# Patient Record
Sex: Female | Born: 1955 | Race: White | Hispanic: No | Marital: Married | Smoking: Never smoker
Health system: Southern US, Community
[De-identification: ages and names within clinical notes are randomized; demographics above are authoritative.]

## PROBLEM LIST (undated history)

## (undated) DIAGNOSIS — E785 Hyperlipidemia, unspecified: Secondary | ICD-10-CM

## (undated) DIAGNOSIS — D649 Anemia, unspecified: Secondary | ICD-10-CM

## (undated) DIAGNOSIS — D759 Disease of blood and blood-forming organs, unspecified: Secondary | ICD-10-CM

## (undated) DIAGNOSIS — C801 Malignant (primary) neoplasm, unspecified: Secondary | ICD-10-CM

## (undated) DIAGNOSIS — Z923 Personal history of irradiation: Secondary | ICD-10-CM

## (undated) DIAGNOSIS — F419 Anxiety disorder, unspecified: Secondary | ICD-10-CM

## (undated) DIAGNOSIS — F32A Depression, unspecified: Secondary | ICD-10-CM

## (undated) HISTORY — PX: CHOLECYSTECTOMY: SHX55

## (undated) HISTORY — DX: Hyperlipidemia, unspecified: E78.5

---

## 2004-05-07 ENCOUNTER — Ambulatory Visit (HOSPITAL_COMMUNITY): Admission: RE | Admit: 2004-05-07 | Discharge: 2004-05-07 | Payer: Self-pay | Admitting: Gastroenterology

## 2004-06-04 ENCOUNTER — Observation Stay (HOSPITAL_COMMUNITY): Admission: RE | Admit: 2004-06-04 | Discharge: 2004-06-05 | Payer: Self-pay | Admitting: General Surgery

## 2012-03-04 ENCOUNTER — Telehealth: Payer: Self-pay

## 2012-03-04 ENCOUNTER — Other Ambulatory Visit (HOSPITAL_COMMUNITY): Payer: Self-pay | Admitting: Obstetrics

## 2012-03-04 DIAGNOSIS — Z139 Encounter for screening, unspecified: Secondary | ICD-10-CM

## 2012-03-04 NOTE — Telephone Encounter (Signed)
LMOM to call.

## 2012-03-05 ENCOUNTER — Other Ambulatory Visit: Payer: Self-pay

## 2012-03-05 DIAGNOSIS — Z139 Encounter for screening, unspecified: Secondary | ICD-10-CM

## 2012-03-05 MED ORDER — PEG 3350-KCL-NA BICARB-NACL 420 G PO SOLR: ORAL | Status: AC

## 2012-03-05 NOTE — Telephone Encounter (Signed)
OK to proceed with colonoscopy.

## 2012-03-05 NOTE — Telephone Encounter (Signed)
Paper Rx and instructions mailed to pt. ( The electronic Rx was for trial to see if the machine was working.)

## 2012-03-05 NOTE — Telephone Encounter (Signed)
Gastroenterology Pre-Procedure Form   Request Date: 04/02/2012          Requesting Physician: Noland Fordyce, MD at [facility]     PATIENT INFORMATION:  Kelly Thomas is a 56 y.o., female (DOB=Aug 16, 1956).  PROCEDURE: Procedure(s) requested: colonoscopy Procedure Reason: screening for colon cancer  PATIENT REVIEW QUESTIONS: The patient reports the following:   1. Diabetes Melitis: no 2. Joint replacements in the past 12 months: no 3. Major health problems in the past 3 months: no 4. Has an artificial valve or MVP:no 5. Has been advised in past to take antibiotics in advance of a procedure like teeth cleaning: no}    MEDICATIONS & ALLERGIES:    Patient reports the following regarding taking any blood thinners:   Plavix? no Aspirin?no Coumadin?  no  Patient confirms/reports the following medications:  Current Outpatient Prescriptions  Medication Sig Dispense Refill  . NON FORMULARY Calcium 600 mg plus Vit D      One tablet daily        Patient confirms/reports the following allergies:  No Known Allergies  Patient is appropriate to schedule for requested procedure(s): yes  AUTHORIZATION INFORMATION Primary Insurance:   ID #:   Group #:  Pre-Cert / Auth required: Pre-Cert / Auth #:   Secondary Insurance:   ID #:  Group #:  Pre-Cert / Auth required:  Pre-Cert / Auth #:   No orders of the defined types were placed in this encounter.    SCHEDULE INFORMATION: Procedure has been scheduled as follows:  Date: 04/02/2012       Time: 11:30 AM  Location: [facility] Short Stay   This Gastroenterology Pre-Precedure Form is being routed to the following provider(s) for review: R. Roetta Sessions, MD

## 2012-03-09 ENCOUNTER — Ambulatory Visit (HOSPITAL_COMMUNITY)
Admission: RE | Admit: 2012-03-09 | Discharge: 2012-03-09 | Disposition: A | Discharge status: Home / Self Care | Payer: 59 | Source: Ambulatory Visit | Attending: Obstetrics | Admitting: Obstetrics

## 2012-03-09 DIAGNOSIS — Z139 Encounter for screening, unspecified: Secondary | ICD-10-CM

## 2012-03-09 DIAGNOSIS — Z1231 Encounter for screening mammogram for malignant neoplasm of breast: Secondary | ICD-10-CM | POA: Insufficient documentation

## 2012-03-31 ENCOUNTER — Encounter (HOSPITAL_COMMUNITY): Payer: Self-pay | Admitting: Pharmacy Technician

## 2012-04-01 MED ORDER — SODIUM CHLORIDE 0.45 % IV SOLN
Freq: Once | INTRAVENOUS | Status: AC
  Administered 2012-04-02: 1000 mL via INTRAVENOUS

## 2012-04-02 ENCOUNTER — Encounter (HOSPITAL_COMMUNITY): Payer: Self-pay | Admitting: *Deleted

## 2012-04-02 ENCOUNTER — Ambulatory Visit (HOSPITAL_COMMUNITY)
Admission: RE | Admit: 2012-04-02 | Discharge: 2012-04-02 | Disposition: A | Discharge status: Home / Self Care | Payer: 59 | Source: Ambulatory Visit | Attending: Internal Medicine | Admitting: Internal Medicine

## 2012-04-02 ENCOUNTER — Encounter (HOSPITAL_COMMUNITY)
Admission: RE | Disposition: A | Discharge status: Home / Self Care | Payer: Self-pay | Source: Ambulatory Visit | Attending: Internal Medicine

## 2012-04-02 DIAGNOSIS — K5730 Diverticulosis of large intestine without perforation or abscess without bleeding: Secondary | ICD-10-CM

## 2012-04-02 DIAGNOSIS — Z1211 Encounter for screening for malignant neoplasm of colon: Secondary | ICD-10-CM

## 2012-04-02 DIAGNOSIS — Z139 Encounter for screening, unspecified: Secondary | ICD-10-CM

## 2012-04-02 HISTORY — PX: COLONOSCOPY: SHX5424

## 2012-04-02 SURGERY — COLONOSCOPY
Anesthesia: Moderate Sedation

## 2012-04-02 MED ORDER — MEPERIDINE HCL 100 MG/ML IJ SOLN
INTRAMUSCULAR | Status: AC
  Filled 2012-04-02: qty 1

## 2012-04-02 MED ORDER — MIDAZOLAM HCL 5 MG/5ML IJ SOLN
INTRAMUSCULAR | Status: DC | PRN
  Administered 2012-04-02 (×2): 2 mg via INTRAVENOUS
  Administered 2012-04-02: 1 mg via INTRAVENOUS

## 2012-04-02 MED ORDER — STERILE WATER FOR IRRIGATION IR SOLN
Status: DC | PRN
  Administered 2012-04-02: 11:00:00

## 2012-04-02 MED ORDER — MEPERIDINE HCL 100 MG/ML IJ SOLN
INTRAMUSCULAR | Status: DC | PRN
  Administered 2012-04-02: 50 mg via INTRAVENOUS
  Administered 2012-04-02 (×2): 25 mg via INTRAVENOUS

## 2012-04-02 MED ORDER — MIDAZOLAM HCL 5 MG/5ML IJ SOLN
INTRAMUSCULAR | Status: AC
  Filled 2012-04-02: qty 10

## 2012-04-02 NOTE — Discharge Instructions (Addendum)
Colonoscopy Discharge Instructions  Read the instructions outlined below and refer to this sheet in the next few weeks. These discharge instructions provide you with general information on caring for yourself after you leave the hospital. Your doctor may also give you specific instructions. While your treatment has been planned according to the most current medical practices available, unavoidable complications occasionally occur. If you have any problems or questions after discharge, call Dr. Jena Gauss at [phone]. ACTIVITY  You may resume your regular activity, but move at a slower pace for the next 24 hours.   Take frequent rest periods for the next 24 hours.   Walking will help get rid of the air and reduce the bloated feeling in your belly (abdomen).   No driving for 24 hours (because of the medicine (anesthesia) used during the test).    Do not sign any important legal documents or operate any machinery for 24 hours (because of the anesthesia used during the test).  NUTRITION  Drink plenty of fluids.   You may resume your normal diet as instructed by your doctor.   Begin with a light meal and progress to your normal diet. Heavy or fried foods are harder to digest and may make you feel sick to your stomach (nauseated).   Avoid alcoholic beverages for 24 hours or as instructed.  MEDICATIONS  You may resume your normal medications unless your doctor tells you otherwise.  WHAT YOU CAN EXPECT TODAY  Some feelings of bloating in the abdomen.   Passage of more gas than usual.   Spotting of blood in your stool or on the toilet paper.  IF YOU HAD POLYPS REMOVED DURING THE COLONOSCOPY:  No aspirin products for 7 days or as instructed.   No alcohol for 7 days or as instructed.   Eat a soft diet for the next 24 hours.  FINDING OUT THE RESULTS OF YOUR TEST Not all test results are available during your visit. If your test results are not back during the visit, make an appointment  with your caregiver to find out the results. Do not assume everything is normal if you have not heard from your caregiver or the medical facility. It is important for you to follow up on all of your test results.  SEEK IMMEDIATE MEDICAL ATTENTION IF:  You have more than a spotting of blood in your stool.   Your belly is swollen (abdominal distention).   You are nauseated or vomiting.   You have a temperature over 101.   You have abdominal pain or discomfort that is severe or gets worse throughout the day.   Diverticulosis information provided. Repeat screening colonoscopy in 10 years. Diverticulosis is a common condition that develops when small pouches (diverticula) form in the wall of the colon. The risk of diverticulosis increases with age. It happens more often in people who eat a low-fiber diet. Most individuals with diverticulosis have no symptoms. Those individuals with symptoms usually experience abdominal pain, constipation, or loose stools (diarrhea). HOME CARE INSTRUCTIONS   Increase the amount of fiber in your diet as directed by your caregiver or dietician. This may reduce symptoms of diverticulosis.   Your caregiver may recommend taking a dietary fiber supplement.   Drink at least 6 to 8 glasses of water each day to prevent constipation.   Try not to strain when you have a bowel movement.   Your caregiver may recommend avoiding nuts and seeds to prevent complications, although this is still an uncertain benefit.  Only take over-the-counter or prescription medicines for pain, discomfort, or fever as directed by your caregiver.  FOODS WITH HIGH FIBER CONTENT INCLUDE:  Fruits. Apple, peach, pear, tangerine, raisins, prunes.   Vegetables. Brussels sprouts, asparagus, broccoli, cabbage, carrot, cauliflower, romaine lettuce, spinach, summer squash, tomato, winter squash, zucchini.   Starchy Vegetables. Baked beans, kidney beans, lima beans, split peas, lentils, potatoes  (with skin).   Grains. Whole wheat bread, brown rice, bran flake cereal, plain oatmeal, white rice, shredded wheat, bran muffins.  SEEK IMMEDIATE MEDICAL CARE IF:   You develop increasing pain or severe bloating.   You have an oral temperature above 102 F (38.9 C), not controlled by medicine.   You develop vomiting or bowel movements that are bloody or black.  Document Released: 08/01/2004 Document Revised: 10/24/2011 Document Reviewed: 04/04/2010 [facility] Patient Information 2012 [place], [place].

## 2012-04-02 NOTE — H&P (Signed)
  Primary Care Physician:  Lendon Colonel., MD, MD Primary Gastroenterologist:  Dr. Jena Gauss  Pre-Procedure History & Physical: HPI:  ALDENE HENDON is a 56 y.o. female is here for a screening colonoscopy.   First ever average risk screening examination. Patient denies any bowel symptoms. No family history polyps or colon cancer.  History reviewed. No pertinent past medical history.  Past Surgical History  Procedure Date  . Cholecystectomy     Prior to Admission medications   Medication Sig Start Date End Date Taking? Authorizing Provider  calcium-vitamin D (OSCAL WITH D) 500-200 MG-UNIT per tablet Take 1 tablet by mouth daily.   Yes Historical Provider, MD    Allergies as of 03/05/2012  . (No Known Allergies)    History reviewed. No pertinent family history.  History   Social History  . Marital Status: Married    Spouse Name: N/A    Number of Children: N/A  . Years of Education: N/A   Occupational History  . Not on file.   Social History Main Topics  . Smoking status: Never Smoker   . Smokeless tobacco: Not on file  . Alcohol Use: No  . Drug Use: Yes  . Sexually Active: Yes   Other Topics Concern  . Not on file   Social History Narrative  . No narrative on file    Review of Systems: See HPI, otherwise negative ROS  Physical Exam: BP 138/86  Pulse 95  Temp(Src) 98.1 F (36.7 C) (Oral)  Resp 22  Ht 5\' 2"  (1.575 m)  Wt 155 lb (70.308 kg)  BMI 28.35 kg/m2  SpO2 96% General:   Alert,  Well-developed, well-nourished, pleasant and cooperative in NAD Head:  Normocephalic and atraumatic. Eyes:  Sclera clear, no icterus.   Conjunctiva pink. Ears:  Normal auditory acuity. Nose:  No deformity, discharge,  or lesions. Mouth:  No deformity or lesions, dentition normal. Neck:  Supple; no masses or thyromegaly. Lungs:  Clear throughout to auscultation.   No wheezes, crackles, or rhonchi. No acute distress. Heart:  Regular rate and rhythm; no murmurs, clicks,  rubs,  or gallops. Abdomen:  Soft, nontender and nondistended. No masses, hepatosplenomegaly or hernias noted. Normal bowel sounds, without guarding, and without rebound.   Msk:  Symmetrical without gross deformities. Normal posture. Pulses:  Normal pulses noted. Extremities:  Without clubbing or edema. Neurologic:  Alert and  oriented x4;  grossly normal neurologically. Skin:  Intact without significant lesions or rashes. Cervical Nodes:  No significant cervical adenopathy. Psych:  Alert and cooperative. Normal mood and affect.  Impression/Plan: NORRINE BALLESTER is now here to undergo a screening colonoscopy.  First ever average risk screening examination.  The risks, benefits, limitations, imponderables and alternatives regarding colonoscopy have been reviewed with the patient. Questions have been answered. All parties agreeable.

## 2012-04-02 NOTE — Op Note (Signed)
[facility] [address] [place], [place]  [address]  COLONOSCOPY PROCEDURE REPORT  PATIENT:  Kelly Thomas, Kelly Thomas  MR#:  [id number] BIRTHDATE:  [birth year], 56 yrs. old  GENDER:  female ENDOSCOPIST:  R. Roetta Sessions, MD FACP [facility] REF. BY:  Noland Fordyce, M.D. PROCEDURE DATE:  04/02/2012 PROCEDURE:  screening ileocolonoscopy  INDICATIONS:  First ever average risk screening examination  INFORMED CONSENT:  The risks, benefits, alternatives and imponderables including but not limited to bleeding, perforation as well as the possibility of a missed lesion have been reviewed. The potential for biopsy, lesion removal, etc. have also been discussed.  Questions have been answered.  All parties agreeable. Please see the history and physical in the medical record for more information.  MEDICATIONS:  Versed 5 mg IV and Demerol 100 mg IV in divided doses.  DESCRIPTION OF PROCEDURE:  After a digital rectal exam was performed, the EC-[id number] ([id number]) colonoscope was advanced from the anus through the rectum and colon to the area of the cecum, ileocecal valve and appendiceal orifice.  The cecum was deeply intubated.  These structures were well-seen and photographed for the record.  From the level of the cecum and ileocecal valve, the scope was slowly and cautiously withdrawn.  The mucosal surfaces were carefully surveyed utilizing scope tip deflection to facilitate fold flattening as needed.  The scope was pulled down into the rectum where a thorough examination including retroflexion was performed. <<PROCEDUREIMAGES>>  FINDINGS:  adequate preparation. Normal rectum. Scattered sigmoid and descending diverticula; remainder of the colonic mucosa and distal 5 cm of terminal ileum appeared normal.  THERAPEUTIC / DIAGNOSTIC MANEUVERS PERFORMED:  none  COMPLICATIONS:   none  CECAL WITHDRAWAL TIME:    8 minutes  IMPRESSION:    Colonic diverticulosis  RECOMMENDATIONS:   Repeat  screening colonoscopy in 10 years  ______________________________ R. Roetta Sessions, MD Caleen Essex  CC:  Noland Fordyce, M.D.  n. eSIGNED:   R. Roetta Sessions at 04/02/2012 11:45 AM  Verl Blalock, [phone]

## 2012-04-06 ENCOUNTER — Encounter (HOSPITAL_COMMUNITY): Payer: Self-pay | Admitting: Internal Medicine

## 2013-03-18 ENCOUNTER — Other Ambulatory Visit (HOSPITAL_COMMUNITY): Payer: Self-pay | Admitting: Obstetrics

## 2013-03-18 DIAGNOSIS — Z139 Encounter for screening, unspecified: Secondary | ICD-10-CM

## 2013-03-23 ENCOUNTER — Ambulatory Visit (HOSPITAL_COMMUNITY)
Admission: RE | Admit: 2013-03-23 | Discharge: 2013-03-23 | Disposition: A | Discharge status: Home / Self Care | Payer: 59 | Source: Ambulatory Visit | Attending: Obstetrics | Admitting: Obstetrics

## 2013-03-23 DIAGNOSIS — Z139 Encounter for screening, unspecified: Secondary | ICD-10-CM

## 2013-03-23 DIAGNOSIS — Z1231 Encounter for screening mammogram for malignant neoplasm of breast: Secondary | ICD-10-CM | POA: Insufficient documentation

## 2013-03-24 ENCOUNTER — Other Ambulatory Visit: Payer: Self-pay | Admitting: Obstetrics

## 2013-03-24 DIAGNOSIS — R928 Other abnormal and inconclusive findings on diagnostic imaging of breast: Secondary | ICD-10-CM

## 2013-04-07 ENCOUNTER — Ambulatory Visit (HOSPITAL_COMMUNITY)
Admission: RE | Admit: 2013-04-07 | Discharge: 2013-04-07 | Disposition: A | Discharge status: Home / Self Care | Payer: 59 | Source: Ambulatory Visit | Attending: Obstetrics | Admitting: Obstetrics

## 2013-04-07 DIAGNOSIS — R928 Other abnormal and inconclusive findings on diagnostic imaging of breast: Secondary | ICD-10-CM

## 2014-02-16 ENCOUNTER — Ambulatory Visit (HOSPITAL_COMMUNITY)
Admission: RE | Admit: 2014-02-16 | Discharge: 2014-02-16 | Disposition: A | Discharge status: Home / Self Care | Payer: 59 | Source: Ambulatory Visit | Attending: Family Medicine | Admitting: Family Medicine

## 2014-02-16 ENCOUNTER — Other Ambulatory Visit (HOSPITAL_COMMUNITY): Payer: Self-pay | Admitting: Family Medicine

## 2014-02-16 DIAGNOSIS — M545 Low back pain, unspecified: Secondary | ICD-10-CM | POA: Insufficient documentation

## 2014-02-16 DIAGNOSIS — M47817 Spondylosis without myelopathy or radiculopathy, lumbosacral region: Secondary | ICD-10-CM | POA: Insufficient documentation

## 2014-02-16 DIAGNOSIS — M25551 Pain in right hip: Secondary | ICD-10-CM

## 2014-02-16 DIAGNOSIS — M25559 Pain in unspecified hip: Secondary | ICD-10-CM | POA: Insufficient documentation

## 2014-05-18 ENCOUNTER — Other Ambulatory Visit (HOSPITAL_COMMUNITY): Payer: Self-pay | Admitting: Obstetrics

## 2014-05-18 DIAGNOSIS — Z1231 Encounter for screening mammogram for malignant neoplasm of breast: Secondary | ICD-10-CM

## 2014-05-23 ENCOUNTER — Ambulatory Visit (HOSPITAL_COMMUNITY)
Admission: RE | Admit: 2014-05-23 | Discharge: 2014-05-23 | Disposition: A | Discharge status: Home / Self Care | Payer: 59 | Source: Ambulatory Visit | Attending: Obstetrics | Admitting: Obstetrics

## 2014-05-23 DIAGNOSIS — Z1231 Encounter for screening mammogram for malignant neoplasm of breast: Secondary | ICD-10-CM

## 2014-11-09 ENCOUNTER — Emergency Department (HOSPITAL_COMMUNITY)
Admission: EM | Admit: 2014-11-09 | Discharge: 2014-11-09 | Disposition: A | Discharge status: Home / Self Care | Payer: 59 | Attending: Emergency Medicine | Admitting: Emergency Medicine

## 2014-11-09 ENCOUNTER — Emergency Department (HOSPITAL_COMMUNITY): Payer: 59

## 2014-11-09 ENCOUNTER — Encounter (HOSPITAL_COMMUNITY): Payer: Self-pay

## 2014-11-09 DIAGNOSIS — M5416 Radiculopathy, lumbar region: Secondary | ICD-10-CM

## 2014-11-09 DIAGNOSIS — M79604 Pain in right leg: Secondary | ICD-10-CM | POA: Diagnosis not present

## 2014-11-09 DIAGNOSIS — M25551 Pain in right hip: Secondary | ICD-10-CM | POA: Diagnosis present

## 2014-11-09 DIAGNOSIS — R52 Pain, unspecified: Secondary | ICD-10-CM

## 2014-11-09 LAB — CBC WITH DIFFERENTIAL/PLATELET
BASOS PCT: 0 % (ref 0–1)
Basophils Absolute: 0 10*3/uL (ref 0.0–0.1)
EOS ABS: 0 10*3/uL (ref 0.0–0.7)
EOS PCT: 0 % (ref 0–5)
HCT: 39.3 % (ref 36.0–46.0)
HEMOGLOBIN: 13.1 g/dL (ref 12.0–15.0)
LYMPHS ABS: 1.1 10*3/uL (ref 0.7–4.0)
Lymphocytes Relative: 13 % (ref 12–46)
MCH: 30.7 pg (ref 26.0–34.0)
MCHC: 33.3 g/dL (ref 30.0–36.0)
MCV: 92 fL (ref 78.0–100.0)
MONO ABS: 0.2 10*3/uL (ref 0.1–1.0)
MONOS PCT: 2 % — AB (ref 3–12)
NEUTROS PCT: 85 % — AB (ref 43–77)
Neutro Abs: 6.7 10*3/uL (ref 1.7–7.7)
Platelets: 259 10*3/uL (ref 150–400)
RBC: 4.27 MIL/uL (ref 3.87–5.11)
RDW: 13.1 % (ref 11.5–15.5)
WBC: 8 10*3/uL (ref 4.0–10.5)

## 2014-11-09 LAB — BASIC METABOLIC PANEL
Anion gap: 5 (ref 5–15)
BUN: 13 mg/dL (ref 6–23)
CHLORIDE: 106 meq/L (ref 96–112)
CO2: 27 mmol/L (ref 19–32)
CREATININE: 0.98 mg/dL (ref 0.50–1.10)
Calcium: 9.1 mg/dL (ref 8.4–10.5)
GFR calc non Af Amer: 63 mL/min — ABNORMAL LOW (ref >90)
GFR, EST AFRICAN AMERICAN: 73 mL/min — AB (ref >90)
GLUCOSE: 130 mg/dL — AB (ref 70–99)
Potassium: 3.9 mmol/L (ref 3.5–5.1)
Sodium: 138 mmol/L (ref 135–145)

## 2014-11-09 MED ORDER — HYDROMORPHONE HCL 1 MG/ML IJ SOLN
1.0000 mg | Freq: Once | INTRAMUSCULAR | Status: AC
  Administered 2014-11-09: 1 mg via INTRAVENOUS
  Filled 2014-11-09: qty 1

## 2014-11-09 MED ORDER — METHOCARBAMOL 1000 MG/10ML IJ SOLN
INTRAMUSCULAR | Status: AC
  Filled 2014-11-09: qty 10

## 2014-11-09 MED ORDER — ONDANSETRON HCL 4 MG/2ML IJ SOLN
4.0000 mg | Freq: Once | INTRAMUSCULAR | Status: AC
  Administered 2014-11-09: 4 mg via INTRAVENOUS
  Filled 2014-11-09: qty 2

## 2014-11-09 MED ORDER — IBUPROFEN 800 MG PO TABS
800.0000 mg | ORAL_TABLET | Freq: Once | ORAL | Status: AC
  Administered 2014-11-09: 800 mg via ORAL
  Filled 2014-11-09: qty 1

## 2014-11-09 MED ORDER — CYCLOBENZAPRINE HCL 10 MG PO TABS
10.0000 mg | ORAL_TABLET | Freq: Once | ORAL | Status: AC
  Administered 2014-11-09: 10 mg via ORAL
  Filled 2014-11-09: qty 1

## 2014-11-09 MED ORDER — OXYCODONE-ACETAMINOPHEN 5-325 MG PO TABS: 1.0000 | ORAL_TABLET | Freq: Four times a day (QID) | ORAL | Status: DC | PRN

## 2014-11-09 MED ORDER — METHOCARBAMOL 1000 MG/10ML IJ SOLN
1000.0000 mg | Freq: Once | INTRAMUSCULAR | Status: AC
  Administered 2014-11-09: 1000 mg via INTRAVENOUS
  Filled 2014-11-09: qty 10

## 2014-11-09 MED ORDER — IBUPROFEN 800 MG PO TABS: 800.0000 mg | ORAL_TABLET | Freq: Three times a day (TID) | ORAL | Status: DC

## 2014-11-09 MED ORDER — OXYCODONE-ACETAMINOPHEN 5-325 MG PO TABS
1.0000 | ORAL_TABLET | Freq: Once | ORAL | Status: AC
Start: 2014-11-09 — End: 2014-11-09
  Administered 2014-11-09: 1 via ORAL
  Filled 2014-11-09: qty 1

## 2014-11-09 MED ORDER — OXYCODONE-ACETAMINOPHEN 5-325 MG PO TABS
1.0000 | ORAL_TABLET | Freq: Once | ORAL | Status: AC
  Administered 2014-11-09: 1 via ORAL
  Filled 2014-11-09: qty 1

## 2014-11-09 NOTE — Discharge Instructions (Signed)
Sciatica Call Dr.Botero's office today to schedule an appointment. His office may want a referral from your primary care physician. Take the medication prescribed as needed for pain. Ibuprofen will help with inflammation. Take ibuprofen with food. If you need refills for prescriptions,  Please Call Dr. Lorenso CourierMcinnis's office. Sciatica is pain, weakness, numbness, or tingling along your sciatic nerve. The nerve starts in the lower back and runs down the back of each leg. Nerve damage or certain conditions pinch or put pressure on the sciatic nerve. This causes the pain, weakness, and other discomforts of sciatica. HOME CARE   Only take medicine as told by your doctor.  Apply ice to the affected area for 20 minutes. Do this 3-4 times a day for the first 48-72 hours. Then try heat in the same way.  Exercise, stretch, or do your usual activities if these do not make your pain worse.  Go to physical therapy as told by your doctor.  Keep all doctor visits as told.  Do not wear high heels or shoes that are not supportive.  Get a firm mattress if your mattress is too soft to lessen pain and discomfort. GET HELP RIGHT AWAY IF:   You cannot control when you poop (bowel movement) or pee (urinate).  You have more weakness in your lower back, lower belly (pelvis), butt (buttocks), or legs.  You have redness or puffiness (swelling) of your back.  You have a burning feeling when you pee.  You have pain that gets worse when you lie down.  You have pain that wakes you from your sleep.  Your pain is worse than past pain.  Your pain lasts longer than 4 weeks.  You are suddenly losing weight without reason. MAKE SURE YOU:   Understand these instructions.  Will watch this condition.  Will get help right away if you are not doing well or get worse. Document Released: 08/13/2008 Document Revised: 05/05/2012 Document Reviewed: 03/15/2012 [facility] Patient Information 2015 [place]. This  information is not intended to replace advice given to you by your health care provider. Make sure you discuss any questions you have with your health care provider.

## 2014-11-09 NOTE — ED Notes (Signed)
Pt bent over to pick up the vacuum cleaner yesterday and "felt something happen in my right hip"  Pt denies feeling a pop or a pull, but has been having severe pain from right hip and down right anterior thigh.

## 2014-11-09 NOTE — ED Provider Notes (Signed)
CSN: [id number]     Arrival date & time 11/09/14  0227 History   First MD Initiated Contact with Patient 11/09/14 0345     Chief Complaint  Patient presents with  . Hip Pain     (Consider location/radiation/quality/duration/timing/severity/associated sxs/prior Treatment) Patient is a 58 y.o. female presenting with hip pain. The history is provided by the patient.  Hip Pain  She had sudden onset of pain in the right hip radiating to the right thigh. This occurred yesterday when she bent over to pick up a vacuum cleaner. Pain is severe and she rates it 10/10. She describes the pain as sharp. Nothing makes it better nothing makes it worse and she cannot get comfortable. She denies any weakness, numbness, telling. She denies bowel or bladder dysfunction. 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 She is not taken anything at home for pain.  History reviewed. No pertinent past medical history. Past Surgical History  Procedure Laterality Date  . Cholecystectomy    . Colonoscopy  04/02/2012    Procedure: COLONOSCOPY;  Surgeon: Corbin Adeobert M Rourk, MD;  Location: [facility];  Service: Endoscopy;  Laterality: N/A;  11:30 AM   No family history on file. History  Substance Use Topics  . Smoking status: Never Smoker   . Smokeless tobacco: Not on file  . Alcohol Use: No   OB History    No data available     Review of Systems  All other systems reviewed and are negative.     Allergies  Review of patient's allergies indicates no known allergies.  Home Medications   Prior to Admission medications   Medication Sig Start Date End Date Taking? Authorizing Provider  calcium-vitamin D (OSCAL WITH D) 500-200 MG-UNIT per tablet Take 1 tablet by mouth daily.   Yes Historical Provider, MD   BP 135/83 mmHg  Pulse 83  Temp(Src) 98.2 F (36.8 C) (Oral)  Resp 20  Ht 5\' 5"  (1.651 m)  Wt 150 lb (68.04 kg)  BMI 24.96 kg/m2  SpO2 100% Physical  Exam  Nursing note and vitals reviewed.  58 year old female, who is uncomfortable and in pain, but his in no acute distress. Vital signs are normal. Oxygen saturation is 100%, which is normal. Head is normocephalic and atraumatic. PERRLA, EOMI. Oropharynx is clear. Neck is nontender and supple without adenopathy or JVD. Back is nontender and there is no CVA tenderness. Mild right paralumbar spasm is present. Straight leg raise is positive on the right at 60. Lungs are clear without rales, wheezes, or rhonchi. Chest is nontender. Heart has regular rate and rhythm without murmur. Abdomen is soft, flat, nontender without masses or hepatosplenomegaly and peristalsis is normoactive. Extremities have no cyanosis or edema, full range of motion is present. There is mild tenderness to palpation in the right hemipelvis and hip area without any point tenderness. Skin is warm and dry without rash. Neurologic: Mental status is normal, cranial nerves are intact, there are no motor or sensory deficits.  ED Course  Procedures (including critical care time)  Imaging Review Dg Hip Complete Right  11/09/2014   CLINICAL DATA:  Severe right hip pain, acute onset. Initial encounter.  EXAM: RIGHT HIP - COMPLETE 2+ VIEW  COMPARISON:  Right hip radiograph performed 02/16/2014  FINDINGS: There is no evidence of fracture or dislocation. Both femoral heads are seated normally within their respective acetabula. The proximal right femur appears intact. No  significant degenerative change is appreciated. The sacroiliac joints are unremarkable in appearance.  The visualized bowel gas pattern is grossly unremarkable in appearance. Scattered phleboliths are noted within the pelvis.  IMPRESSION: No evidence of fracture or dislocation. No radiographic evidence for osteoarthritis.   Electronically Signed   By: Roanna RaiderJeffery  Chang M.D.   On: 11/09/2014 03:52   Images viewed by me.  MDM   Final diagnoses:  Pain  Leg pain,  anterior, right    Right hip and thigh pain in pattern suggestive of sciatica. No evidence of fracture on x-ray and full passive range of motion is present in the hip without significant pain. She is given a dose of oxycodone acetaminophen, cyclobenzaprine, and ibuprofen and will be reassessed.  She had no relief with above noted medication. Oxycodone acetaminophen was repeated with no relief. IV was started and she was given hydromorphone for pain with only modest relief. Hydromorphone was repeated with fairly good relief from. She was also given intravenous methocarbamol. Because of difficulty in achieving pain control, was elected to send her for MRI scan. Case is endorsed to Dr. Ethelda ChickJacubowitz to evaluate results  Dione Boozeavid Kamille Toomey, MD 11/09/14 [phone]

## 2014-11-09 NOTE — ED Notes (Signed)
Pt at MRI

## 2014-11-09 NOTE — ED Provider Notes (Signed)
8:30 AM patient's pain is much improved after treatment with intravenous opioids she feels ready to go home. She is able to walk with slight limp favoring right leg. Plan prescription ibuprofen, Percocet referral Dr.Botero Results for orders placed or performed during the hospital encounter of 11/09/14  Basic metabolic panel  Result Value Ref Range   Sodium 138 135 - 145 mmol/L   Potassium 3.9 3.5 - 5.1 mmol/L   Chloride 106 96 - 112 mEq/L   CO2 27 19 - 32 mmol/L   Glucose, Bld 130 (H) 70 - 99 mg/dL   BUN 13 6 - 23 mg/dL   Creatinine, Ser 1.610.98 0.50 - 1.10 mg/dL   Calcium 9.1 8.4 - 09.610.5 mg/dL   GFR calc non Af Amer 63 (L) >90 mL/min   GFR calc Af Amer 73 (L) >90 mL/min   Anion gap 5 5 - 15  CBC with Differential  Result Value Ref Range   WBC 8.0 4.0 - 10.5 K/uL   RBC 4.27 3.87 - 5.11 MIL/uL   Hemoglobin 13.1 12.0 - 15.0 g/dL   HCT 04.539.3 40.936.0 - 81.146.0 %   MCV 92.0 78.0 - 100.0 fL   MCH 30.7 26.0 - 34.0 pg   MCHC 33.3 30.0 - 36.0 g/dL   RDW 91.413.1 78.211.5 - 95.615.5 %   Platelets 259 150 - 400 K/uL   Neutrophils Relative % 85 (H) 43 - 77 %   Neutro Abs 6.7 1.7 - 7.7 K/uL   Lymphocytes Relative 13 12 - 46 %   Lymphs Abs 1.1 0.7 - 4.0 K/uL   Monocytes Relative 2 (L) 3 - 12 %   Monocytes Absolute 0.2 0.1 - 1.0 K/uL   Eosinophils Relative 0 0 - 5 %   Eosinophils Absolute 0.0 0.0 - 0.7 K/uL   Basophils Relative 0 0 - 1 %   Basophils Absolute 0.0 0.0 - 0.1 K/uL   Dg Hip Complete Right  11/09/2014   CLINICAL DATA:  Severe right hip pain, acute onset. Initial encounter.  EXAM: RIGHT HIP - COMPLETE 2+ VIEW  COMPARISON:  Right hip radiograph performed 02/16/2014  FINDINGS: There is no evidence of fracture or dislocation. Both femoral heads are seated normally within their respective acetabula. The proximal right femur appears intact. No significant degenerative change is appreciated. The sacroiliac joints are unremarkable in appearance.  The visualized bowel gas pattern is grossly unremarkable in  appearance. Scattered phleboliths are noted within the pelvis.  IMPRESSION: No evidence of fracture or dislocation. No radiographic evidence for osteoarthritis.   Electronically Signed   By: Roanna RaiderJeffery  Chang M.D.   On: 11/09/2014 03:52   Mr Lumbar Spine Wo Contrast  11/09/2014   CLINICAL DATA:  Acute onset of severe right hip and anterior leg pain yesterday.  EXAM: MRI LUMBAR SPINE WITHOUT CONTRAST  TECHNIQUE: Multiplanar, multisequence MR imaging of the lumbar spine was performed. No intravenous contrast was administered.  COMPARISON:  Lumbar spine radiographs 02/16/2014. CT abdomen and pelvis 05/07/2004.  FINDINGS: Lowest fully formed intervertebral disc space is labeled L5-S1.  Vertebral alignment is within normal limits. Vertebral body heights are preserved. Right-sided S1 hemangioma is noted. 5 mm T2 hyperintense focus in the anterior L5 vertebral body may represent an atypical hemangioma. There is no evidence of significant vertebral marrow edema. Diffuse lumbar disc desiccation is present. Conus medullaris is normal in signal and terminates at L1. Incidental note is made of a circumaortic left renal vein.  L1-2:  Negative.  L2-3: Small right foraminal disc  extrusion results in moderate right neural foraminal stenosis laterally and appears to contact the exiting right L2 nerve root. No spinal canal or left neural foraminal stenosis.  L3-4:  Mild disc bulging without stenosis.  L4-5: Mild disc bulging with left foraminal annular fissure and mild facet hypertrophy result in minimal left neural foraminal narrowing. No spinal canal stenosis.  L5-S1:  Mild facet arthrosis without stenosis.  IMPRESSION: 1. Right foraminal disc extrusion at L2-3 likely affecting the right L2 nerve. 2. 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   Electronically Signed   By: Sebastian AcheAllen  Grady   On: 11/09/2014 08:04   Dx Lumbar radiculpathy   Doug SouSam Donat Humble, MD 11/09/14 [phone]

## 2015-05-18 ENCOUNTER — Other Ambulatory Visit (HOSPITAL_COMMUNITY): Payer: Self-pay | Admitting: Obstetrics

## 2015-05-18 DIAGNOSIS — Z1231 Encounter for screening mammogram for malignant neoplasm of breast: Secondary | ICD-10-CM

## 2015-05-26 ENCOUNTER — Ambulatory Visit (HOSPITAL_COMMUNITY)
Admission: RE | Admit: 2015-05-26 | Discharge: 2015-05-26 | Disposition: A | Discharge status: Home / Self Care | Payer: 59 | Source: Ambulatory Visit | Attending: Obstetrics | Admitting: Obstetrics

## 2015-05-26 DIAGNOSIS — Z1231 Encounter for screening mammogram for malignant neoplasm of breast: Secondary | ICD-10-CM | POA: Diagnosis not present

## 2016-05-28 ENCOUNTER — Other Ambulatory Visit (HOSPITAL_COMMUNITY): Payer: Self-pay | Admitting: Obstetrics

## 2016-05-28 ENCOUNTER — Other Ambulatory Visit: Payer: Self-pay | Admitting: Physical Therapy

## 2016-05-28 DIAGNOSIS — Z1231 Encounter for screening mammogram for malignant neoplasm of breast: Secondary | ICD-10-CM

## 2016-05-30 ENCOUNTER — Ambulatory Visit (HOSPITAL_COMMUNITY)
Admission: RE | Admit: 2016-05-30 | Discharge: 2016-05-30 | Disposition: A | Discharge status: Home / Self Care | Payer: 59 | Source: Ambulatory Visit | Attending: Obstetrics | Admitting: Obstetrics

## 2016-05-30 DIAGNOSIS — Z1231 Encounter for screening mammogram for malignant neoplasm of breast: Secondary | ICD-10-CM | POA: Diagnosis present

## 2018-01-05 DIAGNOSIS — E785 Hyperlipidemia, unspecified: Secondary | ICD-10-CM | POA: Diagnosis not present

## 2018-01-07 DIAGNOSIS — R944 Abnormal results of kidney function studies: Secondary | ICD-10-CM | POA: Diagnosis not present

## 2018-01-07 DIAGNOSIS — Z1331 Encounter for screening for depression: Secondary | ICD-10-CM | POA: Diagnosis not present

## 2018-03-25 ENCOUNTER — Other Ambulatory Visit (HOSPITAL_COMMUNITY): Payer: Self-pay | Admitting: Internal Medicine

## 2018-03-25 DIAGNOSIS — Z1231 Encounter for screening mammogram for malignant neoplasm of breast: Secondary | ICD-10-CM

## 2018-03-30 ENCOUNTER — Ambulatory Visit (HOSPITAL_COMMUNITY)
Admission: RE | Admit: 2018-03-30 | Discharge: 2018-03-30 | Disposition: A | Discharge status: Home / Self Care | Payer: BLUE CROSS/BLUE SHIELD | Source: Ambulatory Visit | Attending: Internal Medicine | Admitting: Internal Medicine

## 2018-03-30 DIAGNOSIS — Z1231 Encounter for screening mammogram for malignant neoplasm of breast: Secondary | ICD-10-CM | POA: Diagnosis not present

## 2018-06-30 DIAGNOSIS — Z1331 Encounter for screening for depression: Secondary | ICD-10-CM | POA: Diagnosis not present

## 2018-06-30 DIAGNOSIS — Z6830 Body mass index (BMI) 30.0-30.9, adult: Secondary | ICD-10-CM | POA: Diagnosis not present

## 2018-06-30 DIAGNOSIS — E785 Hyperlipidemia, unspecified: Secondary | ICD-10-CM | POA: Diagnosis not present

## 2018-06-30 DIAGNOSIS — R944 Abnormal results of kidney function studies: Secondary | ICD-10-CM | POA: Diagnosis not present

## 2018-06-30 DIAGNOSIS — R7301 Impaired fasting glucose: Secondary | ICD-10-CM | POA: Diagnosis not present

## 2018-07-07 DIAGNOSIS — R7301 Impaired fasting glucose: Secondary | ICD-10-CM | POA: Diagnosis not present

## 2018-07-07 DIAGNOSIS — M5430 Sciatica, unspecified side: Secondary | ICD-10-CM | POA: Diagnosis not present

## 2018-07-07 DIAGNOSIS — R944 Abnormal results of kidney function studies: Secondary | ICD-10-CM | POA: Diagnosis not present

## 2018-07-07 DIAGNOSIS — E782 Mixed hyperlipidemia: Secondary | ICD-10-CM | POA: Diagnosis not present

## 2019-01-08 DIAGNOSIS — R944 Abnormal results of kidney function studies: Secondary | ICD-10-CM | POA: Diagnosis not present

## 2019-01-08 DIAGNOSIS — R7301 Impaired fasting glucose: Secondary | ICD-10-CM | POA: Diagnosis not present

## 2019-01-08 DIAGNOSIS — E782 Mixed hyperlipidemia: Secondary | ICD-10-CM | POA: Diagnosis not present

## 2019-01-08 DIAGNOSIS — E785 Hyperlipidemia, unspecified: Secondary | ICD-10-CM | POA: Diagnosis not present

## 2019-01-13 ENCOUNTER — Other Ambulatory Visit (HOSPITAL_COMMUNITY): Payer: Self-pay | Admitting: Internal Medicine

## 2019-01-13 DIAGNOSIS — Z0000 Encounter for general adult medical examination without abnormal findings: Secondary | ICD-10-CM | POA: Diagnosis not present

## 2019-01-13 DIAGNOSIS — M5430 Sciatica, unspecified side: Secondary | ICD-10-CM | POA: Diagnosis not present

## 2019-01-13 DIAGNOSIS — N183 Chronic kidney disease, stage 3 (moderate): Secondary | ICD-10-CM | POA: Diagnosis not present

## 2019-01-13 DIAGNOSIS — Z780 Asymptomatic menopausal state: Secondary | ICD-10-CM

## 2019-01-13 DIAGNOSIS — E782 Mixed hyperlipidemia: Secondary | ICD-10-CM | POA: Diagnosis not present

## 2019-04-30 ENCOUNTER — Other Ambulatory Visit (HOSPITAL_COMMUNITY): Payer: Self-pay | Admitting: Internal Medicine

## 2019-04-30 DIAGNOSIS — Z1231 Encounter for screening mammogram for malignant neoplasm of breast: Secondary | ICD-10-CM

## 2019-05-13 ENCOUNTER — Other Ambulatory Visit: Payer: Self-pay

## 2019-05-13 ENCOUNTER — Encounter (HOSPITAL_COMMUNITY): Payer: Self-pay

## 2019-05-13 ENCOUNTER — Ambulatory Visit (HOSPITAL_COMMUNITY)
Admission: RE | Admit: 2019-05-13 | Discharge: 2019-05-13 | Disposition: A | Discharge status: Home / Self Care | Payer: BC Managed Care – PPO | Source: Ambulatory Visit | Attending: Internal Medicine | Admitting: Internal Medicine

## 2019-05-13 DIAGNOSIS — Z1231 Encounter for screening mammogram for malignant neoplasm of breast: Secondary | ICD-10-CM | POA: Insufficient documentation

## 2020-05-19 ENCOUNTER — Other Ambulatory Visit (HOSPITAL_COMMUNITY): Payer: Self-pay | Admitting: Internal Medicine

## 2020-05-19 DIAGNOSIS — Z1231 Encounter for screening mammogram for malignant neoplasm of breast: Secondary | ICD-10-CM

## 2020-05-26 ENCOUNTER — Other Ambulatory Visit: Payer: Self-pay

## 2020-05-26 ENCOUNTER — Ambulatory Visit (HOSPITAL_COMMUNITY)
Admission: RE | Admit: 2020-05-26 | Discharge: 2020-05-26 | Disposition: A | Discharge status: Home / Self Care | Payer: 59 | Source: Ambulatory Visit | Attending: Internal Medicine | Admitting: Internal Medicine

## 2020-05-26 DIAGNOSIS — Z1231 Encounter for screening mammogram for malignant neoplasm of breast: Secondary | ICD-10-CM

## 2021-07-05 ENCOUNTER — Other Ambulatory Visit (HOSPITAL_COMMUNITY): Payer: Self-pay | Admitting: Internal Medicine

## 2021-07-05 DIAGNOSIS — Z1231 Encounter for screening mammogram for malignant neoplasm of breast: Secondary | ICD-10-CM

## 2021-07-09 ENCOUNTER — Other Ambulatory Visit: Payer: Self-pay

## 2021-07-09 ENCOUNTER — Ambulatory Visit (HOSPITAL_COMMUNITY)
Admission: RE | Admit: 2021-07-09 | Discharge: 2021-07-09 | Disposition: A | Discharge status: Home / Self Care | Payer: 59 | Source: Ambulatory Visit | Attending: Internal Medicine | Admitting: Internal Medicine

## 2021-07-09 DIAGNOSIS — Z1231 Encounter for screening mammogram for malignant neoplasm of breast: Secondary | ICD-10-CM | POA: Diagnosis not present

## 2021-12-08 IMAGING — MG DIGITAL SCREENING BILAT W/ TOMO W/ CAD
2 series · 3 of 6 positions shown · non-contrast
Comparison: Previous exam(s).

CLINICAL DATA: Screening.

EXAM:
DIGITAL SCREENING BILATERAL MAMMOGRAM WITH TOMO AND CAD

[L CC synth-2D]
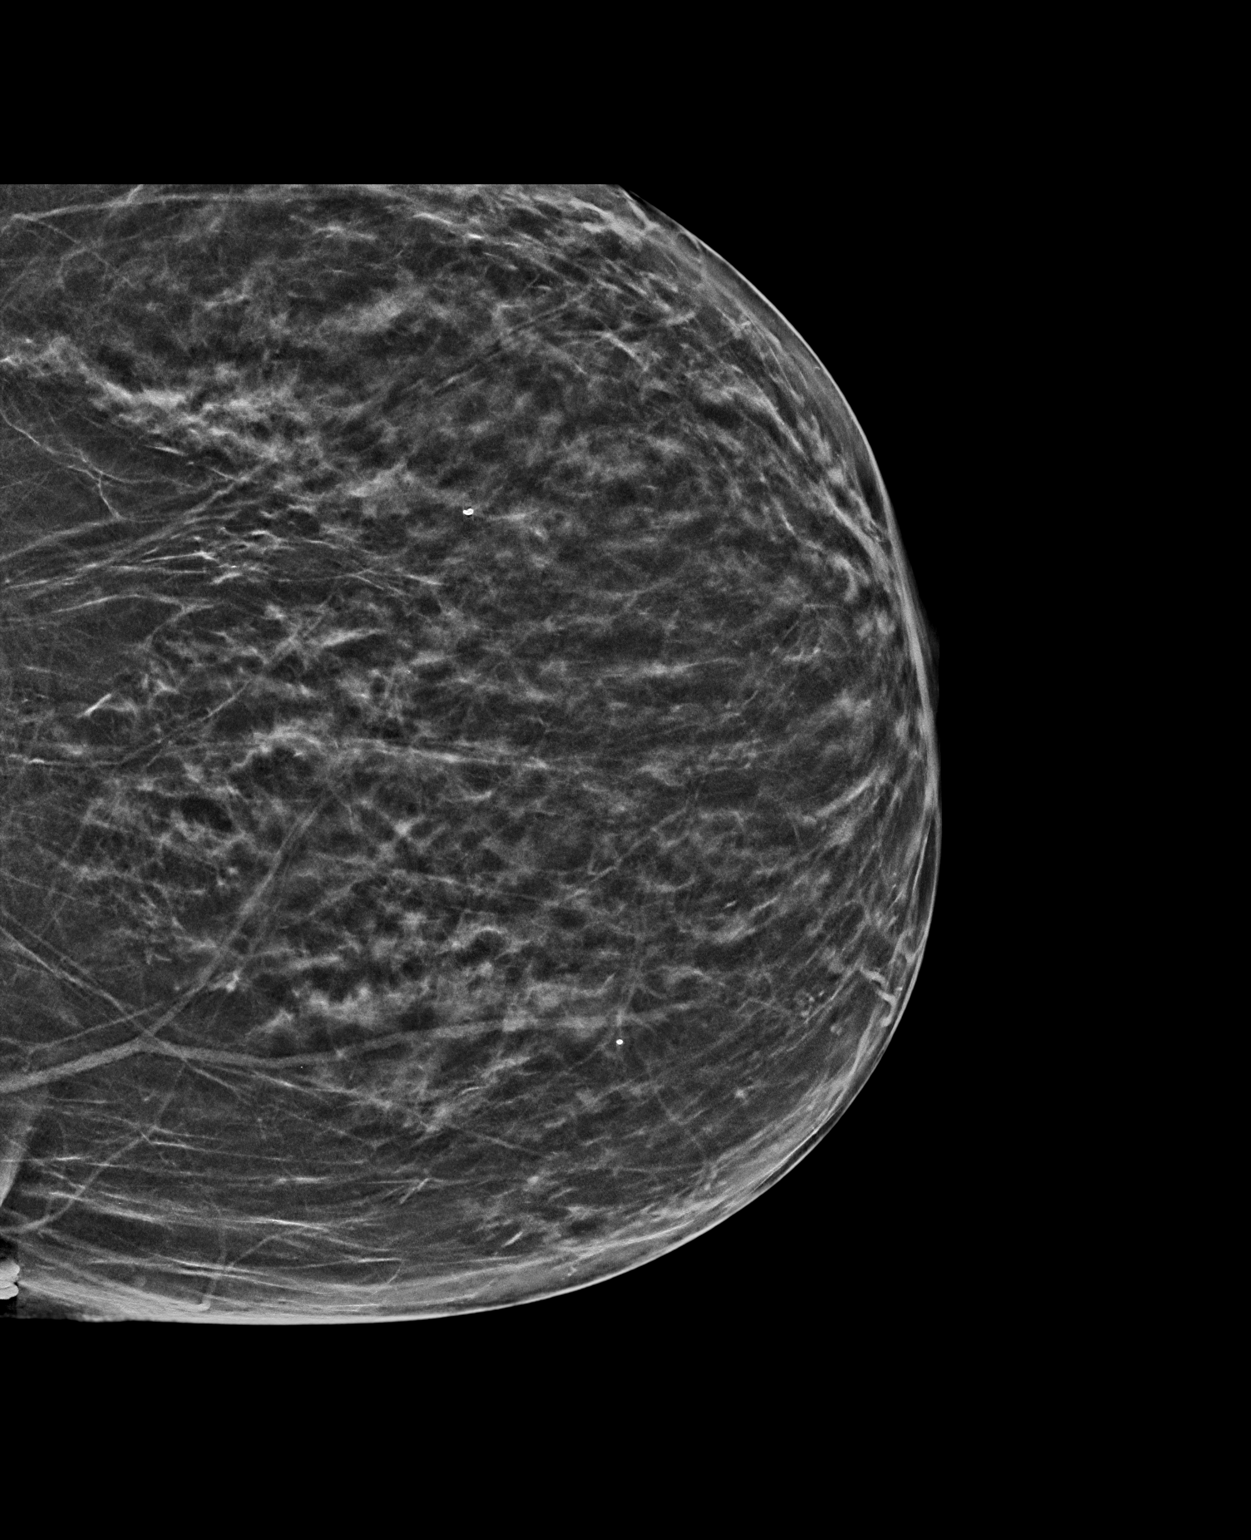

[R CC tomo · 2 of 55 frames shown]
[frame 18/55]
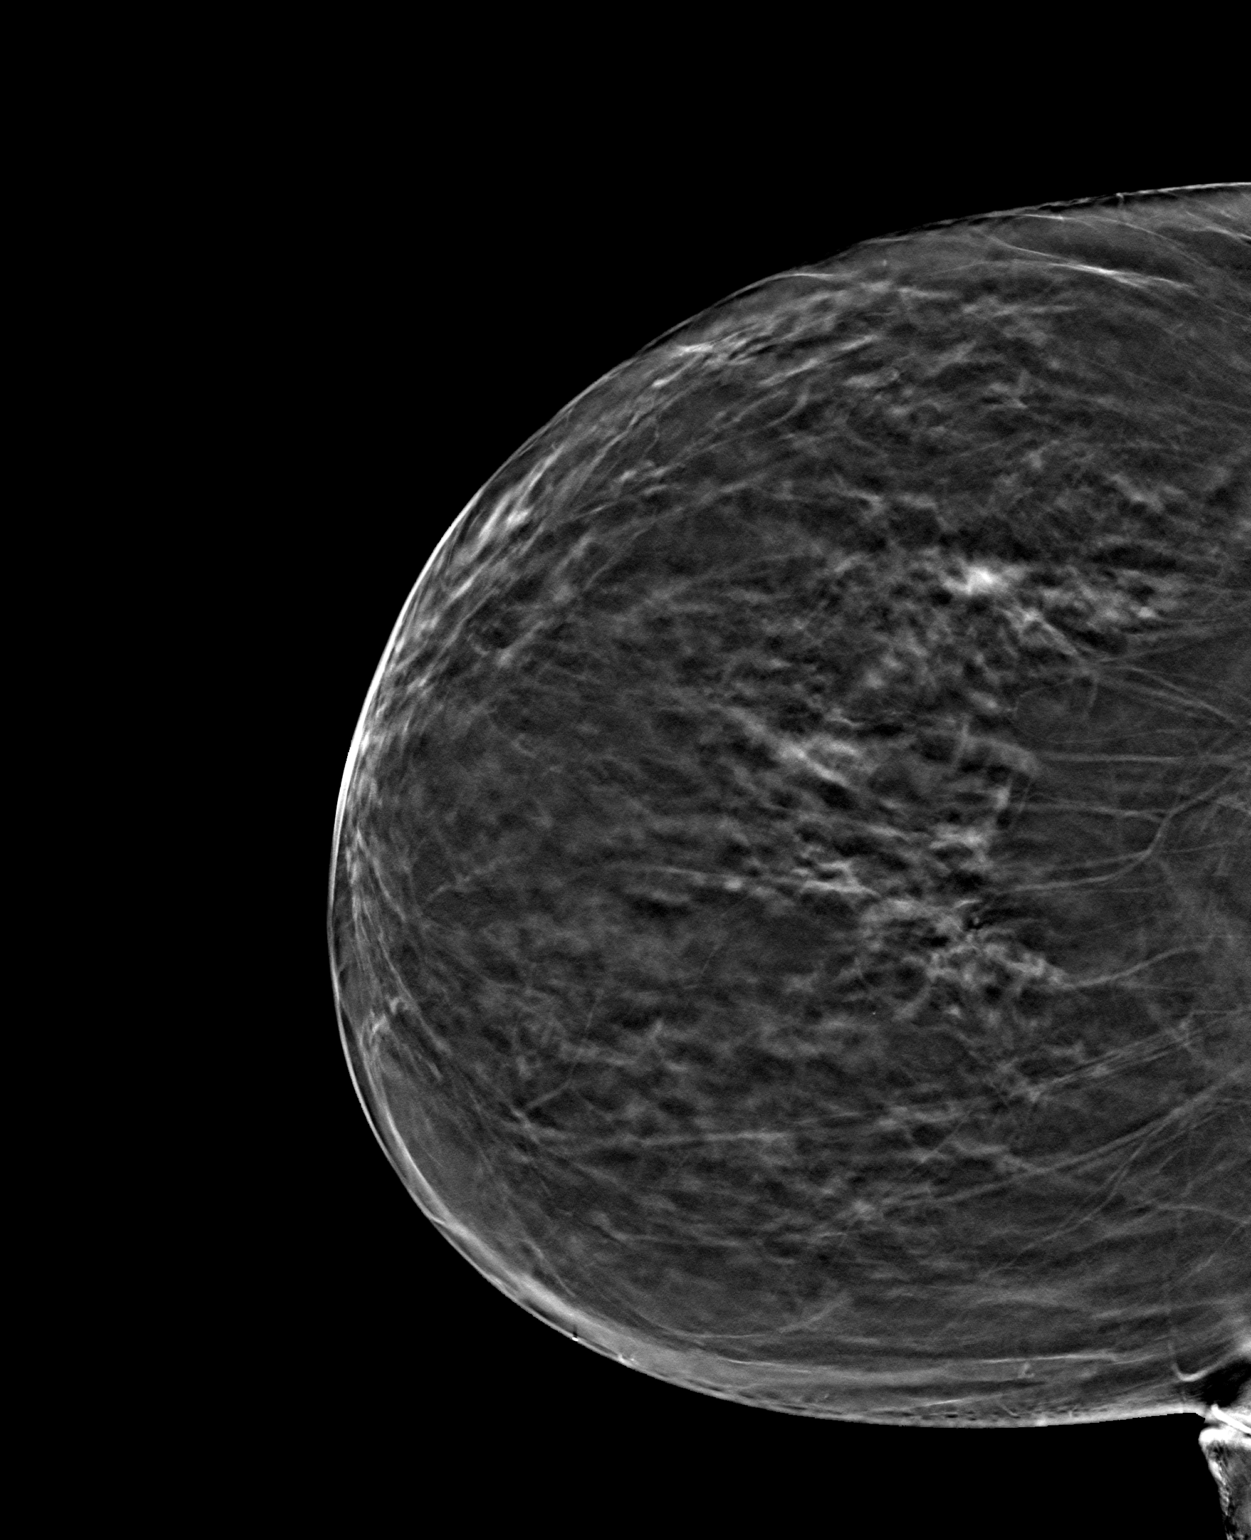
[frame 28/55]
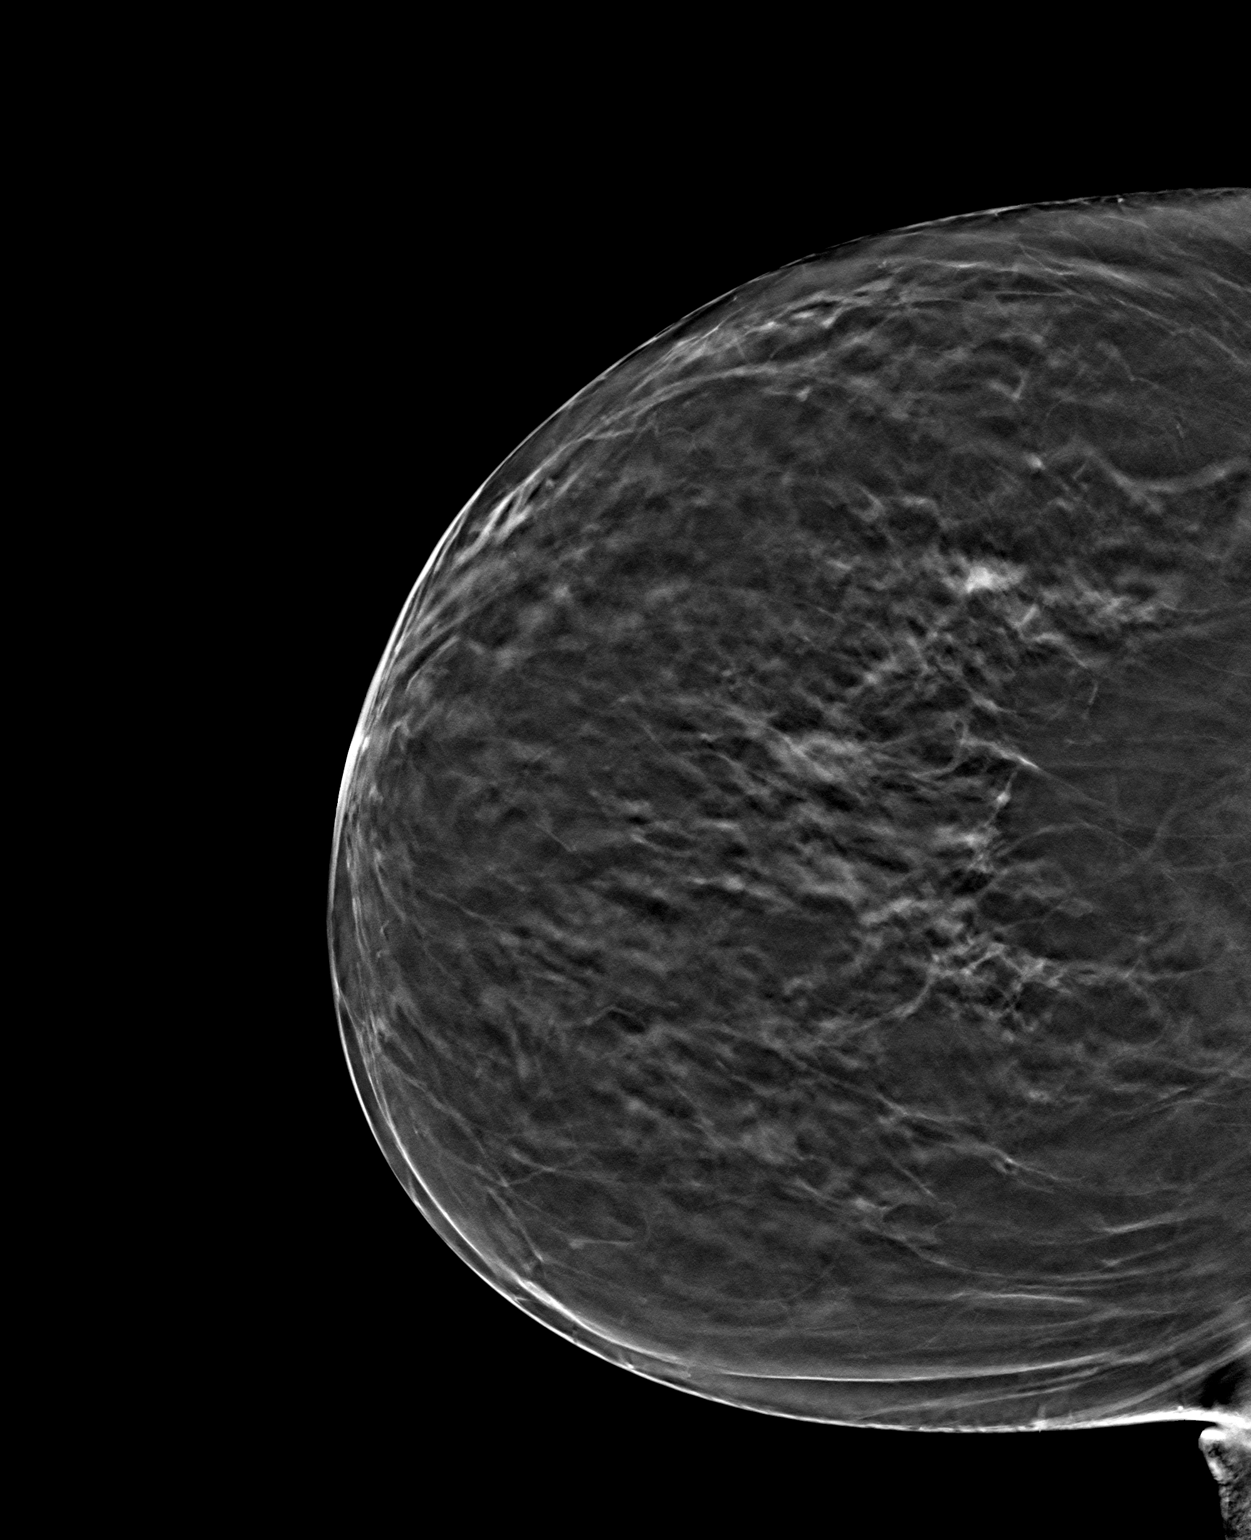

[3 of 6 positions shown; findings below may reference images not displayed]

ACR Breast Density Category b: There are scattered areas of
fibroglandular density.
FINDINGS: There are no findings suspicious for malignancy. Images were
processed with CAD.
IMPRESSION: No mammographic evidence of malignancy. A result letter of this
screening mammogram will be mailed directly to the patient.

RECOMMENDATION:
Screening mammogram in one year. (Code:[id number])

BI-RADS CATEGORY  1: Negative.

## 2022-03-05 ENCOUNTER — Encounter: Payer: Self-pay | Admitting: *Deleted

## 2022-04-10 ENCOUNTER — Other Ambulatory Visit (HOSPITAL_COMMUNITY): Payer: Self-pay | Admitting: Nurse Practitioner

## 2022-04-10 DIAGNOSIS — Z13820 Encounter for screening for osteoporosis: Secondary | ICD-10-CM

## 2022-04-18 ENCOUNTER — Ambulatory Visit (HOSPITAL_COMMUNITY)
Admission: RE | Admit: 2022-04-18 | Discharge: 2022-04-18 | Disposition: A | Discharge status: Home / Self Care | Payer: Medicare Other | Source: Ambulatory Visit | Attending: Nurse Practitioner | Admitting: Nurse Practitioner

## 2022-04-18 DIAGNOSIS — Z780 Asymptomatic menopausal state: Secondary | ICD-10-CM | POA: Insufficient documentation

## 2022-04-18 DIAGNOSIS — M810 Age-related osteoporosis without current pathological fracture: Secondary | ICD-10-CM | POA: Diagnosis not present

## 2022-04-18 DIAGNOSIS — Z13820 Encounter for screening for osteoporosis: Secondary | ICD-10-CM | POA: Insufficient documentation

## 2022-07-01 ENCOUNTER — Other Ambulatory Visit (HOSPITAL_COMMUNITY): Payer: Self-pay | Admitting: Obstetrics

## 2022-07-01 ENCOUNTER — Other Ambulatory Visit (HOSPITAL_COMMUNITY): Payer: Self-pay | Admitting: Internal Medicine

## 2022-07-01 DIAGNOSIS — Z1231 Encounter for screening mammogram for malignant neoplasm of breast: Secondary | ICD-10-CM

## 2022-07-18 ENCOUNTER — Ambulatory Visit (HOSPITAL_COMMUNITY)
Admission: RE | Admit: 2022-07-18 | Discharge: 2022-07-18 | Disposition: A | Discharge status: Home / Self Care | Payer: Medicare Other | Source: Ambulatory Visit | Attending: Internal Medicine | Admitting: Internal Medicine

## 2022-07-18 DIAGNOSIS — Z1231 Encounter for screening mammogram for malignant neoplasm of breast: Secondary | ICD-10-CM | POA: Diagnosis not present

## 2022-10-08 DIAGNOSIS — E782 Mixed hyperlipidemia: Secondary | ICD-10-CM | POA: Diagnosis not present

## 2022-10-08 DIAGNOSIS — R7301 Impaired fasting glucose: Secondary | ICD-10-CM | POA: Diagnosis not present

## 2022-10-15 DIAGNOSIS — M545 Low back pain, unspecified: Secondary | ICD-10-CM | POA: Diagnosis not present

## 2022-10-15 DIAGNOSIS — N183 Chronic kidney disease, stage 3 unspecified: Secondary | ICD-10-CM | POA: Diagnosis not present

## 2022-10-15 DIAGNOSIS — E782 Mixed hyperlipidemia: Secondary | ICD-10-CM | POA: Diagnosis not present

## 2022-10-15 DIAGNOSIS — R809 Proteinuria, unspecified: Secondary | ICD-10-CM | POA: Diagnosis not present

## 2022-10-15 DIAGNOSIS — R7301 Impaired fasting glucose: Secondary | ICD-10-CM | POA: Diagnosis not present

## 2022-10-15 DIAGNOSIS — Z1211 Encounter for screening for malignant neoplasm of colon: Secondary | ICD-10-CM | POA: Diagnosis not present

## 2022-10-15 DIAGNOSIS — G5603 Carpal tunnel syndrome, bilateral upper limbs: Secondary | ICD-10-CM | POA: Diagnosis not present

## 2022-10-15 DIAGNOSIS — M810 Age-related osteoporosis without current pathological fracture: Secondary | ICD-10-CM | POA: Diagnosis not present

## 2022-10-15 DIAGNOSIS — Z23 Encounter for immunization: Secondary | ICD-10-CM | POA: Diagnosis not present

## 2023-01-22 DIAGNOSIS — M9902 Segmental and somatic dysfunction of thoracic region: Secondary | ICD-10-CM | POA: Diagnosis not present

## 2023-01-22 DIAGNOSIS — M9905 Segmental and somatic dysfunction of pelvic region: Secondary | ICD-10-CM | POA: Diagnosis not present

## 2023-01-22 DIAGNOSIS — M9903 Segmental and somatic dysfunction of lumbar region: Secondary | ICD-10-CM | POA: Diagnosis not present

## 2023-01-22 DIAGNOSIS — M62830 Muscle spasm of back: Secondary | ICD-10-CM | POA: Diagnosis not present

## 2023-04-09 DIAGNOSIS — R7301 Impaired fasting glucose: Secondary | ICD-10-CM | POA: Diagnosis not present

## 2023-04-09 DIAGNOSIS — E782 Mixed hyperlipidemia: Secondary | ICD-10-CM | POA: Diagnosis not present

## 2023-04-16 DIAGNOSIS — M9905 Segmental and somatic dysfunction of pelvic region: Secondary | ICD-10-CM | POA: Diagnosis not present

## 2023-04-16 DIAGNOSIS — M9903 Segmental and somatic dysfunction of lumbar region: Secondary | ICD-10-CM | POA: Diagnosis not present

## 2023-04-16 DIAGNOSIS — M9902 Segmental and somatic dysfunction of thoracic region: Secondary | ICD-10-CM | POA: Diagnosis not present

## 2023-04-16 DIAGNOSIS — M62830 Muscle spasm of back: Secondary | ICD-10-CM | POA: Diagnosis not present

## 2023-04-17 DIAGNOSIS — E782 Mixed hyperlipidemia: Secondary | ICD-10-CM | POA: Diagnosis not present

## 2023-04-17 DIAGNOSIS — Z0000 Encounter for general adult medical examination without abnormal findings: Secondary | ICD-10-CM | POA: Diagnosis not present

## 2023-04-17 DIAGNOSIS — N183 Chronic kidney disease, stage 3 unspecified: Secondary | ICD-10-CM | POA: Diagnosis not present

## 2023-04-17 DIAGNOSIS — M810 Age-related osteoporosis without current pathological fracture: Secondary | ICD-10-CM | POA: Diagnosis not present

## 2023-04-17 DIAGNOSIS — R7301 Impaired fasting glucose: Secondary | ICD-10-CM | POA: Diagnosis not present

## 2023-04-17 DIAGNOSIS — R809 Proteinuria, unspecified: Secondary | ICD-10-CM | POA: Diagnosis not present

## 2023-04-17 DIAGNOSIS — G5603 Carpal tunnel syndrome, bilateral upper limbs: Secondary | ICD-10-CM | POA: Diagnosis not present

## 2023-04-17 DIAGNOSIS — Z79899 Other long term (current) drug therapy: Secondary | ICD-10-CM | POA: Diagnosis not present

## 2023-04-17 DIAGNOSIS — M545 Low back pain, unspecified: Secondary | ICD-10-CM | POA: Diagnosis not present

## 2023-07-03 ENCOUNTER — Other Ambulatory Visit (HOSPITAL_COMMUNITY): Payer: Self-pay | Admitting: Internal Medicine

## 2023-07-03 DIAGNOSIS — Z1231 Encounter for screening mammogram for malignant neoplasm of breast: Secondary | ICD-10-CM

## 2023-07-09 DIAGNOSIS — M9905 Segmental and somatic dysfunction of pelvic region: Secondary | ICD-10-CM | POA: Diagnosis not present

## 2023-07-09 DIAGNOSIS — M62830 Muscle spasm of back: Secondary | ICD-10-CM | POA: Diagnosis not present

## 2023-07-09 DIAGNOSIS — M9903 Segmental and somatic dysfunction of lumbar region: Secondary | ICD-10-CM | POA: Diagnosis not present

## 2023-07-09 DIAGNOSIS — M9902 Segmental and somatic dysfunction of thoracic region: Secondary | ICD-10-CM | POA: Diagnosis not present

## 2023-07-23 ENCOUNTER — Ambulatory Visit (HOSPITAL_COMMUNITY)
Admission: RE | Admit: 2023-07-23 | Discharge: 2023-07-23 | Disposition: A | Discharge status: Home / Self Care | Payer: Medicare Other | Source: Ambulatory Visit | Attending: Internal Medicine | Admitting: Internal Medicine

## 2023-07-23 DIAGNOSIS — Z1231 Encounter for screening mammogram for malignant neoplasm of breast: Secondary | ICD-10-CM | POA: Insufficient documentation

## 2023-10-01 DIAGNOSIS — M9905 Segmental and somatic dysfunction of pelvic region: Secondary | ICD-10-CM | POA: Diagnosis not present

## 2023-10-01 DIAGNOSIS — M62830 Muscle spasm of back: Secondary | ICD-10-CM | POA: Diagnosis not present

## 2023-10-01 DIAGNOSIS — M9902 Segmental and somatic dysfunction of thoracic region: Secondary | ICD-10-CM | POA: Diagnosis not present

## 2023-10-01 DIAGNOSIS — M9903 Segmental and somatic dysfunction of lumbar region: Secondary | ICD-10-CM | POA: Diagnosis not present

## 2023-10-20 DIAGNOSIS — R7301 Impaired fasting glucose: Secondary | ICD-10-CM | POA: Diagnosis not present

## 2023-10-20 DIAGNOSIS — E782 Mixed hyperlipidemia: Secondary | ICD-10-CM | POA: Diagnosis not present

## 2023-10-27 DIAGNOSIS — N183 Chronic kidney disease, stage 3 unspecified: Secondary | ICD-10-CM | POA: Diagnosis not present

## 2023-10-27 DIAGNOSIS — R809 Proteinuria, unspecified: Secondary | ICD-10-CM | POA: Diagnosis not present

## 2023-10-27 DIAGNOSIS — G5603 Carpal tunnel syndrome, bilateral upper limbs: Secondary | ICD-10-CM | POA: Diagnosis not present

## 2023-10-27 DIAGNOSIS — M810 Age-related osteoporosis without current pathological fracture: Secondary | ICD-10-CM | POA: Diagnosis not present

## 2023-10-27 DIAGNOSIS — Z23 Encounter for immunization: Secondary | ICD-10-CM | POA: Diagnosis not present

## 2023-10-27 DIAGNOSIS — Z79899 Other long term (current) drug therapy: Secondary | ICD-10-CM | POA: Diagnosis not present

## 2023-10-27 DIAGNOSIS — E870 Hyperosmolality and hypernatremia: Secondary | ICD-10-CM | POA: Diagnosis not present

## 2023-10-27 DIAGNOSIS — M545 Low back pain, unspecified: Secondary | ICD-10-CM | POA: Diagnosis not present

## 2023-10-27 DIAGNOSIS — R7301 Impaired fasting glucose: Secondary | ICD-10-CM | POA: Diagnosis not present

## 2023-10-27 DIAGNOSIS — E782 Mixed hyperlipidemia: Secondary | ICD-10-CM | POA: Diagnosis not present

## 2023-10-28 ENCOUNTER — Telehealth: Payer: Self-pay | Admitting: *Deleted

## 2023-10-28 NOTE — Patient Outreach (Signed)
  Care Coordination   10/28/2023  Name: Kelly Thomas MRN: [id number] DOB: [birth year]   Care Coordination Outreach Attempts:  An unsuccessful telephone outreach was attempted today to offer the patient information about available care coordination services. HIPAA compliant messages left on voicemail providing contact information for CSW, encouraging patient to return CSW's call at her earliest convenience.  Follow Up Plan:  Additional outreach attempts will be made to offer the patient care coordination information and services.   Encounter Outcome:  No Answer.   Care Coordination Interventions:  No, not indicated.    Danford Bad, BSW, MSW, Printmaker Social Work Case Set designer Health  [facility], Population Health Direct Dial: [phone]  Fax: [phone] Email: [email or web address]: [email or web address]

## 2023-11-03 ENCOUNTER — Telehealth: Payer: Self-pay | Admitting: *Deleted

## 2023-11-03 NOTE — Patient Outreach (Signed)
  Care Coordination   11/03/2023  Name: Kelly Thomas MRN: [id number] DOB: 03-26-1956   Care Coordination Outreach Attempts:  A second unsuccessful outreach was attempted today to offer the patient with information about available care coordination services. HIPAA compliant messages left on voicemail providing contact information for CSW, encouraging patient to return CSW's call at her earliest convenience.  Follow Up Plan:  Additional outreach attempts will be made to offer the patient care coordination information and services.   Encounter Outcome:  No Answer.   Care Coordination Interventions:  No, not indicated.    Danford Bad, BSW, MSW, Printmaker Social Work Case Set designer Health  [facility], Population Health Direct Dial: [phone]  Fax: [phone] Email: [email or web address]: [email or web address]

## 2023-11-07 ENCOUNTER — Telehealth: Payer: Self-pay | Admitting: *Deleted

## 2023-11-07 DIAGNOSIS — R1909 Other intra-abdominal and pelvic swelling, mass and lump: Secondary | ICD-10-CM | POA: Diagnosis not present

## 2023-11-07 NOTE — Patient Outreach (Addendum)
  Care Coordination   11/07/2023  Name: Kelly Thomas MRN: [id number] DOB: Jan 25, 1956   Care Coordination Outreach Attempts:  A third unsuccessful outreach was attempted today to offer the patient with information about available complex care management services. HIPAA compliant messages left on voicemail providing contact information for CSW, encouraging patient to return CSW's call at her earliest convenience.  Follow Up Plan:  No further outreach attempts will be made at this time. We have been unable to contact the patient to offer or enroll patient in complex care management services.  Encounter Outcome:  No Answer.   Care Coordination Interventions:  No, not indicated.    Danford Bad, BSW, MSW, Printmaker Social Work Case Set designer Health  [facility], Population Health Direct Dial: [phone]  Fax: [phone] Email: [email or web address] Website: [email or web address]

## 2023-11-18 ENCOUNTER — Encounter: Payer: Self-pay | Admitting: Gynecologic Oncology

## 2023-11-18 ENCOUNTER — Telehealth: Payer: Self-pay | Admitting: *Deleted

## 2023-11-18 DIAGNOSIS — R300 Dysuria: Secondary | ICD-10-CM | POA: Diagnosis not present

## 2023-11-18 NOTE — Telephone Encounter (Signed)
 Spoke with the patient regarding the referral to GYN oncology. Patient scheduled as new patient with Dr Viktoria on 1/3 at at 10:30 am.  Patient given an arrival time of 10 am. Explained to the patient the the doctor will perform a pelvic exam at this visit. Patient given the policy that only one visitor allowed and that visitor must be over 16 yrs are allowed in the Cancer Center. Patient given the address/phone number for the clinic and that the center offers free valet service. Patient aware that masks are option.

## 2023-11-20 DIAGNOSIS — C539 Malignant neoplasm of cervix uteri, unspecified: Secondary | ICD-10-CM | POA: Insufficient documentation

## 2023-11-20 NOTE — Progress Notes (Signed)
 GYNECOLOGIC ONCOLOGY NEW PATIENT CONSULTATION   Patient Name: Kelly Thomas  Patient Age: 68 y.o. Date of Service: 11/21/23 Referring Provider: Kandyce Sor, MD [address] 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  On exam, she has a friable tumor replacing basically the entire face of her cervix.  The cervix itself is enlarged and barrel-shaped.  On exam, there is no vaginal involvement, but there is some mild thickening of the parametria on the right that I am concerned represents local extension into the parametria.  We discussed the pathogenesis of cervical cancer.  I explained the role that HPV causes and the majority of cervical cancer.  The patient endorses a long history of normal Pap smears and her most recent Pap test in 2021 was normal.  HPV does not appear to been performed at that time.    I have asked my office to reach out to pathology to add lymphovascular invasion as well as PD-L1 testing on her recent biopsy.  I discussed that the next step in her workup would be to get imaging to rule out metastatic disease.  PET scan was ordered and scheduled today.  We have also tentatively scheduled her for a pelvic MRI if PET scan is negative for metastatic disease to help evaluate exam findings concerning for local extension.  We spent some time discussing treatment for early stage cervical cancer with either primary chemotherapy plus radiotherapy or radical hysterectomy and pelvic lymphadenectomy with possible need for tailored adjuvant therapy. I have emphasized that the cure rates for these two treatments are similar. The main  difference between modalities relates to toxicity profiles.  Given risk of toxicity of both treatment modalities, we typically recommend treatment with primary chemoradiation if there is concern or definitive evidence that the patient will need adjuvant radiation after surgery (so we do not compound side effects/toxicity).  Given the size of the patient's lesion and my exam findings, I suspect that recommendation for treatment will be for definitive chemoradiation.  We will wait PET scan results.  Depending on what PET scan shows, we may cancel her scheduled MRI.  I have asked Darice, our nurse navigator, to help schedule the patient for 4 consultation with Drs. Kinard and Lonn in anticipation of chemotherapy and radiation.   Risks of primary chemotherapy and radiotherapy for cervical cancer include cystitis, proctitis, chronic rectal bleeding or sigmoid stricture, small bowel obstruction, ureteral stricture, urinary or enteral fistula, loss of sexual function related to vaginal shortening and atrophy and menopause due to ovarian ablation.   Given the size of her cervical tumor, if all imaging is negative for extracervical disease, she may be a candidate for pelvic radiation with modified HDR and posttreatment interval hysterectomy.  Given new cervical cancer diagnosis, the patient would benefit from HIV testing in the near future.  A copy of this note was sent to the patient's referring provider.   70 minutes of total time was spent for this patient encounter, including preparation, face-to-face counseling with the patient and coordination of care, and documentation of the encounter.  Comer Dollar, MD  Division of Gynecologic Oncology  Department of Obstetrics and Gynecology  [facility]  ___________________________________________  Chief Complaint: Chief Complaint  Patient presents with   Cervical Cancer    History of Present Illness:  Kelly Thomas is a  68 y.o. y.o. female who is seen in consultation at the request of Kandyce Sor, MD for an evaluation of cervical cancer.  Patient comes in with her daughter today.  She notes overall doing well.  She reports good energy.  She notes that in October, she had a small amount of runny dark brown discharge.  This discharge continued infrequently and she had some very occasional light spotting in November.  On December 1, she describes having an episode of heavy bleeding with passage of clots that were the size of golf balls.  She denies any other heavy bleeding after a couple of days.  Mostly, she is just been using thin pads or panty liners that she does not have to change frequently.  She denies any bleeding for the last 2 days.  Bleeding typically is exacerbated by pushing to have a bowel movement.  She denies any pelvic pain or cramping.  She reports being a little constipated the last few days.  She had some sharp suprapubic pain on December 28 and 29 as if she had a urinary tract infection.  This resolved without any intervention.  She reports decreased appetite the last few days secondary to anxiety.  Over the last year, she endorses maybe a 15 pound weight loss which she attributes to eating less.  Her last Pap test was in 2021 and normal.  Cellular changes associated with atrophy present.  HPV was not performed.  Treatment History: Oncology History Overview Note  Patient developed discharge in 08/2023 with postmenopausal bleeding starting in 09/2023.   Cervical cancer (HCC)  11/07/2023 Initial Biopsy   Cervical mass biopsy: Poorly differentiated invasive squamous cell carcinoma   11/07/2023 Imaging   Office ultrasound shows a uterus measuring 6.4 x 4 x 2.1 cm with an endometrial lining measuring 0.9 mm with small endometrial fluid.  Normal bilateral adnexa.  Solid, inhomogenous lesion within the cervix measuring 3.8 x 3.2 x 3.8 cm.   11/20/2023 Initial Diagnosis   Cervical cancer (HCC)     PAST MEDICAL HISTORY:  Past Medical History:  Diagnosis Date   Hyperlipidemia      PAST SURGICAL HISTORY:  Past Surgical History:  Procedure Laterality Date   CHOLECYSTECTOMY     COLONOSCOPY  04/02/2012   Procedure: COLONOSCOPY;  Surgeon: Lamar CHRISTELLA Hollingshead, MD;  Location: [facility];  Service: Endoscopy;  Laterality: N/A;  11:30 AM    OB/GYN HISTORY:  OB History  Gravida Para Term Preterm AB Living  2 2      SAB IAB Ectopic Multiple Live Births          # Outcome Date GA Lbr Len/2nd Weight Sex Type Anes PTL Lv  2 Para           1 Para             No LMP recorded. Patient is postmenopausal.  Age at menarche: 12  Age at menopause: 74 Hx of HRT: denies Hx of STDs: denies Last pap: 2021 NIML, HPV not performed History of abnormal pap smears: denies  SCREENING STUDIES:  Last mammogram: 2023  Last colonoscopy: 2013 Last bone mineral density: 2023  MEDICATIONS: Outpatient Encounter Medications as of 11/21/2023  Medication Sig   atorvastatin (LIPITOR) 10 MG tablet Take 10 mg by mouth 3 (three) times a week.  calcium-vitamin D (OSCAL WITH D) 500-200 MG-UNIT per tablet Take 1 tablet by mouth daily.   Multiple Vitamins-Minerals (MULTIVITAMIN WOMEN PO) Take 1 tablet by mouth daily.   [DISCONTINUED] ibuprofen  (ADVIL ,MOTRIN ) 800 MG tablet Take 1 tablet (800 mg total) by mouth 3 (three) times daily. (Patient not taking: Reported on 11/18/2023)   [DISCONTINUED] oxyCODONE -acetaminophen  (PERCOCET) 5-325 MG per tablet Take 1-2 tablets by mouth every 6 (six) hours as needed for moderate pain or severe pain. (Patient not taking: Reported on 11/18/2023)   No facility-administered encounter medications on file as of 11/21/2023.    ALLERGIES:  No Known Allergies   FAMILY HISTORY:  Family History  Problem Relation Age of Onset   Endometrial cancer Mother    Throat cancer Brother    Ovarian cancer Neg Hx    Colon cancer Neg Hx    Pancreatic cancer Neg Hx    Breast cancer Neg Hx     Prostate cancer Neg Hx      SOCIAL HISTORY:  Social Connections: Not on file    REVIEW OF SYSTEMS:  + Constipation, vaginal bleeding, vaginal discharge Denies appetite changes, fevers, chills, fatigue, unexplained weight changes. Denies hearing loss, neck lumps or masses, mouth sores, ringing in ears or voice changes. Denies cough or wheezing.  Denies shortness of breath. Denies chest pain or palpitations. Denies leg swelling. Denies abdominal distention, pain, blood in stools, diarrhea, nausea, vomiting, or early satiety. Denies pain with intercourse, dysuria, frequency, hematuria or incontinence. Denies hot flashes, pelvic pain.   Denies joint pain, back pain or muscle pain/cramps. Denies itching, rash, or wounds. Denies dizziness, headaches, numbness or seizures. Denies swollen lymph nodes or glands, denies easy bruising or bleeding. Denies anxiety, depression, confusion, or decreased concentration.  Physical Exam:  Vital Signs for this encounter:  Blood pressure 131/67, pulse 87, temperature 98.6 F (37 C), temperature source Oral, resp. rate 19, height 5' (1.524 m), weight 122 lb 9.6 oz (55.6 kg), SpO2 100%. Body mass index is 23.94 kg/m. General: Alert, oriented, no acute distress.  HEENT: Normocephalic, atraumatic. Sclera anicteric.  Chest: Clear to auscultation bilaterally. No wheezes, rhonchi, or rales. Cardiovascular: Regular rate and rhythm, no murmurs, rubs, or gallops.  Abdomen: Normoactive bowel sounds. Soft, nondistended, nontender to palpation. No masses or hepatosplenomegaly appreciated. No palpable fluid wave.  Extremities: Grossly normal range of motion. Warm, well perfused. No edema bilaterally.  Skin: No rashes or lesions.  Lymphatics: No cervical, supraclavicular, or inguinal adenopathy.  GU:  Normal external female genitalia. No lesions. No discharge or bleeding.             Bladder/urethra:  No lesions or masses, well supported bladder              Vagina: Mildly atrophic, no vaginal lesions.             Cervix: The cervix has basically completely replaced by a friable and fungating lesion, measuring approximately 4-5 cm.  The cervix is quite prominent, there is no visible extension to the vagina.  On bimanual exam, vaginal mucosa is smooth circumferentially at the apex without involvement.             Uterus: Small, mobile.  On rectovaginal exam, there is some mild thickening of the right parametria.             Adnexa: No masses appreciated.  Rectal: Deferred.  LABORATORY AND RADIOLOGIC DATA:  Outside medical records were reviewed to synthesize the above history, along with the history and  physical obtained during the visit.   Lab Results  Component Value Date   WBC 8.0 11/09/2014   HGB 13.1 11/09/2014   HCT 39.3 11/09/2014   PLT 259 11/09/2014   GLUCOSE 130 (H) 11/09/2014   NA 138 11/09/2014   K 3.9 11/09/2014   CL 106 11/09/2014   CREATININE 0.98 11/09/2014   BUN 13 11/09/2014   CO2 27 11/09/2014

## 2023-11-21 ENCOUNTER — Inpatient Hospital Stay (HOSPITAL_BASED_OUTPATIENT_CLINIC_OR_DEPARTMENT_OTHER): Payer: Medicare Other | Admitting: Gynecologic Oncology

## 2023-11-21 ENCOUNTER — Encounter: Payer: Self-pay | Admitting: Gynecologic Oncology

## 2023-11-21 ENCOUNTER — Encounter: Payer: Self-pay | Admitting: Oncology

## 2023-11-21 VITALS — BP 131/67 | HR 87 | Temp 98.6°F | Resp 19 | Ht 60.0 in | Wt 122.6 lb

## 2023-11-21 DIAGNOSIS — C539 Malignant neoplasm of cervix uteri, unspecified: Secondary | ICD-10-CM

## 2023-11-21 DIAGNOSIS — K5900 Constipation, unspecified: Secondary | ICD-10-CM | POA: Insufficient documentation

## 2023-11-21 DIAGNOSIS — E785 Hyperlipidemia, unspecified: Secondary | ICD-10-CM | POA: Insufficient documentation

## 2023-11-21 DIAGNOSIS — Z510 Encounter for antineoplastic radiation therapy: Secondary | ICD-10-CM | POA: Diagnosis not present

## 2023-11-21 DIAGNOSIS — R300 Dysuria: Secondary | ICD-10-CM | POA: Insufficient documentation

## 2023-11-21 DIAGNOSIS — R630 Anorexia: Secondary | ICD-10-CM | POA: Insufficient documentation

## 2023-11-21 DIAGNOSIS — Z8049 Family history of malignant neoplasm of other genital organs: Secondary | ICD-10-CM | POA: Insufficient documentation

## 2023-11-21 DIAGNOSIS — Z800 Family history of malignant neoplasm of digestive organs: Secondary | ICD-10-CM | POA: Insufficient documentation

## 2023-11-21 DIAGNOSIS — Z923 Personal history of irradiation: Secondary | ICD-10-CM | POA: Insufficient documentation

## 2023-11-21 DIAGNOSIS — R634 Abnormal weight loss: Secondary | ICD-10-CM | POA: Insufficient documentation

## 2023-11-21 DIAGNOSIS — N950 Postmenopausal bleeding: Secondary | ICD-10-CM | POA: Insufficient documentation

## 2023-11-21 DIAGNOSIS — C530 Malignant neoplasm of endocervix: Secondary | ICD-10-CM | POA: Diagnosis present

## 2023-11-21 DIAGNOSIS — Z9049 Acquired absence of other specified parts of digestive tract: Secondary | ICD-10-CM | POA: Insufficient documentation

## 2023-11-21 DIAGNOSIS — Z5111 Encounter for antineoplastic chemotherapy: Secondary | ICD-10-CM | POA: Insufficient documentation

## 2023-11-21 NOTE — Patient Instructions (Signed)
 It was very nice to meet you today.  We discussed your recent biopsy showing squamous cell carcinoma of the cervix, which is the most common type of cervical cancer.  This frequently arises in the setting of an HPV infection (human papilloma virus).  On exam today, your entire cervix is replaced by the cancer/tumor.  I am concerned that there may be some early growth of the cancer into the tissue next to the cervix on the right.  Will start by getting a PET scan, which will help to evaluate for cancer spread to area such as lymph nodes in your pelvis.  Depending on these results, we may ultimately also get a pelvic MRI (we are going to schedule this but may cancel it depending on the PET results).  I will call you after the PET scan to let you know what the imaging is showing and whether we still need to get the MRI.  Because of findings on my exam, I think most likely we are going to be talking about treatment with radiation and chemotherapy.  There is a possibility we may add some immunotherapy, which will be determined based on additional testing on your tumor as well as your upcoming PET scan.  My office is going to make appointments with our radiation oncology doctor as well as our medical oncology Dr. So that we are ready to start treatment once we have all of the imaging results.  Please do not hesitate to reach out if you have any questions.  Our clinic number is [phone].

## 2023-11-21 NOTE — Progress Notes (Signed)
 Met with Kelly Thomas and her daughter and went over the upcoming appointments with Dr. Bertis Ruddy and Dr. Roselind Messier.  Also provided her with the [facility] and encouraged her to call with any questions.

## 2023-11-21 NOTE — Progress Notes (Signed)
 Requested PD-L1 testing on specimen ID: [id number] with Mitzi Davenport and Angie at [facility] via fax.

## 2023-11-24 ENCOUNTER — Encounter: Payer: Self-pay | Admitting: Oncology

## 2023-11-24 NOTE — Progress Notes (Signed)
 Requested LVI on specimen ID [id number] with [facility] via fax.

## 2023-11-27 ENCOUNTER — Telehealth: Payer: Self-pay | Admitting: Oncology

## 2023-11-27 ENCOUNTER — Encounter (HOSPITAL_COMMUNITY): Payer: Medicare Other

## 2023-11-27 NOTE — Telephone Encounter (Signed)
 Laraya called and said her PET scan was rescheduled to 1/13 because the scanner was down at [facility].  She is wondering if it is ok to have the MRI right after the PET on the same day.    Rescheduled the MRI to 1/17 at 1:00 at [facility].  Chennel Olivos of the new appointment for the MRI and to keep all her scheduled appointments with Dr. Lonn and Dr. Shannon.

## 2023-11-28 NOTE — Progress Notes (Signed)
 GYN Location of Tumor / Histology: Cervical  Kelly Thomas presented with symptoms of: Postmenopausal bleeding with golf ball size blood clots.   Biopsies revealed: NA  Past/Anticipated interventions by Gyn/Onc surgery, if any:  Given the size of the patient's lesion and my exam findings, I suspect that recommendation for treatment will be for definitive chemoradiation.  We will wait PET scan results.  Depending on what PET scan shows, we may cancel her scheduled MRI.  I have asked Kelly Thomas, our nurse navigator, to help schedule the patient for 4 consultation with Drs. Kelly Thomas and Kelly Thomas in anticipation of chemotherapy and radiation.      Kelly Hanger, MD  Division of Gynecologic Oncology  Department of Obstetrics and Gynecology  [facility] Weight changes, if any: yes, patient reports weight loss.   Bowel/Bladder complaints, if any: No., Recently completed course of antibiotics for UTI.  Nausea/Vomiting, if any: no  Pain issues, if any:  no  SAFETY ISSUES: Prior radiation? no Pacemaker/ICD? no Possible current pregnancy? no Is the patient on methotrexate? no  Current Complaints / other details:    BP 137/79 (BP Location: Right Arm, Patient Position: Sitting, Cuff Size: Normal)   Pulse 80   Temp (!) 97.5 F (36.4 C)   Resp 18   Ht 5' (1.524 m)   Wt 123 lb (55.8 kg)   SpO2 100%   BMI 24.02 kg/m

## 2023-12-01 ENCOUNTER — Ambulatory Visit (HOSPITAL_COMMUNITY): Payer: Medicare Other

## 2023-12-01 ENCOUNTER — Encounter (HOSPITAL_COMMUNITY)
Admission: RE | Admit: 2023-12-01 | Discharge: 2023-12-01 | Disposition: A | Discharge status: Home / Self Care | Payer: Medicare Other | Source: Ambulatory Visit | Attending: Gynecologic Oncology | Admitting: Gynecologic Oncology

## 2023-12-01 DIAGNOSIS — C539 Malignant neoplasm of cervix uteri, unspecified: Secondary | ICD-10-CM | POA: Diagnosis present

## 2023-12-01 DIAGNOSIS — I517 Cardiomegaly: Secondary | ICD-10-CM | POA: Diagnosis not present

## 2023-12-01 DIAGNOSIS — I700 Atherosclerosis of aorta: Secondary | ICD-10-CM | POA: Diagnosis not present

## 2023-12-01 DIAGNOSIS — I2510 Atherosclerotic heart disease of native coronary artery without angina pectoris: Secondary | ICD-10-CM | POA: Diagnosis not present

## 2023-12-01 LAB — GLUCOSE, CAPILLARY: Glucose-Capillary: 108 mg/dL — ABNORMAL HIGH (ref 70–99)

## 2023-12-01 MED ORDER — FLUDEOXYGLUCOSE F - 18 (FDG) INJECTION
6.8410 | Freq: Once | INTRAVENOUS | Status: AC
  Administered 2023-12-01: 6.841 via INTRAVENOUS

## 2023-12-02 ENCOUNTER — Ambulatory Visit (HOSPITAL_COMMUNITY): Payer: Medicare Other

## 2023-12-02 ENCOUNTER — Encounter: Payer: Self-pay | Admitting: Hematology and Oncology

## 2023-12-02 ENCOUNTER — Inpatient Hospital Stay: Payer: Medicare Other | Admitting: Hematology and Oncology

## 2023-12-02 VITALS — BP 154/82 | HR 84 | Temp 98.6°F | Resp 18 | Ht 60.0 in | Wt 123.0 lb

## 2023-12-02 DIAGNOSIS — R300 Dysuria: Secondary | ICD-10-CM | POA: Diagnosis not present

## 2023-12-02 DIAGNOSIS — Z923 Personal history of irradiation: Secondary | ICD-10-CM | POA: Diagnosis not present

## 2023-12-02 DIAGNOSIS — C530 Malignant neoplasm of endocervix: Secondary | ICD-10-CM | POA: Diagnosis not present

## 2023-12-02 DIAGNOSIS — Z510 Encounter for antineoplastic radiation therapy: Secondary | ICD-10-CM | POA: Diagnosis not present

## 2023-12-02 DIAGNOSIS — Z5111 Encounter for antineoplastic chemotherapy: Secondary | ICD-10-CM | POA: Diagnosis not present

## 2023-12-02 MED ORDER — LIDOCAINE-PRILOCAINE 2.5-2.5 % EX CREA: TOPICAL_CREAM | CUTANEOUS | 3 refills | Status: DC

## 2023-12-02 MED ORDER — ONDANSETRON HCL 8 MG PO TABS: 8.0000 mg | ORAL_TABLET | Freq: Three times a day (TID) | ORAL | 1 refills | Status: DC | PRN

## 2023-12-02 MED ORDER — PROCHLORPERAZINE MALEATE 10 MG PO TABS: 10.0000 mg | ORAL_TABLET | Freq: Four times a day (QID) | ORAL | 1 refills | Status: DC | PRN

## 2023-12-02 NOTE — Assessment & Plan Note (Signed)
 I reviewed PET/CT imaging with the patient We discussed the role of concurrent chemoradiation therapy with cisplatin   We discussed the role of chemotherapy. The intent is of curative intent.  We discussed some of the risks, benefits, side-effects of cisplatin  and its role as chemo sensitizing agent. The plan for weekly cisplatin  for x5-6 doses along with radiation treatment.  Some of the short term side-effects included, though not limited to, including weight loss, life threatening infections, risk of allergic reactions, need for transfusions of blood products, nausea, vomiting, change in bowel habits, loss of hair, admission to hospital for various reasons, and risks of death.   Long term side-effects are also discussed including risks of infertility, permanent damage to nerve function, hearing loss, chronic fatigue, kidney damage with possibility needing hemodialysis, and rare secondary malignancy including bone marrow disorders.  The patient is aware that the response rates discussed earlier is not guaranteed.  After a long discussion, patient made an informed decision to proceed with the prescribed plan of care.   Patient education material was dispensed. I recommend chemo education class and port placement before we start her on treatment

## 2023-12-02 NOTE — Progress Notes (Signed)
 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  FIGO Stage IIB (cT2b, cN0, cM0)   Cancer Staging  Cervical cancer (HCC) Staging form: Cervix Uteri, AJCC Version 9 - Clinical stage from 12/01/2023: FIGO Stage IIB (cT2b, cN0, cM0) - Signed by Almeda Jacobs, MD on 12/01/2023  Patient presents today to discuss cervical cancer.   HISTORY OF PRESENT ILLNESS::Kelly Thomas is a 68 y.o. female who is accompanied by her supportive husband. she is seen as a courtesy of Dr. Wiley Hanger for an opinion concerning radiation therapy as part of management for her recently diagnosed cervical cancer.   The patient was referred to Dr. Wiley Hanger for a cervical biopsy done on 11/07/2023 that revealed poorly differentiated invasive squamous cell carcinoma. Patient met with Dr. Orvil Bland on 11/21/23 and at that time she reported abnormal bleeding and discharge. She was also experiencing infrequent small amount of runny dark brown discharge that occurred throughout October and November. She then had a heavy bleeding episode with passage of clots that were the size of golf balls in December. She also reported experiencing some sharp suprapubic pain, as if she had a urinary tract infection which resolved without any intervention. On exam, she had a friable tumor replacing basically the entire face of her cervix. The cervix itself was enlarged and barrel-shaped. There was no vaginal involvement, but there was some mild thickening of the parametria.    Patient proceeded with a PET scan done on 12/01/23 which showed a hypermetabolic cervical mass measuring 3.8 cm x 4.6 cm at the greatest extent, compatible with  the provided history of cervical cancer. No evidence of metastatic disease was indicated. Cervical biopsy was reviewed at that time. Biopsy revealed a high-grade squamous cell carcinoma of the cervix. She recommended MRI to help evaluate for findings concerning for local extension from physical exam (parametrial involvement ) this is scheduled for 12/05/2023.   She was seen by Dr. Almeda Jacobs on 12/02/23. At that time she denied any symptoms and was feeling well overall. They discussed the role of chemotherapy before starting treatment.    PREVIOUS RADIATION THERAPY: No  PAST MEDICAL HISTORY:  Past Medical History:  Diagnosis Date   Hyperlipidemia     PAST SURGICAL HISTORY: Past Surgical History:  Procedure Laterality Date   CHOLECYSTECTOMY     COLONOSCOPY  04/02/2012   Procedure: COLONOSCOPY;  Surgeon: Suzette Espy, MD;  Location: [facility];  Service: Endoscopy;  Laterality: N/A;  11:30 AM    FAMILY HISTORY:  Family History  Problem Relation Age of Onset   Cancer Mother        Uterine/Endometrial   Endometrial cancer Mother    Cancer Brother        throat cancer   Throat cancer Brother    Ovarian cancer Neg Hx    Colon cancer Neg Hx    Pancreatic cancer Neg Hx    Breast cancer Neg Hx    Prostate cancer Neg Hx     SOCIAL HISTORY:  Social History   Tobacco Use   Smoking status: Never  Vaping Use   Vaping status: Never Used  Substance Use Topics   Alcohol use: No  Drug use: Not Currently    ALLERGIES: No Known Allergies  MEDICATIONS:  Current Outpatient Medications  Medication Sig Dispense Refill   atorvastatin (LIPITOR) 10 MG tablet Take 10 mg by mouth 3 (three) times a week.     calcium-vitamin D (OSCAL WITH D) 500-200 MG-UNIT per tablet Take 1 tablet by mouth daily.     Multiple Vitamins-Minerals (MULTIVITAMIN WOMEN PO) Take 1 tablet by mouth daily.     lidocaine -prilocaine  (EMLA ) cream Apply to affected area once (Patient not taking: Reported on  12/03/2023) 30 g 3   ondansetron  (ZOFRAN ) 8 MG tablet Take 1 tablet (8 mg total) by mouth every 8 (eight) hours as needed for nausea or vomiting. Start on the third day after cisplatin . (Patient not taking: Reported on 12/03/2023) 30 tablet 1   prochlorperazine  (COMPAZINE ) 10 MG tablet Take 1 tablet (10 mg total) by mouth every 6 (six) hours as needed (Nausea or vomiting). (Patient not taking: Reported on 12/03/2023) 30 tablet 1   No current facility-administered medications for this encounter.    REVIEW OF SYSTEMS:  Patient states she is notices scant vaginal bleeding with straining and thick vaginal discharge. She otherwise denies any abdominal pain, new back pain, or bowel/bladder changes.   PHYSICAL EXAM:  height is 5' (1.524 m) and weight is 123 lb (55.8 kg). Her temperature is 97.5 F (36.4 C) (abnormal). Her blood pressure is 137/79 and her pulse is 80. Her respiration is 18 and oxygen saturation is 100%.   General: Alert and oriented, in no acute distress HEENT: Head is normocephalic. Extraocular movements are intact. Oropharynx is clear. Neck: Neck is supple, no palpable cervical or supraclavicular lymphadenopathy. Heart: Regular in rate and rhythm with no murmurs, rubs, or gallops. Chest: Clear to auscultation bilaterally, with no rhonchi, wheezes, or rales. Abdomen: Soft, nontender, nondistended, with no rigidity or guarding. Extremities: No cyanosis or edema. Lymphatics: see Neck Exam Skin: No concerning lesions. Musculoskeletal: symmetric strength and muscle tone throughout. Neurologic: Cranial nerves II through XII are grossly intact. No obvious focalities. Speech is fluent. Coordination is intact. Psychiatric: Judgment and insight are intact. Affect is appropriate.  Pelvic exam deferred today.  Will be performed at the time of simulation.  ECOG = 1  0 - Asymptomatic (Fully active, able to carry on all predisease activities without restriction)  1 - Symptomatic but completely  ambulatory (Restricted in physically strenuous activity but ambulatory and able to carry out work of a light or sedentary nature. For example, light housework, office work)  2 - Symptomatic, <50% in bed during the day (Ambulatory and capable of all self care but unable to carry out any work activities. Up and about more than 50% of waking hours)  3 - Symptomatic, >50% in bed, but not bedbound (Capable of only limited self-care, confined to bed or chair 50% or more of waking hours)  4 - Bedbound (Completely disabled. Cannot carry on any self-care. Totally confined to bed or chair)  5 - Death   Aurea Blossom MM, Creech RH, Tormey DC, et al. [phone]). "Toxicity and response criteria of the [facility]". Am. Hillard Lowes. Oncol. 5 (6): 649-55  LABORATORY DATA:  Lab Results  Component Value Date   WBC 8.0 11/09/2014   HGB 13.1 11/09/2014   HCT 39.3 11/09/2014   MCV 92.0 11/09/2014   PLT 259 11/09/2014   NEUTROABS 6.7 11/09/2014   Lab Results  Component Value Date   NA 138 11/09/2014   K 3.9 11/09/2014  CL 106 11/09/2014   CO2 27 11/09/2014   GLUCOSE 130 (H) 11/09/2014   BUN 13 11/09/2014   CREATININE 0.98 11/09/2014   CALCIUM 9.1 11/09/2014      RADIOGRAPHY: NM PET Image Initial (PI) Skull Base To Thigh (F-18 FDG) Result Date: 12/01/2023 CLINICAL DATA:  Initial treatment strategy for cervical cancer. EXAM: NUCLEAR MEDICINE PET SKULL BASE TO THIGH TECHNIQUE: 6.8 mCi F-18 FDG was injected intravenously. Full-ring PET imaging was performed from the skull base to thigh after the radiotracer. CT data was obtained and used for attenuation correction and anatomic localization. Fasting blood glucose: 120 mg/dl COMPARISON:  None Available. FINDINGS: Mediastinal blood pool activity: SUV max 2.5 Liver activity: SUV max NA NECK: No abnormal hypermetabolism. Incidental CT findings: None. CHEST: No abnormal hypermetabolism. Incidental CT findings: Atherosclerotic calcification of the aorta  and left anterior descending coronary artery. Heart is at the upper limits of normal in size to mildly enlarged. No pericardial or pleural effusion. ABDOMEN/PELVIS: A mass involving the cervix, and likely lower uterine segment, measures approximately 3.8 x 4.6 cm (4/156), SUV max 25.9. No additional abnormal hypermetabolism. Incidental CT findings: Cholecystectomy. Kidneys appear non rotated, an anatomical variant. Atherosclerotic calcification of the aorta. SKELETON: No abnormal hypermetabolism. Incidental CT findings: Minimal degenerative change in the spine. IMPRESSION: 1. Hypermetabolic cervical mass, compatible with the provided history of cervical cancer. No evidence of metastatic disease. 2. Aortic atherosclerosis (ICD10-I70.0). Coronary artery calcification. Electronically Signed   By: Shearon Denis M.D.   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  FIGO Stage IIB (cT2b, cN0, cM0)   Patient's case was discussed at our tumor board and the consensus was to proceed with concurrent chemoradiation treatment. If MRI does not shows local extension, the recommendation was to consider 1 fraction of  brachytherapy followed by surgery depending on the patient's response to her initial course of treatment..  If MRI shows parametrial extension then the patient will proceed with a definitive course of external beam and radiosensitizing chemotherapy followed by 5 brachytherapy procedures.  Today, I talked to the patient and and her husband about the findings and work-up thus far.  We discussed the natural history of cervical cancer and general treatment, highlighting the role of radiotherapy in the management.  We discussed the available radiation techniques, and focused on the details of logistics and delivery.  We reviewed the anticipated acute and late sequelae associated with radiation in this setting.  The patient was encouraged to ask  questions that I answered to the best of my ability. A patient consent form was discussed and signed.  We retained a copy for our records.  The patient would like to proceed with radiation and will be scheduled for CT simulation.  PLAN: Patient is scheduled for CT simulation on 12/09/2023. Anticipate 30 fractions of pelvic EBRT administered concurrently with chemotherapy. Her first chemotherapy treatment is scheduled for 12/19/2023. We will wait for the final MRI results to determine final treatment recommendations regarding brachytherapy.     60 minutes of total time was spent for this patient encounter, including preparation, face-to-face counseling with the patient and coordination of care, physical exam, and documentation of the encounter.   ------------------------------------------------   Julio Ohm, PA-C   Noralee Beam, PhD, MD   [facility]  Radiation Oncology Direct Dial: [phone]  Fax: [phone] [email or web address]   This document serves as a record of services personally performed by Retta Caster, MD  and Julio Ohm, PA-C. It was created on his behalf by Lucky Sable, a trained medical scribe. The creation of this record is based on the scribe's personal observations and the provider's statements to them. This document has been checked and approved by the attending provider.

## 2023-12-02 NOTE — Progress Notes (Signed)
START OFF PATHWAY REGIMEN - Other   [id number]:Cisplatin 40 mg/m2 IV D1 q7 Days + RT:   A cycle is every 7 days:     Cisplatin   **Always confirm dose/schedule in your pharmacy ordering system**  Patient Characteristics: Intent of Therapy: Curative Intent, Discussed with Patient 

## 2023-12-02 NOTE — Progress Notes (Signed)
  Cancer Center CONSULT NOTE  Patient Care Team: Shona Norleen PEDLAR, MD as PCP - General (Internal Medicine)  ASSESSMENT & PLAN:  Cervical cancer [facility]) I reviewed PET/CT imaging with the patient We discussed the role of concurrent chemoradiation therapy with cisplatin   We discussed the role of chemotherapy. The intent is of curative intent.  We discussed some of the risks, benefits, side-effects of cisplatin  and its role as chemo sensitizing agent. The plan for weekly cisplatin  for x5-6 doses along with radiation treatment.  Some of the short term side-effects included, though not limited to, including weight loss, life threatening infections, risk of allergic reactions, need for transfusions of blood products, nausea, vomiting, change in bowel habits, loss of hair, admission to hospital for various reasons, and risks of death.   Long term side-effects are also discussed including risks of infertility, permanent damage to nerve function, hearing loss, chronic fatigue, kidney damage with possibility needing hemodialysis, and rare secondary malignancy including bone marrow disorders.  The patient is aware that the response rates discussed earlier is not guaranteed.  After a long discussion, patient made an informed decision to proceed with the prescribed plan of care.   Patient education material was dispensed. I recommend chemo education class and port placement before we start her on treatment   Orders Placed This Encounter  Procedures   IR IMAGING GUIDED PORT INSERTION    Standing Status:   Future    Expected Date:   12/09/2023    Expiration Date:   12/01/2024    Reason for Exam (SYMPTOM  OR DIAGNOSIS REQUIRED):   need port for chemo to start 1/29    Preferred Imaging Location?:   [facility]   CBC with Differential (Cancer Center Only)    Standing Status:   Future    Expected Date:   12/19/2023    Expiration Date:   12/18/2024   Basic Metabolic Panel - Cancer Center  Only    Standing Status:   Future    Expected Date:   12/19/2023    Expiration Date:   12/18/2024   Magnesium     Standing Status:   Future    Expected Date:   12/19/2023    Expiration Date:   12/18/2024   CBC with Differential (Cancer Center Only)    Standing Status:   Future    Expected Date:   12/26/2023    Expiration Date:   12/25/2024   Basic Metabolic Panel - Cancer Center Only    Standing Status:   Future    Expected Date:   12/26/2023    Expiration Date:   12/25/2024   Magnesium     Standing Status:   Future    Expected Date:   12/26/2023    Expiration Date:   12/25/2024   CBC with Differential (Cancer Center Only)    Standing Status:   Future    Expected Date:   01/02/2024    Expiration Date:   01/01/2025   Basic Metabolic Panel - Cancer Center Only    Standing Status:   Future    Expected Date:   01/02/2024    Expiration Date:   01/01/2025   Magnesium     Standing Status:   Future    Expected Date:   01/02/2024    Expiration Date:   01/01/2025   CBC with Differential (Cancer Center Only)    Standing Status:   Future    Expected Date:   01/09/2024    Expiration Date:  01/08/2025   Basic Metabolic Panel - Cancer Center Only    Standing Status:   Future    Expected Date:   01/09/2024    Expiration Date:   01/08/2025   Magnesium     Standing Status:   Future    Expected Date:   01/09/2024    Expiration Date:   01/08/2025   CBC with Differential (Cancer Center Only)    Standing Status:   Future    Expected Date:   01/16/2024    Expiration Date:   01/15/2025   Basic Metabolic Panel - Cancer Center Only    Standing Status:   Future    Expected Date:   01/16/2024    Expiration Date:   01/15/2025   Magnesium     Standing Status:   Future    Expected Date:   01/16/2024    Expiration Date:   01/15/2025   CBC with Differential (Cancer Center Only)    Standing Status:   Future    Expected Date:   01/23/2024    Expiration Date:   01/22/2025   Basic Metabolic Panel - Cancer Center Only    Standing  Status:   Future    Expected Date:   01/23/2024    Expiration Date:   01/22/2025   Magnesium     Standing Status:   Future    Expected Date:   01/23/2024    Expiration Date:   01/22/2025    The total time spent in the appointment was 60 minutes encounter with patients including review of chart and various tests results, discussions about plan of care and coordination of care plan   All questions were answered. The patient knows to call the clinic with any problems, questions or concerns. No barriers to learning was detected.  Almarie Bedford, MD 1/14/20251:52 PM  CHIEF COMPLAINTS/PURPOSE OF CONSULTATION:  Cervical cancer, for definitive chemoradiation therapy  HISTORY OF PRESENTING ILLNESS:  Kelly Thomas 68 y.o. female is here because of recent diagnosis of cervical cancer She is here accompanied by her husband, Alm The patient is retired She has 2 children She is very healthy with no major comorbidities She was diagnosed with cervical cancer after presentation with postmenopausal bleeding and discharge Biopsy from last month confirmed diagnosis of invasive squamous carcinoma She underwent imaging study She denies pelvic pain No recent changes in appetite or weight loss  I have reviewed her chart and materials related to her cancer extensively and collaborated history with the patient. Summary of oncologic history is as follows: Oncology History  Cervical cancer (HCC)  09/19/2023 Initial Diagnosis   Patient developed discharge in 08/2023 with postmenopausal bleeding starting in 09/2023.    11/07/2023 Initial Biopsy   Report available at [facility] Cervical mass biopsy: Poorly differentiated invasive squamous cell carcinoma   11/07/2023 Imaging   Office ultrasound shows a uterus measuring 6.4 x 4 x 2.1 cm with an endometrial lining measuring 0.9 mm with small endometrial fluid.  Normal bilateral adnexa.  Solid, inhomogenous lesion within the cervix measuring 3.8 x 3.2 x 3.8 cm.    11/20/2023 Initial Diagnosis   Cervical cancer (HCC)   12/01/2023 Cancer Staging   Staging form: Cervix Uteri, AJCC Version 9 - Clinical stage from 12/01/2023: FIGO Stage IIB (cT2b, cN0, cM0) - Signed by Bedford Almarie, MD on 12/01/2023 Stage prefix: Initial diagnosis   12/01/2023 PET scan   NM PET Image Initial (PI) Skull Base To Thigh (F-18 FDG) Result Date: 12/01/2023 CLINICAL DATA:  Initial treatment strategy for  cervical cancer. EXAM: NUCLEAR MEDICINE PET SKULL BASE TO THIGH TECHNIQUE: 6.8 mCi F-18 FDG was injected intravenously. Full-ring PET imaging was performed from the skull base to thigh after the radiotracer. CT data was obtained and used for attenuation correction and anatomic localization. Fasting blood glucose: 120 mg/dl COMPARISON:  None Available. FINDINGS: Mediastinal blood pool activity: SUV max 2.5 Liver activity: SUV max NA NECK: No abnormal hypermetabolism. Incidental CT findings: None. CHEST: No abnormal hypermetabolism. Incidental CT findings: Atherosclerotic calcification of the aorta and left anterior descending coronary artery. Heart is at the upper limits of normal in size to mildly enlarged. No pericardial or pleural effusion. ABDOMEN/PELVIS: A mass involving the cervix, and likely lower uterine segment, measures approximately 3.8 x 4.6 cm (4/156), SUV max 25.9. No additional abnormal hypermetabolism. Incidental CT findings: Cholecystectomy. Kidneys appear non rotated, an anatomical variant. Atherosclerotic calcification of the aorta. SKELETON: No abnormal hypermetabolism. Incidental CT findings: Minimal degenerative change in the spine. IMPRESSION: 1. Hypermetabolic cervical mass, compatible with the provided history of cervical cancer. No evidence of metastatic disease. 2. Aortic atherosclerosis (ICD10-I70.0). Coronary artery calcification. Electronically Signed   By: Newell Eke M.D.   On: 12/01/2023 16:15      12/19/2023 -  Chemotherapy   Patient is on Treatment Plan :  cervical cancer Cisplatin  (40) q7d       MEDICAL HISTORY:  Past Medical History:  Diagnosis Date   Hyperlipidemia     SURGICAL HISTORY: Past Surgical History:  Procedure Laterality Date   CHOLECYSTECTOMY     COLONOSCOPY  04/02/2012   Procedure: COLONOSCOPY;  Surgeon: Lamar CHRISTELLA Hollingshead, MD;  Location: [facility];  Service: Endoscopy;  Laterality: N/A;  11:30 AM    SOCIAL HISTORY: Social History   Socioeconomic History   Marital status: Married    Spouse name: Not on file   Number of children: Not on file   Years of education: Not on file   Highest education level: Not on file  Occupational History   Not on file  Tobacco Use   Smoking status: Never   Smokeless tobacco: Not on file  Vaping Use   Vaping status: Never Used  Substance and Sexual Activity   Alcohol use: No   Drug use: Not Currently   Sexual activity: Yes  Other Topics Concern   Not on file  Social History Narrative   Not on file   Social Drivers of Health   Financial Resource Strain: Not on file  Food Insecurity: No Food Insecurity (11/18/2023)   Hunger Vital Sign    Worried About Running Out of Food in the Last Year: Never true    Ran Out of Food in the Last Year: Never true  Transportation Needs: No Transportation Needs (11/18/2023)   PRAPARE - Administrator, Civil Service (Medical): No    Lack of Transportation (Non-Medical): No  Physical Activity: Not on file  Stress: Not on file  Social Connections: Not on file  Intimate Partner Violence: Not on file    FAMILY HISTORY: Family History  Problem Relation Age of Onset   Endometrial cancer Mother    Throat cancer Brother    Ovarian cancer Neg Hx    Colon cancer Neg Hx    Pancreatic cancer Neg Hx    Breast cancer Neg Hx    Prostate cancer Neg Hx     ALLERGIES:  has no known allergies.  MEDICATIONS:  Current Outpatient Medications  Medication Sig Dispense Refill   atorvastatin (LIPITOR)  10 MG tablet Take 10 mg by mouth 3  (three) times a week.     calcium-vitamin D (OSCAL WITH D) 500-200 MG-UNIT per tablet Take 1 tablet by mouth daily.     lidocaine -prilocaine  (EMLA ) cream Apply to affected area once 30 g 3   Multiple Vitamins-Minerals (MULTIVITAMIN WOMEN PO) Take 1 tablet by mouth daily.     ondansetron  (ZOFRAN ) 8 MG tablet Take 1 tablet (8 mg total) by mouth every 8 (eight) hours as needed for nausea or vomiting. Start on the third day after cisplatin . 30 tablet 1   prochlorperazine  (COMPAZINE ) 10 MG tablet Take 1 tablet (10 mg total) by mouth every 6 (six) hours as needed (Nausea or vomiting). 30 tablet 1   No current facility-administered medications for this visit.    REVIEW OF SYSTEMS:   Constitutional: Denies fevers, chills or abnormal night sweats Eyes: Denies blurriness of vision, double vision or watery eyes Ears, nose, mouth, throat, and face: Denies mucositis or sore throat Respiratory: Denies cough, dyspnea or wheezes Cardiovascular: Denies palpitation, chest discomfort or lower extremity swelling Gastrointestinal:  Denies nausea, heartburn or change in bowel habits Skin: Denies abnormal skin rashes Lymphatics: Denies new lymphadenopathy or easy bruising Neurological:Denies numbness, tingling or new weaknesses Behavioral/Psych: Mood is stable, no new changes  All other systems were reviewed with the patient and are negative.  PHYSICAL EXAMINATION: ECOG PERFORMANCE STATUS: 0 - Asymptomatic  Vitals:   12/02/23 1325  BP: (!) 154/82  Pulse: 84  Resp: 18  Temp: 98.6 F (37 C)  SpO2: 100%   Filed Weights   12/02/23 1325  Weight: 123 lb (55.8 kg)    GENERAL:alert, no distress and comfortable SKIN: skin color, texture, turgor are normal, no rashes or significant lesions EYES: normal, conjunctiva are pink and non-injected, sclera clear OROPHARYNX:no exudate, no erythema and lips, buccal mucosa, and tongue normal  NECK: supple, thyroid normal size, non-tender, without nodularity LYMPH:   no palpable lymphadenopathy in the cervical, axillary or inguinal LUNGS: clear to auscultation and percussion with normal breathing effort HEART: regular rate & rhythm and no murmurs and no lower extremity edema ABDOMEN:abdomen soft, non-tender and normal bowel sounds Musculoskeletal:no cyanosis of digits and no clubbing  PSYCH: alert & oriented x 3 with fluent speech NEURO: no focal motor/sensory deficits  LABORATORY DATA:  I have reviewed the data as listed Lab Results  Component Value Date   WBC 8.0 11/09/2014   HGB 13.1 11/09/2014   HCT 39.3 11/09/2014   MCV 92.0 11/09/2014   PLT 259 11/09/2014   No results for input(s): NA, K, CL, CO2, GLUCOSE, BUN, CREATININE, CALCIUM, GFRNONAA, GFRAA, PROT, ALBUMIN, AST, ALT, ALKPHOS, BILITOT, BILIDIR, IBILI in the last 8760 hours.  RADIOGRAPHIC STUDIES: I have personally reviewed the radiological images as listed and agreed with the findings in the report. NM PET Image Initial (PI) Skull Base To Thigh (F-18 FDG) Result Date: 12/01/2023 CLINICAL DATA:  Initial treatment strategy for cervical cancer. EXAM: NUCLEAR MEDICINE PET SKULL BASE TO THIGH TECHNIQUE: 6.8 mCi F-18 FDG was injected intravenously. Full-ring PET imaging was performed from the skull base to thigh after the radiotracer. CT data was obtained and used for attenuation correction and anatomic localization. Fasting blood glucose: 120 mg/dl COMPARISON:  None Available. FINDINGS: Mediastinal blood pool activity: SUV max 2.5 Liver activity: SUV max NA NECK: No abnormal hypermetabolism. Incidental CT findings: None. CHEST: No abnormal hypermetabolism. Incidental CT findings: Atherosclerotic calcification of the aorta and left anterior descending coronary artery. Heart is  at the upper limits of normal in size to mildly enlarged. No pericardial or pleural effusion. ABDOMEN/PELVIS: A mass involving the cervix, and likely lower uterine segment, measures  approximately 3.8 x 4.6 cm (4/156), SUV max 25.9. No additional abnormal hypermetabolism. Incidental CT findings: Cholecystectomy. Kidneys appear non rotated, an anatomical variant. Atherosclerotic calcification of the aorta. SKELETON: No abnormal hypermetabolism. Incidental CT findings: Minimal degenerative change in the spine. IMPRESSION: 1. Hypermetabolic cervical mass, compatible with the provided history of cervical cancer. No evidence of metastatic disease. 2. Aortic atherosclerosis (ICD10-I70.0). Coronary artery calcification. Electronically Signed   By: Newell Eke M.D.   On: 12/01/2023 16:15

## 2023-12-03 ENCOUNTER — Encounter: Payer: Self-pay | Admitting: Radiation Oncology

## 2023-12-03 ENCOUNTER — Ambulatory Visit
Admission: RE | Admit: 2023-12-03 | Discharge: 2023-12-03 | Disposition: A | Discharge status: Home / Self Care | Payer: Medicare Other | Source: Ambulatory Visit | Attending: Radiation Oncology | Admitting: Radiation Oncology

## 2023-12-03 ENCOUNTER — Other Ambulatory Visit: Payer: Self-pay

## 2023-12-03 VITALS — BP 137/79 | HR 80 | Temp 97.5°F | Resp 18 | Ht 60.0 in | Wt 123.0 lb

## 2023-12-03 DIAGNOSIS — C530 Malignant neoplasm of endocervix: Secondary | ICD-10-CM

## 2023-12-04 ENCOUNTER — Encounter: Payer: Self-pay | Admitting: Obstetrics

## 2023-12-04 ENCOUNTER — Institutional Professional Consult (permissible substitution): Payer: Medicare Other | Admitting: Radiation Oncology

## 2023-12-05 ENCOUNTER — Ambulatory Visit (HOSPITAL_COMMUNITY)
Admission: RE | Admit: 2023-12-05 | Discharge: 2023-12-05 | Disposition: A | Discharge status: Home / Self Care | Payer: Medicare Other | Source: Ambulatory Visit | Attending: Gynecologic Oncology

## 2023-12-05 ENCOUNTER — Telehealth: Payer: Self-pay | Admitting: Gynecologic Oncology

## 2023-12-05 DIAGNOSIS — C539 Malignant neoplasm of cervix uteri, unspecified: Secondary | ICD-10-CM | POA: Insufficient documentation

## 2023-12-05 MED ORDER — GADOBUTROL 1 MMOL/ML IV SOLN
5.5000 mL | Freq: Once | INTRAVENOUS | Status: AC | PRN
  Administered 2023-12-05: 5.5 mL via INTRAVENOUS

## 2023-12-05 NOTE — Progress Notes (Signed)
Unfortunately, MRI shows pretty convincing parametrial involvement. There was not palpable extension to vagina or rectum on my exam, but I think this means we proceed with definitive chemoRT.

## 2023-12-05 NOTE — Telephone Encounter (Signed)
Doing well. Discussed PET and MRI findings. Given locally advanced disease, discussed recommendation to proceed with planned chemoRT.  Eugene Garnet MD Gynecologic Oncology

## 2023-12-07 ENCOUNTER — Other Ambulatory Visit: Payer: Self-pay

## 2023-12-09 ENCOUNTER — Ambulatory Visit
Admission: RE | Admit: 2023-12-09 | Discharge: 2023-12-09 | Disposition: A | Discharge status: Home / Self Care | Payer: Medicare Other | Source: Ambulatory Visit | Attending: Radiation Oncology | Admitting: Radiation Oncology

## 2023-12-09 ENCOUNTER — Other Ambulatory Visit: Payer: Self-pay

## 2023-12-09 DIAGNOSIS — R300 Dysuria: Secondary | ICD-10-CM | POA: Diagnosis not present

## 2023-12-09 DIAGNOSIS — C530 Malignant neoplasm of endocervix: Secondary | ICD-10-CM

## 2023-12-09 DIAGNOSIS — Z5111 Encounter for antineoplastic chemotherapy: Secondary | ICD-10-CM | POA: Diagnosis not present

## 2023-12-09 DIAGNOSIS — Z923 Personal history of irradiation: Secondary | ICD-10-CM | POA: Diagnosis not present

## 2023-12-09 DIAGNOSIS — Z510 Encounter for antineoplastic radiation therapy: Secondary | ICD-10-CM | POA: Diagnosis not present

## 2023-12-09 LAB — CBC WITH DIFFERENTIAL (CANCER CENTER ONLY)
Abs Immature Granulocytes: 0.02 10*3/uL (ref 0.00–0.07)
Basophils Absolute: 0.1 10*3/uL (ref 0.0–0.1)
Basophils Relative: 1 %
Eosinophils Absolute: 0.1 10*3/uL (ref 0.0–0.5)
Eosinophils Relative: 1 %
HCT: 43.9 % (ref 36.0–46.0)
Hemoglobin: 14.6 g/dL (ref 12.0–15.0)
Immature Granulocytes: 0 %
Lymphocytes Relative: 23 %
Lymphs Abs: 1.6 10*3/uL (ref 0.7–4.0)
MCH: 31.3 pg (ref 26.0–34.0)
MCHC: 33.3 g/dL (ref 30.0–36.0)
MCV: 94.2 fL (ref 80.0–100.0)
Monocytes Absolute: 0.6 10*3/uL (ref 0.1–1.0)
Monocytes Relative: 8 %
Neutro Abs: 4.8 10*3/uL (ref 1.7–7.7)
Neutrophils Relative %: 67 %
Platelet Count: 282 10*3/uL (ref 150–400)
RBC: 4.66 MIL/uL (ref 3.87–5.11)
RDW: 13 % (ref 11.5–15.5)
WBC Count: 7.2 10*3/uL (ref 4.0–10.5)
nRBC: 0 % (ref 0.0–0.2)

## 2023-12-09 MED ORDER — OXYCODONE-ACETAMINOPHEN 5-325 MG PO TABS: 1.0000 | ORAL_TABLET | Freq: Four times a day (QID) | ORAL | 0 refills | Status: DC | PRN

## 2023-12-09 MED ORDER — PHENAZOPYRIDINE HCL 200 MG PO TABS: 200.0000 mg | ORAL_TABLET | Freq: Three times a day (TID) | ORAL | 1 refills | Status: DC | PRN

## 2023-12-09 NOTE — Progress Notes (Signed)
Radiation Oncology         ([phone]) [phone] ________________________________  Name: CAITLAIN LOHR MRN: [id number]  Date: 12/09/2023  DOB: January 22, 1956  Follow-Up Visit Note  CC: Benita Stabile, MD  Benita Stabile, MD    ICD-10-CM   1. 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  Since her initial consultation note the patient did undergo an MRI of the pelvis which did document parametrial extension.  The cervical mass also measured approximately 5 cm in greatest dimension.                           ALLERGIES:  has no known allergies.  Meds: Current Outpatient Medications  Medication Sig Dispense Refill   oxyCODONE-acetaminophen (PERCOCET/ROXICET) 5-325 MG tablet Take 1 tablet by mouth every 6 (six) hours as needed for severe pain (pain score 7-10). 20 tablet 0   phenazopyridine (PYRIDIUM) 200 MG tablet Take 1 tablet (200 mg total) by mouth 3 (three) times daily as needed for pain. 20 tablet 1   atorvastatin (LIPITOR) 10 MG tablet Take 10 mg by mouth 3 (three) times a week.     calcium-vitamin D (OSCAL WITH D) 500-200 MG-UNIT per tablet Take 1 tablet by mouth daily.     lidocaine-prilocaine (EMLA) cream Apply to affected area once (Patient not taking: Reported on 12/03/2023) 30 g 3   Multiple Vitamins-Minerals (MULTIVITAMIN WOMEN PO) Take 1 tablet by mouth daily.     ondansetron (ZOFRAN) 8 MG tablet Take 1 tablet (8 mg total) by mouth every 8 (eight) hours as needed for nausea or vomiting. Start on the third day after cisplatin. (Patient not taking: Reported on 12/03/2023) 30 tablet 1   prochlorperazine (COMPAZINE) 10 MG tablet Take 1 tablet (10 mg total) by mouth every 6 (six) hours as needed (Nausea or vomiting). (Patient not taking: Reported on 12/03/2023) 30 tablet 1   No current facility-administered  medications for this encounter.    Physical Findings: The patient is in no acute distress.  Accompanied by her husband on evaluation today.  GU:      Normal external female genitalia. No lesions. No discharge or bleeding.             Bladder/urethra:  No lesions or masses, well supported bladder             Vagina: Mildly atrophic, no vaginal lesions.             Cervix: The cervix is basically completely replaced by a friable and fungating lesion, measuring approximately 4-5 cm.  This mass protrudes into the upper vaginal vault.  The cervical mass bleeds easily on examination.  The cervix is quite prominent, there is no visible extension to the vagina.  On bimanual exam, vaginal mucosa is smooth circumferentially at the apex without involvement.  On rectovaginal exam, there is some mild thickening of the right parametria.  The left parametrial area is clear.                           Lab Findings: Lab Results  Component Value Date   WBC 7.2 12/09/2023   HGB 14.6 12/09/2023   HCT 43.9 12/09/2023   MCV 94.2 12/09/2023   PLT 282 12/09/2023  Radiographic Findings: MR Pelvis W Wo Contrast Result Date: 12/05/2023 CLINICAL DATA:  Cervical cancer staging. EXAM: MRI PELVIS WITHOUT AND WITH CONTRAST TECHNIQUE: Multiplanar multisequence MR imaging of the pelvis was performed both before and after administration of intravenous contrast. CONTRAST:  5.30mL GADAVIST GADOBUTROL 1 MMOL/ML IV SOLN COMPARISON:  PET-CT 12/01/2023 FINDINGS: Urinary Tract:  The urinary bladder appears normal. Bowel: No dilated loops of large or small bowel. No bowel wall thickening or inflammation. Vascular/Lymphatic: No pathologically enlarged lymph nodes. No significant vascular abnormality seen. Reproductive: Left fundal submucosal fibroid shows diffuse enhancement measuring 2.2 x 2.2 cm, image 38/21. Right lateral cervical mass is identified measuring 4.9 x 4.0 by 4.0 cm, image 37/15 and image 16/19. Positive for parametrial  invasion. Tumor extension into the upper 2/3 of the vagina is visualized, image 18/19. There is tumor extension up to the rectum with loss of fat plane between the anterior wall of the rectum and the mass, image 18/19.Anterior extension of the tumor extends up to the posterior wall of the bladder with focal area of loss of fat plane seen on image 45 of series 21. Cannot exclude bladder involvement. No signs of hydroureter. Other:  No ascites identified.  No peritoneal nodules. Musculoskeletal: No suspicious bone lesions identified. IMPRESSION: 1. Right lateral cervical mass is identified measuring 4.9 x 4.0 x 4.0 cm. Positive for parametrial invasion. Tumor extension into the upper 2/3 of the vagina is visualized. There is tumor extension up to the rectum with loss of fat plane between the anterior wall of the rectum and the mass. Anterior extension of the tumor extends up to the posterior wall of the bladder with focal area of loss of fat plane seen. Cannot exclude bladder involvement. 2. No signs of hydroureter. 3. No signs of pelvic lymphadenopathy or peritoneal nodularity. 4. Left fundal submucosal fibroid shows diffuse enhancement measuring 2.2 x 2.2 cm. Electronically Signed   By: Signa Kell M.D.   On: 12/05/2023 14:10   NM PET Image Initial (PI) Skull Base To Thigh (F-18 FDG) Result Date: 12/01/2023 CLINICAL DATA:  Initial treatment strategy for cervical cancer. EXAM: NUCLEAR MEDICINE PET SKULL BASE TO THIGH TECHNIQUE: 6.8 mCi F-18 FDG was injected intravenously. Full-ring PET imaging was performed from the skull base to thigh after the radiotracer. CT data was obtained and used for attenuation correction and anatomic localization. Fasting blood glucose: 120 mg/dl COMPARISON:  None Available. FINDINGS: Mediastinal blood pool activity: SUV max 2.5 Liver activity: SUV max NA NECK: No abnormal hypermetabolism. Incidental CT findings: None. CHEST: No abnormal hypermetabolism. Incidental CT findings:  Atherosclerotic calcification of the aorta and left anterior descending coronary artery. Heart is at the upper limits of normal in size to mildly enlarged. No pericardial or pleural effusion. ABDOMEN/PELVIS: A mass involving the cervix, and likely lower uterine segment, measures approximately 3.8 x 4.6 cm (4/156), SUV max 25.9. No additional abnormal hypermetabolism. Incidental CT findings: Cholecystectomy. Kidneys appear non rotated, an anatomical variant. Atherosclerotic calcification of the aorta. SKELETON: No abnormal hypermetabolism. Incidental CT findings: Minimal degenerative change in the spine. IMPRESSION: 1. Hypermetabolic cervical mass, compatible with the provided history of cervical cancer. No evidence of metastatic disease. 2. Aortic atherosclerosis (ICD10-I70.0). Coronary artery calcification. Electronically Signed   By: Leanna Battles M.D.   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  MRI and clinical exam today confirms parametrial extension.  In light of this the patient would not be a candidate for limited  brachytherapy followed by extrafascial hysterectomy.  Her definitive course of treatment will include radiosensitizing chemotherapy, external beam and 5 brachytherapy procedures.  This was discussed today with the patient and her husband today.  Plan: Patient underwent simulation today and will proceed with treatments next week.  Anticipate 6 weeks of external beam radiation therapy followed by 5 brachytherapy treatments.  Addendum: After pelvic exam today the patient continued to have  bleeding from the cervical mass.  This did not seem to slow and therefore the patient had vaginal packing placed with clotting agent.  Addendum 2: The patient called this afternoon and reported that she continued to have vaginal bleeding despite vaginal packing.  She therefore returned.  A CBC was obtained for baseline purposes.  She then  proceeded to have removal of her initial packing.  Monsel's were placed along the bleeding cervical mass and active bleeding was stopped.  She then had additional vaginal packing placed.  She also had a Foley catheter placed to aid in urination.  Due to the patient's discomfort with these procedures she has been given a limited prescription for Percocet.  She is also been given a prescription for Pyridium.  She return tomorrow to have removal of her vaginal packing. ____________________________________ Antony Blackbird, MD

## 2023-12-09 NOTE — Progress Notes (Deleted)
.  jkfu

## 2023-12-09 NOTE — Progress Notes (Signed)
Radiation Oncology         [phone]) [phone] ________________________________  Name: Kelly Thomas MRN: [id number]  Date: 12/10/2023  DOB: 11-16-1956  Follow-Up Visit Note  CC: Benita Stabile, MD  Carver Fila, MD  No diagnosis found.  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  Narrative:  The patient returns today for removal of her vaginal packing. She was last seen in office on 12-09-23. She experienced continuous bleeding from cervical mass following a pelvic exam. Therefore, vaginal packing and clotting agent was used to stop bleeding. Shortly after, she returned due to continued vaginal bleeding. Monsel's were placed along the cervical mass to stop active bleeding. Additional vaginal packing and a foley catheter were placed to aid in urination. CBC was also obtained to act as baseline.                            CBC    Component Value Date/Time   WBC 7.2 12/09/2023 1600   WBC 8.0 11/09/2014 0719   RBC 4.66 12/09/2023 1600   HGB 14.6 12/09/2023 1600   HCT 43.9 12/09/2023 1600   PLT 282 12/09/2023 1600   MCV 94.2 12/09/2023 1600   MCH 31.3 12/09/2023 1600   MCHC 33.3 12/09/2023 1600   RDW 13.0 12/09/2023 1600   LYMPHSABS 1.6 12/09/2023 1600   MONOABS 0.6 12/09/2023 1600   EOSABS 0.1 12/09/2023 1600   BASOSABS 0.1 12/09/2023 1600    Allergies:  has no known allergies.  Meds: Current Outpatient Medications  Medication Sig Dispense Refill   atorvastatin (LIPITOR) 10 MG tablet Take 10 mg by mouth 3 (three) times a week.     calcium-vitamin D (OSCAL WITH D) 500-200 MG-UNIT per tablet Take 1 tablet by mouth daily.     lidocaine-prilocaine (EMLA) cream Apply to affected area once (Patient not taking: Reported on 12/03/2023) 30 g 3   Multiple Vitamins-Minerals (MULTIVITAMIN WOMEN PO) Take 1 tablet by mouth daily.     ondansetron (ZOFRAN) 8 MG tablet Take 1 tablet (8 mg total) by  mouth every 8 (eight) hours as needed for nausea or vomiting. Start on the third day after cisplatin. (Patient not taking: Reported on 12/03/2023) 30 tablet 1   oxyCODONE-acetaminophen (PERCOCET/ROXICET) 5-325 MG tablet Take 1 tablet by mouth every 6 (six) hours as needed for severe pain (pain score 7-10). 20 tablet 0   phenazopyridine (PYRIDIUM) 200 MG tablet Take 1 tablet (200 mg total) by mouth 3 (three) times daily as needed for pain. 20 tablet 1   prochlorperazine (COMPAZINE) 10 MG tablet Take 1 tablet (10 mg total) by mouth every 6 (six) hours as needed (Nausea or vomiting). (Patient not taking: Reported on 12/03/2023) 30 tablet 1   No current facility-administered medications for this encounter.    Physical Findings: The patient is in no acute distress. Patient is alert and oriented.  vitals were not taken for this visit. .  No significant changes. Lungs are clear to auscultation bilaterally. Heart has regular rate and rhythm. No palpable cervical, supraclavicular, or axillary adenopathy. Abdomen soft, non-tender, normal bowel sounds.   Lab Findings: Lab Results  Component Value Date   WBC 7.2 12/09/2023   HGB 14.6 12/09/2023   HCT 43.9 12/09/2023   MCV 94.2 12/09/2023   PLT 282 12/09/2023    Radiographic Findings: MR Pelvis W Wo Contrast Result Date:  12/05/2023 CLINICAL DATA:  Cervical cancer staging. EXAM: MRI PELVIS WITHOUT AND WITH CONTRAST TECHNIQUE: Multiplanar multisequence MR imaging of the pelvis was performed both before and after administration of intravenous contrast. CONTRAST:  5.38mL GADAVIST GADOBUTROL 1 MMOL/ML IV SOLN COMPARISON:  PET-CT 12/01/2023 FINDINGS: Urinary Tract:  The urinary bladder appears normal. Bowel: No dilated loops of large or small bowel. No bowel wall thickening or inflammation. Vascular/Lymphatic: No pathologically enlarged lymph nodes. No significant vascular abnormality seen. Reproductive: Left fundal submucosal fibroid shows diffuse enhancement  measuring 2.2 x 2.2 cm, image 38/21. Right lateral cervical mass is identified measuring 4.9 x 4.0 by 4.0 cm, image 37/15 and image 16/19. Positive for parametrial invasion. Tumor extension into the upper 2/3 of the vagina is visualized, image 18/19. There is tumor extension up to the rectum with loss of fat plane between the anterior wall of the rectum and the mass, image 18/19.Anterior extension of the tumor extends up to the posterior wall of the bladder with focal area of loss of fat plane seen on image 45 of series 21. Cannot exclude bladder involvement. No signs of hydroureter. Other:  No ascites identified.  No peritoneal nodules. Musculoskeletal: No suspicious bone lesions identified. IMPRESSION: 1. Right lateral cervical mass is identified measuring 4.9 x 4.0 x 4.0 cm. Positive for parametrial invasion. Tumor extension into the upper 2/3 of the vagina is visualized. There is tumor extension up to the rectum with loss of fat plane between the anterior wall of the rectum and the mass. Anterior extension of the tumor extends up to the posterior wall of the bladder with focal area of loss of fat plane seen. Cannot exclude bladder involvement. 2. No signs of hydroureter. 3. No signs of pelvic lymphadenopathy or peritoneal nodularity. 4. Left fundal submucosal fibroid shows diffuse enhancement measuring 2.2 x 2.2 cm. Electronically Signed   By: Signa Kell M.D.   On: 12/05/2023 14:10   NM PET Image Initial (PI) Skull Base To Thigh (F-18 FDG) Result Date: 12/01/2023 CLINICAL DATA:  Initial treatment strategy for cervical cancer. EXAM: NUCLEAR MEDICINE PET SKULL BASE TO THIGH TECHNIQUE: 6.8 mCi F-18 FDG was injected intravenously. Full-ring PET imaging was performed from the skull base to thigh after the radiotracer. CT data was obtained and used for attenuation correction and anatomic localization. Fasting blood glucose: 120 mg/dl COMPARISON:  None Available. FINDINGS: Mediastinal blood pool activity: SUV  max 2.5 Liver activity: SUV max NA NECK: No abnormal hypermetabolism. Incidental CT findings: None. CHEST: No abnormal hypermetabolism. Incidental CT findings: Atherosclerotic calcification of the aorta and left anterior descending coronary artery. Heart is at the upper limits of normal in size to mildly enlarged. No pericardial or pleural effusion. ABDOMEN/PELVIS: A mass involving the cervix, and likely lower uterine segment, measures approximately 3.8 x 4.6 cm (4/156), SUV max 25.9. No additional abnormal hypermetabolism. Incidental CT findings: Cholecystectomy. Kidneys appear non rotated, an anatomical variant. Atherosclerotic calcification of the aorta. SKELETON: No abnormal hypermetabolism. Incidental CT findings: Minimal degenerative change in the spine. IMPRESSION: 1. Hypermetabolic cervical mass, compatible with the provided history of cervical cancer. No evidence of metastatic disease. 2. Aortic atherosclerosis (ICD10-I70.0). Coronary artery calcification. Electronically Signed   By: Leanna Battles M.D.   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  ***  Plan:  ***   *** minutes of total time was spent for this patient encounter, including preparation,  face-to-face counseling with the patient and coordination of care, physical exam, and documentation of the encounter. ____________________________________  Billie Lade, PhD, MD  This document serves as a record of services personally performed by Antony Blackbird, MD. It was created on his behalf by Herbie Saxon, a trained medical scribe. The creation of this record is based on the scribe's personal observations and the provider's statements to them. This document has been checked and approved by the attending provider.

## 2023-12-10 ENCOUNTER — Other Ambulatory Visit: Payer: Self-pay | Admitting: Radiology

## 2023-12-10 ENCOUNTER — Ambulatory Visit
Admission: RE | Admit: 2023-12-10 | Discharge: 2023-12-10 | Disposition: A | Discharge status: Home / Self Care | Payer: Medicare Other | Source: Ambulatory Visit | Attending: Radiation Oncology | Admitting: Radiation Oncology

## 2023-12-10 ENCOUNTER — Other Ambulatory Visit: Payer: Self-pay

## 2023-12-10 VITALS — BP 153/80 | HR 113 | Temp 97.5°F | Resp 20 | Ht 60.0 in

## 2023-12-10 DIAGNOSIS — C530 Malignant neoplasm of endocervix: Secondary | ICD-10-CM

## 2023-12-10 DIAGNOSIS — Z923 Personal history of irradiation: Secondary | ICD-10-CM | POA: Diagnosis not present

## 2023-12-10 DIAGNOSIS — R300 Dysuria: Secondary | ICD-10-CM | POA: Diagnosis not present

## 2023-12-10 DIAGNOSIS — Z5111 Encounter for antineoplastic chemotherapy: Secondary | ICD-10-CM | POA: Diagnosis not present

## 2023-12-10 DIAGNOSIS — Z510 Encounter for antineoplastic radiation therapy: Secondary | ICD-10-CM | POA: Diagnosis not present

## 2023-12-11 ENCOUNTER — Encounter (HOSPITAL_COMMUNITY): Payer: Self-pay

## 2023-12-11 ENCOUNTER — Other Ambulatory Visit: Payer: Self-pay

## 2023-12-11 ENCOUNTER — Ambulatory Visit
Admission: RE | Admit: 2023-12-11 | Discharge: 2023-12-11 | Disposition: A | Discharge status: Home / Self Care | Payer: Medicare Other | Source: Ambulatory Visit | Attending: Radiation Oncology | Admitting: Radiation Oncology

## 2023-12-11 ENCOUNTER — Ambulatory Visit (HOSPITAL_COMMUNITY)
Admission: RE | Admit: 2023-12-11 | Discharge: 2023-12-11 | Disposition: A | Discharge status: Home / Self Care | Payer: Medicare Other | Source: Ambulatory Visit | Attending: Hematology and Oncology | Admitting: Hematology and Oncology

## 2023-12-11 DIAGNOSIS — C530 Malignant neoplasm of endocervix: Secondary | ICD-10-CM

## 2023-12-11 DIAGNOSIS — R300 Dysuria: Secondary | ICD-10-CM | POA: Diagnosis not present

## 2023-12-11 DIAGNOSIS — Z5111 Encounter for antineoplastic chemotherapy: Secondary | ICD-10-CM | POA: Diagnosis not present

## 2023-12-11 DIAGNOSIS — Z923 Personal history of irradiation: Secondary | ICD-10-CM | POA: Diagnosis not present

## 2023-12-11 DIAGNOSIS — Z510 Encounter for antineoplastic radiation therapy: Secondary | ICD-10-CM | POA: Diagnosis not present

## 2023-12-11 DIAGNOSIS — E785 Hyperlipidemia, unspecified: Secondary | ICD-10-CM | POA: Insufficient documentation

## 2023-12-11 HISTORY — PX: IR IMAGING GUIDED PORT INSERTION: IMG5740

## 2023-12-11 LAB — RAD ONC ARIA SESSION SUMMARY
Course Elapsed Days: 0
Plan Fractions Treated to Date: 1
Plan Prescribed Dose Per Fraction: 1.8 Gy
Plan Total Fractions Prescribed: 5
Plan Total Prescribed Dose: 9 Gy
Reference Point Dosage Given to Date: 1.8 Gy
Reference Point Session Dosage Given: 1.8 Gy
Session Number: 1

## 2023-12-11 MED ORDER — HEPARIN SOD (PORK) LOCK FLUSH 100 UNIT/ML IV SOLN
INTRAVENOUS | Status: AC
  Filled 2023-12-11: qty 5

## 2023-12-11 MED ORDER — MIDAZOLAM HCL 2 MG/2ML IJ SOLN
INTRAMUSCULAR | Status: AC
  Filled 2023-12-11: qty 2

## 2023-12-11 MED ORDER — LIDOCAINE-EPINEPHRINE 1 %-1:100000 IJ SOLN
INTRAMUSCULAR | Status: AC
  Filled 2023-12-11: qty 1

## 2023-12-11 MED ORDER — LIDOCAINE-EPINEPHRINE 1 %-1:100000 IJ SOLN
20.0000 mL | Freq: Once | INTRAMUSCULAR | Status: AC
  Administered 2023-12-11: 14 mL via INTRADERMAL

## 2023-12-11 MED ORDER — SODIUM CHLORIDE 0.9 % IV SOLN
INTRAVENOUS | Status: AC | PRN
  Administered 2023-12-11: 999 mL/h via INTRAVENOUS

## 2023-12-11 MED ORDER — MIDAZOLAM HCL 2 MG/2ML IJ SOLN
INTRAMUSCULAR | Status: AC | PRN
  Administered 2023-12-11: 1 mg via INTRAVENOUS

## 2023-12-11 MED ORDER — FENTANYL CITRATE (PF) 100 MCG/2ML IJ SOLN
INTRAMUSCULAR | Status: AC | PRN
  Administered 2023-12-11: 75 ug via INTRAVENOUS

## 2023-12-11 MED ORDER — HEPARIN SOD (PORK) LOCK FLUSH 100 UNIT/ML IV SOLN
500.0000 [IU] | Freq: Once | INTRAVENOUS | Status: AC
Start: 2023-12-11 — End: 2023-12-11
  Administered 2023-12-11: 500 [IU] via INTRAVENOUS

## 2023-12-11 MED ORDER — FENTANYL CITRATE (PF) 100 MCG/2ML IJ SOLN
INTRAMUSCULAR | Status: AC
  Filled 2023-12-11: qty 2

## 2023-12-11 NOTE — Discharge Instructions (Signed)

## 2023-12-11 NOTE — H&P (Signed)
Chief Complaint: Patient was seen in consultation today for cervical cancer.   Referring Physician(s): Artis Delay  Supervising Physician: Malachy Moan  Patient Status: [name] - Out-pt  History of Present Illness: Kelly Thomas is a 68 y.o. female with a past medical history significant for HLD and cervical cancer who presents today for port placement. Ms. Thomas developed post menopausal bleeding in November of 2024 and underwent cervical biopsy in December which showed poor differentiated invasive squamous cell carcinoma and she was subsequently diagnosed with stage IIB cervical cancer. She is planned to undergo radiation and systemic chemotherapy. She presents to IR today for port placement prior to beginning chemotherapy.  Past Medical History:  Diagnosis Date   Hyperlipidemia     Past Surgical History:  Procedure Laterality Date   CHOLECYSTECTOMY     COLONOSCOPY  04/02/2012   Procedure: COLONOSCOPY;  Surgeon: Corbin Ade, MD;  Location: [facility];  Service: Endoscopy;  Laterality: N/A;  11:30 AM    Allergies: Patient has no known allergies.  Medications: Prior to Admission medications   Medication Sig Start Date End Date Taking? Authorizing Provider  atorvastatin (LIPITOR) 10 MG tablet Take 10 mg by mouth 3 (three) times a week. 10/27/23  Yes [provider]  calcium-vitamin D (OSCAL WITH D) 500-200 MG-UNIT per tablet Take 1 tablet by mouth daily.   Yes [provider]  lidocaine-prilocaine (EMLA) cream Apply to affected area once Patient not taking: Reported on 12/03/2023 12/02/23   Artis Delay, MD  Multiple Vitamins-Minerals (MULTIVITAMIN WOMEN PO) Take 1 tablet by mouth daily.   Yes [provider]  ondansetron (ZOFRAN) 8 MG tablet Take 1 tablet (8 mg total) by mouth every 8 (eight) hours as needed for nausea or vomiting. Start on the third day after cisplatin. Patient not taking: Reported on 12/03/2023 12/02/23   Artis Delay, MD   oxyCODONE-acetaminophen (PERCOCET/ROXICET) 5-325 MG tablet Take 1 tablet by mouth every 6 (six) hours as needed for severe pain (pain score 7-10). 12/09/23   Antony Blackbird, MD  phenazopyridine (PYRIDIUM) 200 MG tablet Take 1 tablet (200 mg total) by mouth 3 (three) times daily as needed for pain. 12/09/23   Antony Blackbird, MD  prochlorperazine (COMPAZINE) 10 MG tablet Take 1 tablet (10 mg total) by mouth every 6 (six) hours as needed (Nausea or vomiting). Patient not taking: Reported on 12/03/2023 12/02/23   Artis Delay, MD     Family History  Problem Relation Age of Onset   Cancer Mother        Uterine/Endometrial   Endometrial cancer Mother    Cancer Brother        throat cancer   Throat cancer Brother    Ovarian cancer Neg Hx    Colon cancer Neg Hx    Pancreatic cancer Neg Hx    Breast cancer Neg Hx    Prostate cancer Neg Hx     Social History   Socioeconomic History   Marital status: Married    Spouse name: Not on file   Number of children: Not on file   Years of education: Not on file   Highest education level: Not on file  Occupational History   Not on file  Tobacco Use   Smoking status: Never   Smokeless tobacco: Not on file  Vaping Use   Vaping status: Never Used  Substance and Sexual Activity   Alcohol use: No   Drug use: Not Currently   Sexual activity: Yes  Other Topics Concern  Not on file  Social History Narrative   Not on file   Social Drivers of Health   Financial Resource Strain: Not on file  Food Insecurity: No Food Insecurity (12/03/2023)   Hunger Vital Sign    Worried About Running Out of Food in the Last Year: Never true    Ran Out of Food in the Last Year: Never true  Transportation Needs: No Transportation Needs (12/03/2023)   PRAPARE - Administrator, Civil Service (Medical): No    Lack of Transportation (Non-Medical): No  Physical Activity: Not on file  Stress: Not on file  Social Connections: Not on file     Review of  Systems: A 12 point ROS discussed and pertinent positives are indicated in the HPI above.  All other systems are negative.  Review of Systems  Constitutional:  Negative for chills and fever.  Respiratory:  Negative for cough and shortness of breath.   Cardiovascular:  Negative for chest pain.  Gastrointestinal:  Negative for abdominal pain, diarrhea, nausea and vomiting.  Musculoskeletal:  Negative for back pain.  Neurological:  Negative for dizziness and headaches.    Vital Signs: BP 120/75   Pulse 100   Temp 99.6 F (37.6 C) (Oral)   Resp 20   Ht 5' (1.524 m)   Wt 123 lb (55.8 kg)   SpO2 99%   BMI 24.02 kg/m   Physical Exam Vitals reviewed.  Constitutional:      General: She is not in acute distress. HENT:     Head: Normocephalic.     Mouth/Throat:     Mouth: Mucous membranes are moist.     Pharynx: Oropharynx is clear. No oropharyngeal exudate or posterior oropharyngeal erythema.  Cardiovascular:     Rate and Rhythm: Normal rate and regular rhythm.  Pulmonary:     Effort: Pulmonary effort is normal.     Breath sounds: Normal breath sounds.  Abdominal:     General: There is no distension.     Palpations: Abdomen is soft.     Tenderness: There is no abdominal tenderness.  Skin:    General: Skin is warm and dry.  Neurological:     Mental Status: She is alert and oriented to person, place, and time.  Psychiatric:        Mood and Affect: Mood normal.        Behavior: Behavior normal.        Thought Content: Thought content normal.        Judgment: Judgment normal.      MD Evaluation Airway: WNL Heart: WNL Abdomen: WNL Chest/ Lungs: WNL ASA  Classification: 2 Mallampati/Airway Score: One   Imaging: MR Pelvis W Wo Contrast Result Date: 12/05/2023 CLINICAL DATA:  Cervical cancer staging. EXAM: MRI PELVIS WITHOUT AND WITH CONTRAST TECHNIQUE: Multiplanar multisequence MR imaging of the pelvis was performed both before and after administration of intravenous  contrast. CONTRAST:  5.23mL GADAVIST GADOBUTROL 1 MMOL/ML IV SOLN COMPARISON:  PET-CT 12/01/2023 FINDINGS: Urinary Tract:  The urinary bladder appears normal. Bowel: No dilated loops of large or small bowel. No bowel wall thickening or inflammation. Vascular/Lymphatic: No pathologically enlarged lymph nodes. No significant vascular abnormality seen. Reproductive: Left fundal submucosal fibroid shows diffuse enhancement measuring 2.2 x 2.2 cm, image 38/21. Right lateral cervical mass is identified measuring 4.9 x 4.0 by 4.0 cm, image 37/15 and image 16/19. Positive for parametrial invasion. Tumor extension into the upper 2/3 of the vagina is visualized, image 18/19. There is  tumor extension up to the rectum with loss of fat plane between the anterior wall of the rectum and the mass, image 18/19.Anterior extension of the tumor extends up to the posterior wall of the bladder with focal area of loss of fat plane seen on image 45 of series 21. Cannot exclude bladder involvement. No signs of hydroureter. Other:  No ascites identified.  No peritoneal nodules. Musculoskeletal: No suspicious bone lesions identified. IMPRESSION: 1. Right lateral cervical mass is identified measuring 4.9 x 4.0 x 4.0 cm. Positive for parametrial invasion. Tumor extension into the upper 2/3 of the vagina is visualized. There is tumor extension up to the rectum with loss of fat plane between the anterior wall of the rectum and the mass. Anterior extension of the tumor extends up to the posterior wall of the bladder with focal area of loss of fat plane seen. Cannot exclude bladder involvement. 2. No signs of hydroureter. 3. No signs of pelvic lymphadenopathy or peritoneal nodularity. 4. Left fundal submucosal fibroid shows diffuse enhancement measuring 2.2 x 2.2 cm. Electronically Signed   By: Signa Kell M.D.   On: 12/05/2023 14:10   NM PET Image Initial (PI) Skull Base To Thigh (F-18 FDG) Result Date: 12/01/2023 CLINICAL DATA:  Initial  treatment strategy for cervical cancer. EXAM: NUCLEAR MEDICINE PET SKULL BASE TO THIGH TECHNIQUE: 6.8 mCi F-18 FDG was injected intravenously. Full-ring PET imaging was performed from the skull base to thigh after the radiotracer. CT data was obtained and used for attenuation correction and anatomic localization. Fasting blood glucose: 120 mg/dl COMPARISON:  None Available. FINDINGS: Mediastinal blood pool activity: SUV max 2.5 Liver activity: SUV max NA NECK: No abnormal hypermetabolism. Incidental CT findings: None. CHEST: No abnormal hypermetabolism. Incidental CT findings: Atherosclerotic calcification of the aorta and left anterior descending coronary artery. Heart is at the upper limits of normal in size to mildly enlarged. No pericardial or pleural effusion. ABDOMEN/PELVIS: A mass involving the cervix, and likely lower uterine segment, measures approximately 3.8 x 4.6 cm (4/156), SUV max 25.9. No additional abnormal hypermetabolism. Incidental CT findings: Cholecystectomy. Kidneys appear non rotated, an anatomical variant. Atherosclerotic calcification of the aorta. SKELETON: No abnormal hypermetabolism. Incidental CT findings: Minimal degenerative change in the spine. IMPRESSION: 1. Hypermetabolic cervical mass, compatible with the provided history of cervical cancer. No evidence of metastatic disease. 2. Aortic atherosclerosis (ICD10-I70.0). Coronary artery calcification. Electronically Signed   By: Leanna Battles M.D.   On: 12/01/2023 16:15    Labs:  CBC: Recent Labs    12/09/23 1600  WBC 7.2  HGB 14.6  HCT 43.9  PLT 282    COAGS: No results for input(s): "INR", "APTT" in the last 8760 hours.  BMP: No results for input(s): "NA", "K", "CL", "CO2", "GLUCOSE", "BUN", "CALCIUM", "CREATININE", "GFRNONAA", "GFRAA" in the last 8760 hours.  Invalid input(s): "CMP"  LIVER FUNCTION TESTS: No results for input(s): "BILITOT", "AST", "ALT", "ALKPHOS", "PROT", "ALBUMIN" in the last 8760  hours.  TUMOR MARKERS: No results for input(s): "AFPTM", "CEA", "CA199", "CHROMGRNA" in the last 8760 hours.  Assessment and Plan:  68 y/o F with recently diagnosed cervical cancer who presents today for port placement prior to initiation of chemotherapy.  Risks and benefits of image-guided Port-a-catheter placement were discussed with the patient including, but not limited to bleeding, infection, pneumothorax, or fibrin sheath development and need for additional procedures.  All of the patient's questions were answered, patient is agreeable to proceed.  Consent signed by patient's husband per her request due  to IV placement in right arm and placed in chart.  Thank you for this interesting consult.  I greatly enjoyed meeting MAMA PODOLAK and look forward to participating in their care.  A copy of this report was sent to the requesting provider on this date.  Electronically Signed: Villa Herb, PA-C 12/11/2023, 1:34 PM   I spent a total of 30 Minutes  in face to face in clinical consultation, greater than 50% of which was counseling/coordinating care for cervical cancer.

## 2023-12-11 NOTE — Procedures (Signed)
Interventional Radiology Procedure Note  Procedure: Placement of a right IJ approach single lumen PowerPort.  Tip is positioned at the superior cavoatrial junction and catheter is ready for immediate use.  Complications: No immediate Recommendations:  - Ok to shower tomorrow - Do not submerge for 7 days - Routine line care   Signed,  Obi Scrima K. Olanda Boughner, MD   

## 2023-12-12 ENCOUNTER — Other Ambulatory Visit: Payer: Self-pay

## 2023-12-12 ENCOUNTER — Ambulatory Visit
Admission: RE | Admit: 2023-12-12 | Discharge: 2023-12-12 | Disposition: A | Discharge status: Home / Self Care | Payer: Medicare Other | Source: Ambulatory Visit | Attending: Radiation Oncology | Admitting: Radiation Oncology

## 2023-12-12 DIAGNOSIS — R300 Dysuria: Secondary | ICD-10-CM | POA: Diagnosis not present

## 2023-12-12 DIAGNOSIS — Z923 Personal history of irradiation: Secondary | ICD-10-CM | POA: Diagnosis not present

## 2023-12-12 DIAGNOSIS — Z510 Encounter for antineoplastic radiation therapy: Secondary | ICD-10-CM | POA: Diagnosis not present

## 2023-12-12 DIAGNOSIS — Z5111 Encounter for antineoplastic chemotherapy: Secondary | ICD-10-CM | POA: Diagnosis not present

## 2023-12-12 LAB — RAD ONC ARIA SESSION SUMMARY
Course Elapsed Days: 1
Plan Fractions Treated to Date: 2
Plan Prescribed Dose Per Fraction: 1.8 Gy
Plan Total Fractions Prescribed: 5
Plan Total Prescribed Dose: 9 Gy
Reference Point Dosage Given to Date: 3.6 Gy
Reference Point Session Dosage Given: 1.8 Gy
Session Number: 2

## 2023-12-12 NOTE — Progress Notes (Signed)
Pharmacist Chemotherapy Monitoring - Initial Assessment    Anticipated start date: 12/19/23   The following has been reviewed per standard work regarding the patient's treatment regimen: The patient's diagnosis, treatment plan and drug doses, and organ/hematologic function Lab orders and baseline tests specific to treatment regimen  The treatment plan start date, drug sequencing, and pre-medications Prior authorization status  Patient's documented medication list, including drug-drug interaction screen and prescriptions for anti-emetics and supportive care specific to the treatment regimen The drug concentrations, fluid compatibility, administration routes, and timing of the medications to be used The patient's access for treatment and lifetime cumulative dose history, if applicable  The patient's medication allergies and previous infusion related reactions, if applicable   Changes made to treatment plan:  N/A  Follow up needed:  Pending authorization for treatment    Drusilla Kanner, PharmD, MBA

## 2023-12-15 ENCOUNTER — Ambulatory Visit
Admission: RE | Admit: 2023-12-15 | Discharge: 2023-12-15 | Disposition: A | Discharge status: Home / Self Care | Payer: Medicare Other | Source: Ambulatory Visit | Attending: Radiation Oncology | Admitting: Radiation Oncology

## 2023-12-15 ENCOUNTER — Other Ambulatory Visit: Payer: Self-pay

## 2023-12-15 DIAGNOSIS — Z923 Personal history of irradiation: Secondary | ICD-10-CM | POA: Diagnosis not present

## 2023-12-15 DIAGNOSIS — R300 Dysuria: Secondary | ICD-10-CM | POA: Diagnosis not present

## 2023-12-15 DIAGNOSIS — Z510 Encounter for antineoplastic radiation therapy: Secondary | ICD-10-CM | POA: Diagnosis not present

## 2023-12-15 DIAGNOSIS — Z5111 Encounter for antineoplastic chemotherapy: Secondary | ICD-10-CM | POA: Diagnosis not present

## 2023-12-15 LAB — RAD ONC ARIA SESSION SUMMARY
Course Elapsed Days: 4
Plan Fractions Treated to Date: 3
Plan Prescribed Dose Per Fraction: 1.8 Gy
Plan Total Fractions Prescribed: 5
Plan Total Prescribed Dose: 9 Gy
Reference Point Dosage Given to Date: 5.4 Gy
Reference Point Session Dosage Given: 1.8 Gy
Session Number: 3

## 2023-12-16 ENCOUNTER — Ambulatory Visit: Payer: Medicare Other

## 2023-12-16 ENCOUNTER — Ambulatory Visit
Admission: RE | Admit: 2023-12-16 | Discharge: 2023-12-16 | Disposition: A | Discharge status: Home / Self Care | Payer: Medicare Other | Source: Ambulatory Visit | Attending: Radiation Oncology | Admitting: Radiation Oncology

## 2023-12-16 ENCOUNTER — Other Ambulatory Visit: Payer: Self-pay

## 2023-12-16 ENCOUNTER — Telehealth: Payer: Self-pay | Admitting: Oncology

## 2023-12-16 DIAGNOSIS — R300 Dysuria: Secondary | ICD-10-CM

## 2023-12-16 DIAGNOSIS — Z510 Encounter for antineoplastic radiation therapy: Secondary | ICD-10-CM | POA: Diagnosis not present

## 2023-12-16 DIAGNOSIS — Z923 Personal history of irradiation: Secondary | ICD-10-CM | POA: Diagnosis not present

## 2023-12-16 DIAGNOSIS — Z5111 Encounter for antineoplastic chemotherapy: Secondary | ICD-10-CM | POA: Diagnosis not present

## 2023-12-16 LAB — RAD ONC ARIA SESSION SUMMARY
Course Elapsed Days: 5
Plan Fractions Treated to Date: 4
Plan Prescribed Dose Per Fraction: 1.8 Gy
Plan Total Fractions Prescribed: 5
Plan Total Prescribed Dose: 9 Gy
Reference Point Dosage Given to Date: 7.2 Gy
Reference Point Session Dosage Given: 1.8 Gy
Session Number: 4

## 2023-12-16 LAB — URINALYSIS, COMPLETE (UACMP) WITH MICROSCOPIC
Bilirubin Urine: NEGATIVE
Glucose, UA: NEGATIVE mg/dL
Ketones, ur: NEGATIVE mg/dL
Nitrite: NEGATIVE
Protein, ur: NEGATIVE mg/dL
Specific Gravity, Urine: 1.004 — ABNORMAL LOW (ref 1.005–1.030)
WBC, UA: >50 WBC/hpf (ref 0–5)
pH: 5 (ref 5.0–8.0)

## 2023-12-16 NOTE — Telephone Encounter (Signed)
Left a message for Angie at [facility] regarding PD-L1 results and LVI on speciman ID [id number]. Requested a return call.

## 2023-12-17 ENCOUNTER — Other Ambulatory Visit: Payer: Self-pay

## 2023-12-17 ENCOUNTER — Ambulatory Visit
Admission: RE | Admit: 2023-12-17 | Discharge: 2023-12-17 | Disposition: A | Discharge status: Home / Self Care | Payer: Medicare Other | Source: Ambulatory Visit | Attending: Radiation Oncology | Admitting: Radiation Oncology

## 2023-12-17 DIAGNOSIS — Z510 Encounter for antineoplastic radiation therapy: Secondary | ICD-10-CM | POA: Diagnosis not present

## 2023-12-17 DIAGNOSIS — R300 Dysuria: Secondary | ICD-10-CM | POA: Diagnosis not present

## 2023-12-17 DIAGNOSIS — C530 Malignant neoplasm of endocervix: Secondary | ICD-10-CM | POA: Diagnosis present

## 2023-12-17 DIAGNOSIS — Z5111 Encounter for antineoplastic chemotherapy: Secondary | ICD-10-CM | POA: Diagnosis not present

## 2023-12-17 DIAGNOSIS — Z923 Personal history of irradiation: Secondary | ICD-10-CM | POA: Diagnosis not present

## 2023-12-17 LAB — RAD ONC ARIA SESSION SUMMARY
Course Elapsed Days: 6
Plan Fractions Treated to Date: 5
Plan Prescribed Dose Per Fraction: 1.8 Gy
Plan Total Fractions Prescribed: 5
Plan Total Prescribed Dose: 9 Gy
Reference Point Dosage Given to Date: 9 Gy
Reference Point Session Dosage Given: 1.8 Gy
Session Number: 5

## 2023-12-17 LAB — URINE CULTURE

## 2023-12-18 ENCOUNTER — Encounter: Payer: Self-pay | Admitting: Hematology and Oncology

## 2023-12-18 ENCOUNTER — Inpatient Hospital Stay: Payer: Medicare Other | Admitting: Hematology and Oncology

## 2023-12-18 ENCOUNTER — Ambulatory Visit
Admission: RE | Admit: 2023-12-18 | Discharge: 2023-12-18 | Disposition: A | Discharge status: Home / Self Care | Payer: Medicare Other | Source: Ambulatory Visit | Attending: Radiation Oncology | Admitting: Radiation Oncology

## 2023-12-18 ENCOUNTER — Other Ambulatory Visit: Payer: Self-pay

## 2023-12-18 ENCOUNTER — Inpatient Hospital Stay: Payer: Medicare Other

## 2023-12-18 ENCOUNTER — Telehealth: Payer: Self-pay | Admitting: Oncology

## 2023-12-18 VITALS — BP 129/76 | HR 87 | Temp 98.5°F | Resp 18 | Ht 60.0 in | Wt 119.8 lb

## 2023-12-18 DIAGNOSIS — Z5111 Encounter for antineoplastic chemotherapy: Secondary | ICD-10-CM | POA: Diagnosis not present

## 2023-12-18 DIAGNOSIS — C530 Malignant neoplasm of endocervix: Secondary | ICD-10-CM | POA: Diagnosis not present

## 2023-12-18 DIAGNOSIS — C539 Malignant neoplasm of cervix uteri, unspecified: Secondary | ICD-10-CM

## 2023-12-18 DIAGNOSIS — Z923 Personal history of irradiation: Secondary | ICD-10-CM | POA: Diagnosis not present

## 2023-12-18 DIAGNOSIS — Z510 Encounter for antineoplastic radiation therapy: Secondary | ICD-10-CM | POA: Diagnosis not present

## 2023-12-18 DIAGNOSIS — R300 Dysuria: Secondary | ICD-10-CM

## 2023-12-18 LAB — BASIC METABOLIC PANEL - CANCER CENTER ONLY
Anion gap: 6 (ref 5–15)
BUN: 11 mg/dL (ref 8–23)
CO2: 30 mmol/L (ref 22–32)
Calcium: 10.2 mg/dL (ref 8.9–10.3)
Chloride: 100 mmol/L (ref 98–111)
Creatinine: 0.83 mg/dL (ref 0.44–1.00)
GFR, Estimated: >60 mL/min (ref >60)
Glucose, Bld: 95 mg/dL (ref 70–99)
Potassium: 4.1 mmol/L (ref 3.5–5.1)
Sodium: 136 mmol/L (ref 135–145)

## 2023-12-18 LAB — CBC WITH DIFFERENTIAL (CANCER CENTER ONLY)
Abs Immature Granulocytes: 0.05 10*3/uL (ref 0.00–0.07)
Basophils Absolute: 0.1 10*3/uL (ref 0.0–0.1)
Basophils Relative: 1 %
Eosinophils Absolute: 0.1 10*3/uL (ref 0.0–0.5)
Eosinophils Relative: 1 %
HCT: 41.6 % (ref 36.0–46.0)
Hemoglobin: 14 g/dL (ref 12.0–15.0)
Immature Granulocytes: 1 %
Lymphocytes Relative: 13 %
Lymphs Abs: 0.7 10*3/uL (ref 0.7–4.0)
MCH: 31.6 pg (ref 26.0–34.0)
MCHC: 33.7 g/dL (ref 30.0–36.0)
MCV: 93.9 fL (ref 80.0–100.0)
Monocytes Absolute: 0.4 10*3/uL (ref 0.1–1.0)
Monocytes Relative: 7 %
Neutro Abs: 4 10*3/uL (ref 1.7–7.7)
Neutrophils Relative %: 77 %
Platelet Count: 237 10*3/uL (ref 150–400)
RBC: 4.43 MIL/uL (ref 3.87–5.11)
RDW: 12.7 % (ref 11.5–15.5)
WBC Count: 5.2 10*3/uL (ref 4.0–10.5)
nRBC: 0 % (ref 0.0–0.2)

## 2023-12-18 LAB — RAD ONC ARIA SESSION SUMMARY
Course Elapsed Days: 7
Plan Fractions Treated to Date: 1
Plan Prescribed Dose Per Fraction: 1.8 Gy
Plan Total Fractions Prescribed: 20
Plan Total Prescribed Dose: 36 Gy
Reference Point Dosage Given to Date: 1.8 Gy
Reference Point Session Dosage Given: 1.8 Gy
Session Number: 6

## 2023-12-18 LAB — URINALYSIS, COMPLETE (UACMP) WITH MICROSCOPIC
Bilirubin Urine: NEGATIVE
Glucose, UA: NEGATIVE mg/dL
Ketones, ur: NEGATIVE mg/dL
Nitrite: NEGATIVE
Protein, ur: NEGATIVE mg/dL
Specific Gravity, Urine: 1.003 — ABNORMAL LOW (ref 1.005–1.030)
pH: 6 (ref 5.0–8.0)

## 2023-12-18 LAB — MAGNESIUM: Magnesium: 2.1 mg/dL (ref 1.7–2.4)

## 2023-12-18 LAB — HIV ANTIBODY (ROUTINE TESTING W REFLEX): HIV Screen 4th Generation wRfx: NONREACTIVE

## 2023-12-18 MED FILL — Fosaprepitant Dimeglumine For IV Infusion 150 MG (Base Eq): INTRAVENOUS | Qty: 5 | Status: AC

## 2023-12-18 NOTE — Telephone Encounter (Signed)
Left another message for Angie at [facility] regarding PD-L1 testing. Requested a return call.

## 2023-12-18 NOTE — Assessment & Plan Note (Signed)
She has significant bleeding recently Despite that, her blood count is stable We discussed side effects to be expected from treatment She will start chemotherapy tomorrow I will see her weekly with blood count monitoring

## 2023-12-18 NOTE — Progress Notes (Signed)
[facility] OFFICE PROGRESS NOTE  Patient Care Team: Benita Stabile, MD as PCP - General (Internal Medicine)  ASSESSMENT & PLAN:  Cervical cancer [facility]) She has significant bleeding recently Despite that, her blood count is stable We discussed side effects to be expected from treatment She will start chemotherapy tomorrow I will see her weekly with blood count monitoring  No orders of the defined types were placed in this encounter.   All questions were answered. The patient knows to call the clinic with any problems, questions or concerns. The total time spent in the appointment was 20 minutes encounter with patients including review of chart and various tests results, discussions about plan of care and coordination of care plan   Artis Delay, MD 12/18/2023 1:23 PM  INTERVAL HISTORY: Please see below for problem oriented charting. she returns for chemo follow-up She had recent significant bleeding She has started radiation therapy Denies other sites of bleeding We discussed side effects to be expected from treatment  REVIEW OF SYSTEMS:   Constitutional: Denies fevers, chills or abnormal weight loss Eyes: Denies blurriness of vision Ears, nose, mouth, throat, and face: Denies mucositis or sore throat Respiratory: Denies cough, dyspnea or wheezes Cardiovascular: Denies palpitation, chest discomfort or lower extremity swelling Gastrointestinal:  Denies nausea, heartburn or change in bowel habits Skin: Denies abnormal skin rashes Lymphatics: Denies new lymphadenopathy or easy bruising Neurological:Denies numbness, tingling or new weaknesses Behavioral/Psych: Mood is stable, no new changes  All other systems were reviewed with the patient and are negative.  I have reviewed the past medical history, past surgical history, social history and family history with the patient and they are unchanged from previous note.  ALLERGIES:  has no known allergies.  MEDICATIONS:   Current Outpatient Medications  Medication Sig Dispense Refill   atorvastatin (LIPITOR) 10 MG tablet Take 10 mg by mouth 3 (three) times a week.     calcium-vitamin D (OSCAL WITH D) 500-200 MG-UNIT per tablet Take 1 tablet by mouth daily.     lidocaine-prilocaine (EMLA) cream Apply to affected area once (Patient not taking: Reported on 12/03/2023) 30 g 3   Multiple Vitamins-Minerals (MULTIVITAMIN WOMEN PO) Take 1 tablet by mouth daily.     ondansetron (ZOFRAN) 8 MG tablet Take 1 tablet (8 mg total) by mouth every 8 (eight) hours as needed for nausea or vomiting. Start on the third day after cisplatin. (Patient not taking: Reported on 12/03/2023) 30 tablet 1   oxyCODONE-acetaminophen (PERCOCET/ROXICET) 5-325 MG tablet Take 1 tablet by mouth every 6 (six) hours as needed for severe pain (pain score 7-10). 20 tablet 0   phenazopyridine (PYRIDIUM) 200 MG tablet Take 1 tablet (200 mg total) by mouth 3 (three) times daily as needed for pain. 20 tablet 1   prochlorperazine (COMPAZINE) 10 MG tablet Take 1 tablet (10 mg total) by mouth every 6 (six) hours as needed (Nausea or vomiting). (Patient not taking: Reported on 12/03/2023) 30 tablet 1   No current facility-administered medications for this visit.    SUMMARY OF ONCOLOGIC HISTORY: Oncology History  Cervical cancer (HCC)  09/19/2023 Initial Diagnosis   Patient developed discharge in 08/2023 with postmenopausal bleeding starting in 09/2023.    11/07/2023 Initial Biopsy   Report available at [facility] Cervical mass biopsy: Poorly differentiated invasive squamous cell carcinoma   11/07/2023 Imaging   Office ultrasound shows a uterus measuring 6.4 x 4 x 2.1 cm with an endometrial lining measuring 0.9 mm with small endometrial fluid.  Normal  bilateral adnexa.  Solid, inhomogenous lesion within the cervix measuring 3.8 x 3.2 x 3.8 cm.   11/20/2023 Initial Diagnosis   Cervical cancer (HCC)   12/01/2023 Cancer Staging   Staging form: Cervix Uteri, AJCC  Version 9 - Clinical stage from 12/01/2023: FIGO Stage IIB (cT2b, cN0, cM0) - Signed by Artis Delay, MD on 12/01/2023 Stage prefix: Initial diagnosis   12/01/2023 PET scan   NM PET Image Initial (PI) Skull Base To Thigh (F-18 FDG) Result Date: 12/01/2023 CLINICAL DATA:  Initial treatment strategy for cervical cancer. EXAM: NUCLEAR MEDICINE PET SKULL BASE TO THIGH TECHNIQUE: 6.8 mCi F-18 FDG was injected intravenously. Full-ring PET imaging was performed from the skull base to thigh after the radiotracer. CT data was obtained and used for attenuation correction and anatomic localization. Fasting blood glucose: 120 mg/dl COMPARISON:  None Available. FINDINGS: Mediastinal blood pool activity: SUV max 2.5 Liver activity: SUV max NA NECK: No abnormal hypermetabolism. Incidental CT findings: None. CHEST: No abnormal hypermetabolism. Incidental CT findings: Atherosclerotic calcification of the aorta and left anterior descending coronary artery. Heart is at the upper limits of normal in size to mildly enlarged. No pericardial or pleural effusion. ABDOMEN/PELVIS: A mass involving the cervix, and likely lower uterine segment, measures approximately 3.8 x 4.6 cm (4/156), SUV max 25.9. No additional abnormal hypermetabolism. Incidental CT findings: Cholecystectomy. Kidneys appear non rotated, an anatomical variant. Atherosclerotic calcification of the aorta. SKELETON: No abnormal hypermetabolism. Incidental CT findings: Minimal degenerative change in the spine. IMPRESSION: 1. Hypermetabolic cervical mass, compatible with the provided history of cervical cancer. No evidence of metastatic disease. 2. Aortic atherosclerosis (ICD10-I70.0). Coronary artery calcification. Electronically Signed   By: Leanna Battles M.D.   On: 12/01/2023 16:15      12/05/2023 Imaging   1. Right lateral cervical mass is identified measuring 4.9 x 4.0 x 4.0 cm. Positive for parametrial invasion. Tumor extension into the upper 2/3 of the vagina  is visualized. There is tumor extension up to the rectum with loss of fat plane between the anterior wall of the rectum and the mass. Anterior extension of the tumor extends up to the posterior wall of the bladder with focal area of loss of fat plane seen. Cannot exclude bladder involvement. 2. No signs of hydroureter. 3. No signs of pelvic lymphadenopathy or peritoneal nodularity. 4. Left fundal submucosal fibroid shows diffuse enhancement measuring 2.2 x 2.2 cm   12/11/2023 Procedure   Successful placement of a right IJ approach Power Port with ultrasound and fluoroscopic guidance. The catheter is ready for use.   12/19/2023 -  Chemotherapy   Patient is on Treatment Plan : cervical cancer Cisplatin (40) q7d       PHYSICAL EXAMINATION: ECOG PERFORMANCE STATUS: 1 - Symptomatic but completely ambulatory  Vitals:   12/18/23 1239  BP: 129/76  Pulse: 87  Resp: 18  Temp: 98.5 F (36.9 C)  SpO2: 98%   Filed Weights   12/18/23 1239  Weight: 119 lb 12.8 oz (54.3 kg)    GENERAL:alert, no distress and comfortable  LABORATORY DATA:  I have reviewed the data as listed    Component Value Date/Time   NA 136 12/18/2023 1221   K 4.1 12/18/2023 1221   CL 100 12/18/2023 1221   CO2 30 12/18/2023 1221   GLUCOSE 95 12/18/2023 1221   BUN 11 12/18/2023 1221   CREATININE 0.83 12/18/2023 1221   CALCIUM 10.2 12/18/2023 1221   GFRNONAA >60 12/18/2023 1221   GFRAA 73 (L) 11/09/2014 0719  No results found for: "SPEP", "UPEP"  Lab Results  Component Value Date   WBC 5.2 12/18/2023   NEUTROABS 4.0 12/18/2023   HGB 14.0 12/18/2023   HCT 41.6 12/18/2023   MCV 93.9 12/18/2023   PLT 237 12/18/2023      Chemistry      Component Value Date/Time   NA 136 12/18/2023 1221   K 4.1 12/18/2023 1221   CL 100 12/18/2023 1221   CO2 30 12/18/2023 1221   BUN 11 12/18/2023 1221   CREATININE 0.83 12/18/2023 1221      Component Value Date/Time   CALCIUM 10.2 12/18/2023 1221        RADIOGRAPHIC STUDIES: I have personally reviewed the radiological images as listed and agreed with the findings in the report. IR IMAGING GUIDED PORT INSERTION Result Date: 12/11/2023 INDICATION: 68 year old female with cervical cancer. She presents for port catheter placement to allow for chemotherapy. EXAM: IMPLANTED PORT A CATH PLACEMENT WITH ULTRASOUND AND FLUOROSCOPIC GUIDANCE MEDICATIONS: None. ANESTHESIA/SEDATION: Versed 2 mg IV; Fentanyl 75 mcg IV; administered by the radiology nurse. Moderate Sedation Time:  20 minutes The patient's vital signs and level of consciousness were continuously monitored during the procedure by the interventional radiology nurse under my direct supervision. FLUOROSCOPY: Radiation exposure index: Less than 1 mGy reference air kerma COMPLICATIONS: None immediate. PROCEDURE: The right neck and chest was prepped with chlorhexidine, and draped in the usual sterile fashion using maximum barrier technique (cap and mask, sterile gown, sterile gloves, large sterile sheet, hand hygiene and cutaneous antiseptic). Local anesthesia was attained by infiltration with 1% lidocaine with epinephrine. Ultrasound demonstrated patency of the right internal jugular vein, and this was documented with an image. Under real-time ultrasound guidance, this vein was accessed with a 21 gauge micropuncture needle and image documentation was performed. A small dermatotomy was made at the access site with an 11 scalpel. A 0.018" wire was advanced into the SVC and the access needle exchanged for a 6F micropuncture vascular sheath. The 0.018" wire was then removed and a 0.035" wire advanced into the IVC. An appropriate location for the subcutaneous reservoir was selected below the clavicle and an incision was made through the skin and underlying soft tissues. The subcutaneous tissues were then dissected using a combination of blunt and sharp surgical technique and a pocket was formed. A single lumen  power injectable portacatheter was then tunneled through the subcutaneous tissues from the pocket to the dermatotomy and the port reservoir placed within the subcutaneous pocket. The venous access site was then serially dilated and a peel away vascular sheath placed over the wire. The wire was removed and the port catheter advanced into position under fluoroscopic guidance. The catheter tip is positioned in the superior cavoatrial junction. This was documented with a spot image. The portacatheter was then tested and found to flush and aspirate well. The port was flushed with saline followed by 100 units/mL heparinized saline. The pocket was then closed in two layers using first subdermal inverted interrupted absorbable sutures followed by a running subcuticular suture. The epidermis was then sealed with Dermabond. The dermatotomy at the venous access site was also closed with Dermabond. IMPRESSION: Successful placement of a right IJ approach Power Port with ultrasound and fluoroscopic guidance. The catheter is ready for use. Electronically Signed   By: Malachy Moan M.D.   On: 12/11/2023 16:03   MR Pelvis W Wo Contrast Result Date: 12/05/2023 CLINICAL DATA:  Cervical cancer staging. EXAM: MRI PELVIS WITHOUT AND WITH CONTRAST TECHNIQUE:  Multiplanar multisequence MR imaging of the pelvis was performed both before and after administration of intravenous contrast. CONTRAST:  5.45mL GADAVIST GADOBUTROL 1 MMOL/ML IV SOLN COMPARISON:  PET-CT 12/01/2023 FINDINGS: Urinary Tract:  The urinary bladder appears normal. Bowel: No dilated loops of large or small bowel. No bowel wall thickening or inflammation. Vascular/Lymphatic: No pathologically enlarged lymph nodes. No significant vascular abnormality seen. Reproductive: Left fundal submucosal fibroid shows diffuse enhancement measuring 2.2 x 2.2 cm, image 38/21. Right lateral cervical mass is identified measuring 4.9 x 4.0 by 4.0 cm, image 37/15 and image 16/19. Positive  for parametrial invasion. Tumor extension into the upper 2/3 of the vagina is visualized, image 18/19. There is tumor extension up to the rectum with loss of fat plane between the anterior wall of the rectum and the mass, image 18/19.Anterior extension of the tumor extends up to the posterior wall of the bladder with focal area of loss of fat plane seen on image 45 of series 21. Cannot exclude bladder involvement. No signs of hydroureter. Other:  No ascites identified.  No peritoneal nodules. Musculoskeletal: No suspicious bone lesions identified. IMPRESSION: 1. Right lateral cervical mass is identified measuring 4.9 x 4.0 x 4.0 cm. Positive for parametrial invasion. Tumor extension into the upper 2/3 of the vagina is visualized. There is tumor extension up to the rectum with loss of fat plane between the anterior wall of the rectum and the mass. Anterior extension of the tumor extends up to the posterior wall of the bladder with focal area of loss of fat plane seen. Cannot exclude bladder involvement. 2. No signs of hydroureter. 3. No signs of pelvic lymphadenopathy or peritoneal nodularity. 4. Left fundal submucosal fibroid shows diffuse enhancement measuring 2.2 x 2.2 cm. Electronically Signed   By: Signa Kell M.D.   On: 12/05/2023 14:10   NM PET Image Initial (PI) Skull Base To Thigh (F-18 FDG) Result Date: 12/01/2023 CLINICAL DATA:  Initial treatment strategy for cervical cancer. EXAM: NUCLEAR MEDICINE PET SKULL BASE TO THIGH TECHNIQUE: 6.8 mCi F-18 FDG was injected intravenously. Full-ring PET imaging was performed from the skull base to thigh after the radiotracer. CT data was obtained and used for attenuation correction and anatomic localization. Fasting blood glucose: 120 mg/dl COMPARISON:  None Available. FINDINGS: Mediastinal blood pool activity: SUV max 2.5 Liver activity: SUV max NA NECK: No abnormal hypermetabolism. Incidental CT findings: None. CHEST: No abnormal hypermetabolism. Incidental CT  findings: Atherosclerotic calcification of the aorta and left anterior descending coronary artery. Heart is at the upper limits of normal in size to mildly enlarged. No pericardial or pleural effusion. ABDOMEN/PELVIS: A mass involving the cervix, and likely lower uterine segment, measures approximately 3.8 x 4.6 cm (4/156), SUV max 25.9. No additional abnormal hypermetabolism. Incidental CT findings: Cholecystectomy. Kidneys appear non rotated, an anatomical variant. Atherosclerotic calcification of the aorta. SKELETON: No abnormal hypermetabolism. Incidental CT findings: Minimal degenerative change in the spine. IMPRESSION: 1. Hypermetabolic cervical mass, compatible with the provided history of cervical cancer. No evidence of metastatic disease. 2. Aortic atherosclerosis (ICD10-I70.0). Coronary artery calcification. Electronically Signed   By: Leanna Battles M.D.   On: 12/01/2023 16:15

## 2023-12-19 ENCOUNTER — Inpatient Hospital Stay: Payer: Medicare Other

## 2023-12-19 ENCOUNTER — Inpatient Hospital Stay: Payer: Medicare Other | Admitting: Licensed Clinical Social Worker

## 2023-12-19 ENCOUNTER — Other Ambulatory Visit: Payer: Self-pay

## 2023-12-19 ENCOUNTER — Ambulatory Visit
Admission: RE | Admit: 2023-12-19 | Discharge: 2023-12-19 | Disposition: A | Discharge status: Home / Self Care | Payer: Medicare Other | Source: Ambulatory Visit | Attending: Radiation Oncology | Admitting: Radiation Oncology

## 2023-12-19 VITALS — BP 121/67 | HR 77 | Temp 98.4°F | Resp 17

## 2023-12-19 DIAGNOSIS — C530 Malignant neoplasm of endocervix: Secondary | ICD-10-CM

## 2023-12-19 DIAGNOSIS — Z923 Personal history of irradiation: Secondary | ICD-10-CM | POA: Diagnosis not present

## 2023-12-19 DIAGNOSIS — Z5111 Encounter for antineoplastic chemotherapy: Secondary | ICD-10-CM | POA: Diagnosis not present

## 2023-12-19 DIAGNOSIS — Z510 Encounter for antineoplastic radiation therapy: Secondary | ICD-10-CM | POA: Diagnosis not present

## 2023-12-19 DIAGNOSIS — R300 Dysuria: Secondary | ICD-10-CM | POA: Diagnosis not present

## 2023-12-19 LAB — RAD ONC ARIA SESSION SUMMARY
Course Elapsed Days: 8
Plan Fractions Treated to Date: 2
Plan Prescribed Dose Per Fraction: 1.8 Gy
Plan Total Fractions Prescribed: 20
Plan Total Prescribed Dose: 36 Gy
Reference Point Dosage Given to Date: 3.6 Gy
Reference Point Session Dosage Given: 1.8 Gy
Session Number: 7

## 2023-12-19 LAB — URINE CULTURE

## 2023-12-19 MED ORDER — SODIUM CHLORIDE 0.9 % IV SOLN
40.0000 mg/m2 | Freq: Once | INTRAVENOUS | Status: AC
  Administered 2023-12-19: 61 mg via INTRAVENOUS
  Filled 2023-12-19: qty 61

## 2023-12-19 MED ORDER — SODIUM CHLORIDE 0.9 % IV SOLN
150.0000 mg | Freq: Once | INTRAVENOUS | Status: AC
  Administered 2023-12-19: 150 mg via INTRAVENOUS
  Filled 2023-12-19: qty 150

## 2023-12-19 MED ORDER — SODIUM CHLORIDE 0.9 % IV SOLN: INTRAVENOUS | Status: DC

## 2023-12-19 MED ORDER — MAGNESIUM SULFATE 2 GM/50ML IV SOLN
2.0000 g | Freq: Once | INTRAVENOUS | Status: AC
  Administered 2023-12-19: 2 g via INTRAVENOUS
  Filled 2023-12-19: qty 50

## 2023-12-19 MED ORDER — DEXAMETHASONE SODIUM PHOSPHATE 10 MG/ML IJ SOLN
10.0000 mg | Freq: Once | INTRAMUSCULAR | Status: AC
  Administered 2023-12-19: 10 mg via INTRAVENOUS
  Filled 2023-12-19: qty 1

## 2023-12-19 MED ORDER — POTASSIUM CHLORIDE IN NACL 20-0.9 MEQ/L-% IV SOLN
Freq: Once | INTRAVENOUS | Status: AC
  Filled 2023-12-19: qty 1000

## 2023-12-19 MED ORDER — PALONOSETRON HCL INJECTION 0.25 MG/5ML
0.2500 mg | Freq: Once | INTRAVENOUS | Status: AC
  Administered 2023-12-19: 0.25 mg via INTRAVENOUS
  Filled 2023-12-19: qty 5

## 2023-12-19 NOTE — Patient Instructions (Signed)
 [facility] - A DEPT OF [facility]  Discharge Instructions: Thank you for choosing [facility] to provide your oncology and hematology care.   If you have a lab appointment with the Cancer Center, please go directly to the Cancer Center and check in at the registration area.   Wear comfortable clothing and clothing appropriate for easy access to any Portacath or PICC line.   We strive to give you quality time with your provider. You may need to reschedule your appointment if you arrive late (15 or more minutes).  Arriving late affects you and other patients whose appointments are after yours.  Also, if you miss three or more appointments without notifying the office, you may be dismissed from the clinic at the provider's discretion.      For prescription refill requests, have your pharmacy contact our office and allow 72 hours for refills to be completed.    Today you received the following chemotherapy and/or immunotherapy agents: Cisplatin.      To help prevent nausea and vomiting after your treatment, we encourage you to take your nausea medication as directed.  BELOW ARE SYMPTOMS THAT SHOULD BE REPORTED IMMEDIATELY: *FEVER GREATER THAN 100.4 F (38 C) OR HIGHER *CHILLS OR SWEATING *NAUSEA AND VOMITING THAT IS NOT CONTROLLED WITH YOUR NAUSEA MEDICATION *UNUSUAL SHORTNESS OF BREATH *UNUSUAL BRUISING OR BLEEDING *URINARY PROBLEMS (pain or burning when urinating, or frequent urination) *BOWEL PROBLEMS (unusual diarrhea, constipation, pain near the anus) TENDERNESS IN MOUTH AND THROAT WITH OR WITHOUT PRESENCE OF ULCERS (sore throat, sores in mouth, or a toothache) UNUSUAL RASH, SWELLING OR PAIN  UNUSUAL VAGINAL DISCHARGE OR ITCHING   Items with * indicate a potential emergency and should be followed up as soon as possible or go to the Emergency Department if any problems should occur.  Please show the CHEMOTHERAPY ALERT CARD or IMMUNOTHERAPY  ALERT CARD at check-in to the Emergency Department and triage nurse.  Should you have questions after your visit or need to cancel or reschedule your appointment, please contact [facility] - A DE[facility]  Dept: [phone]  and follow the prompts.  Office hours are 8:00 a.m. to 4:30 p.m. Monday - Friday. Please note that voicemails left after 4:00 p.m. may not be returned until the following business day.  We are closed weekends and major holidays. You have access to a nurse at all times for urgent questions. Please call the main number to the clinic Dept: [phone] and follow the prompts.   For any non-urgent questions, you may also contact your provider using MyChart. We now offer e-Visits for anyone 57 and older to request care online for non-urgent symptoms. For details visit [email or web address].   Also download the MyChart app! Go to the app store, search "MyChart", open the app, select [facility], and log in with your MyChart username and password.  Cisplatin Injection What is this medication? CISPLATIN (SIS pla tin) treats some types of cancer. It works by slowing down the growth of cancer cells. This medicine may be used for other purposes; ask your health care provider or pharmacist if you have questions. COMMON BRAND NAME(S): Platinol, Platinol -AQ What should I tell my care team before I take this medication? They need to know if you have any of these conditions: Eye disease, vision problems Hearing problems Kidney disease Low blood counts, such as low white cells, platelets, or red  blood cells Tingling of the fingers or toes, or other nerve disorder An unusual or allergic reaction to cisplatin, carboplatin, oxaliplatin, other medications, foods, dyes, or preservatives If you or your partner are pregnant or trying to get pregnant Breast-feeding How should I use this medication? This medication is injected into a vein. It is given  by your care team in a hospital or clinic setting. Talk to your care team about the use of this medication in children. Special care may be needed. Overdosage: If you think you have taken too much of this medicine contact a poison control center or emergency room at once. NOTE: This medicine is only for you. Do not share this medicine with others. What if I miss a dose? Keep appointments for follow-up doses. It is important not to miss your dose. Call your care team if you are unable to keep an appointment. What may interact with this medication? Do not take this medication with any of the following: Live virus vaccines This medication may also interact with the following: Certain antibiotics, such as amikacin, gentamicin, neomycin, polymyxin B, streptomycin, tobramycin, vancomycin Foscarnet This list may not describe all possible interactions. Give your health care provider a list of all the medicines, herbs, non-prescription drugs, or dietary supplements you use. Also tell them if you smoke, drink alcohol, or use illegal drugs. Some items may interact with your medicine. What should I watch for while using this medication? Your condition will be monitored carefully while you are receiving this medication. You may need blood work done while taking this medication. This medication may make you feel generally unwell. This is not uncommon, as chemotherapy can affect healthy cells as well as cancer cells. Report any side effects. Continue your course of treatment even though you feel ill unless your care team tells you to stop. This medication may increase your risk of getting an infection. Call your care team for advice if you get a fever, chills, sore throat, or other symptoms of a cold or flu. Do not treat yourself. Try to avoid being around people who are sick. Avoid taking medications that contain aspirin, acetaminophen, ibuprofen, naproxen, or ketoprofen unless instructed by your care team. These  medications may hide a fever. This medication may increase your risk to bruise or bleed. Call your care team if you notice any unusual bleeding. Be careful brushing or flossing your teeth or using a toothpick because you may get an infection or bleed more easily. If you have any dental work done, tell your dentist you are receiving this medication. Drink fluids as directed while you are taking this medication. This will help protect your kidneys. Call your care team if you get diarrhea. Do not treat yourself. Talk to your care team if you or your partner wish to become pregnant or think you might be pregnant. This medication can cause serious birth defects if taken during pregnancy and for 14 months after the last dose. A negative pregnancy test is required before starting this medication. A reliable form of contraception is recommended while taking this medication and for 14 months after the last dose. Talk to your care team about effective forms of contraception. Do not father a child while taking this medication and for 11 months after the last dose. Use a condom during sex during this time period. Do not breast-feed while taking this medication. This medication may cause infertility. Talk to your care team if you are concerned about your fertility. What side effects may I  notice from receiving this medication? Side effects that you should report to your care team as soon as possible: Allergic reactions--skin rash, itching, hives, swelling of the face, lips, tongue, or throat Eye pain, change in vision, vision loss Hearing loss, ringing in ears Infection--fever, chills, cough, sore throat, wounds that don't heal, pain or trouble when passing urine, general feeling of discomfort or being unwell Kidney injury--decrease in the amount of urine, swelling of the ankles, hands, or feet Low red blood cell level--unusual weakness or fatigue, dizziness, headache, trouble breathing Painful swelling, warmth,  or redness of the skin, blisters or sores at the infusion site Pain, tingling, or numbness in the hands or feet Unusual bruising or bleeding Side effects that usually do not require medical attention (report to your care team if they continue or are bothersome): Hair loss Nausea Vomiting This list may not describe all possible side effects. Call your doctor for medical advice about side effects. You may report side effects to FDA at 1-800-FDA-1088. Where should I keep my medication? This medication is given in a hospital or clinic. It will not be stored at home. NOTE: This sheet is a summary. It may not cover all possible information. If you have questions about this medicine, talk to your doctor, pharmacist, or health care provider.  2024 [employer]vier/Gold Standard (2022-03-08 00:00:00)

## 2023-12-19 NOTE — Progress Notes (Signed)
[facility] Clinical Social Work  Initial Assessment   Kelly Thomas is a 68 y.o. year old female accompanied by husband. Clinical Social Work was referred by new patient protocol for assessment of psychosocial needs.   SDOH (Social Determinants of Health) assessments performed: Yes SDOH Interventions    Flowsheet Row Clinical Support from 12/19/2023 in [facility] - A Dept Of [place]. [facility] Office Visit from 11/21/2023 in [facility] - A Dept Of [place]. [facility]  SDOH Interventions    Food Insecurity Interventions Intervention Not Indicated Intervention Not Indicated  Housing Interventions Intervention Not Indicated Intervention Not Indicated  Transportation Interventions Intervention Not Indicated Intervention Not Indicated  Utilities Interventions -- Intervention Not Indicated       SDOH Screenings   Food Insecurity: No Food Insecurity (12/19/2023)  Housing: Low Risk  (12/19/2023)  Transportation Needs: No Transportation Needs (12/19/2023)  Utilities: Not At Risk (12/03/2023)  Depression (PHQ2-9): Low Risk  (12/03/2023)  Tobacco Use: Unknown (12/18/2023)     Distress Screen completed: No     No data to display            Family/Social Information:  Housing Arrangement: patient lives with husband. Daughter lives nearby Family members/support persons in your life? Family and Friends Transportation concerns: no  Employment: Retired.  Income source: Actor concerns:  Potentially medical costs - will see once bills start coming through Type of concern: Medical bills Food access concerns: no Religious or spiritual practice: Kelly Thomas is helpful to pt Advanced directives: Not known Services Currently in place:  [facility] Medicare  Coping/ Adjustment to diagnosis: Patient understands treatment plan and what happens next? yes, receiving first infusion today. Is a little nervous and has had down moments,  but overall able to manage. Discussed first treatment being difficult because of the unknown and then being empowered for next treatment with knowing what to expect Concerns about diagnosis and/or treatment:  not knowing how she will feel after treatments; continued bleeding Patient reported stressors: Adjusting to my illness Patient enjoys time with family/ friends Current coping skills/ strengths: Ability for insight , Capable of independent living , Communication skills , and Supportive family/friends     SUMMARY: Current SDOH Barriers:  No major barriers identified at this time  Clinical Social Work Clinical Goal(s):  No clinical social work goals at this time  Interventions: Discussed common feeling and emotions when being diagnosed with cancer, and the importance of support during treatment Informed patient of the support team roles and support services at [facility] Provided CSW contact information and encouraged patient to call with any questions or concerns   Follow Up Plan: Patient will contact CSW with any support or resource needs Patient verbalizes understanding of plan: Yes    Kelly Thomas Kelly Kelly Mackowiak, LCSW Clinical Social Worker [facility]

## 2023-12-19 NOTE — Progress Notes (Signed)
Per Dr Bertis Ruddy, ok to run post hydration fluids concurrent with therapy

## 2023-12-22 ENCOUNTER — Ambulatory Visit
Admission: RE | Admit: 2023-12-22 | Discharge: 2023-12-22 | Disposition: A | Discharge status: Home / Self Care | Payer: Medicare Other | Source: Ambulatory Visit | Attending: Radiation Oncology | Admitting: Radiation Oncology

## 2023-12-22 ENCOUNTER — Ambulatory Visit: Payer: Medicare Other | Admitting: Radiation Oncology

## 2023-12-22 ENCOUNTER — Other Ambulatory Visit: Payer: Self-pay

## 2023-12-22 DIAGNOSIS — R300 Dysuria: Secondary | ICD-10-CM | POA: Insufficient documentation

## 2023-12-22 DIAGNOSIS — C530 Malignant neoplasm of endocervix: Secondary | ICD-10-CM | POA: Diagnosis present

## 2023-12-22 DIAGNOSIS — Z510 Encounter for antineoplastic radiation therapy: Secondary | ICD-10-CM | POA: Diagnosis not present

## 2023-12-22 DIAGNOSIS — Z5111 Encounter for antineoplastic chemotherapy: Secondary | ICD-10-CM | POA: Insufficient documentation

## 2023-12-22 LAB — RAD ONC ARIA SESSION SUMMARY
Course Elapsed Days: 11
Plan Fractions Treated to Date: 3
Plan Prescribed Dose Per Fraction: 1.8 Gy
Plan Total Fractions Prescribed: 20
Plan Total Prescribed Dose: 36 Gy
Reference Point Dosage Given to Date: 5.4 Gy
Reference Point Session Dosage Given: 1.8 Gy
Session Number: 8

## 2023-12-23 ENCOUNTER — Other Ambulatory Visit: Payer: Self-pay

## 2023-12-23 ENCOUNTER — Ambulatory Visit
Admission: RE | Admit: 2023-12-23 | Discharge: 2023-12-23 | Disposition: A | Discharge status: Home / Self Care | Payer: Medicare Other | Source: Ambulatory Visit | Attending: Radiation Oncology | Admitting: Radiation Oncology

## 2023-12-23 DIAGNOSIS — R300 Dysuria: Secondary | ICD-10-CM

## 2023-12-23 DIAGNOSIS — Z5111 Encounter for antineoplastic chemotherapy: Secondary | ICD-10-CM | POA: Diagnosis not present

## 2023-12-23 DIAGNOSIS — Z510 Encounter for antineoplastic radiation therapy: Secondary | ICD-10-CM | POA: Diagnosis not present

## 2023-12-23 LAB — RAD ONC ARIA SESSION SUMMARY
Course Elapsed Days: 12
Plan Fractions Treated to Date: 4
Plan Prescribed Dose Per Fraction: 1.8 Gy
Plan Total Fractions Prescribed: 20
Plan Total Prescribed Dose: 36 Gy
Reference Point Dosage Given to Date: 7.2 Gy
Reference Point Session Dosage Given: 1.8 Gy
Session Number: 9

## 2023-12-23 LAB — URINALYSIS, COMPLETE (UACMP) WITH MICROSCOPIC: RBC / HPF: >50 RBC/hpf (ref 0–5)

## 2023-12-24 ENCOUNTER — Inpatient Hospital Stay: Payer: Medicare Other

## 2023-12-24 ENCOUNTER — Ambulatory Visit
Admission: RE | Admit: 2023-12-24 | Discharge: 2023-12-24 | Disposition: A | Discharge status: Home / Self Care | Payer: Medicare Other | Source: Ambulatory Visit | Attending: Radiation Oncology | Admitting: Radiation Oncology

## 2023-12-24 ENCOUNTER — Other Ambulatory Visit: Payer: Self-pay

## 2023-12-24 DIAGNOSIS — Z5111 Encounter for antineoplastic chemotherapy: Secondary | ICD-10-CM | POA: Insufficient documentation

## 2023-12-24 DIAGNOSIS — R197 Diarrhea, unspecified: Secondary | ICD-10-CM | POA: Insufficient documentation

## 2023-12-24 DIAGNOSIS — R110 Nausea: Secondary | ICD-10-CM | POA: Insufficient documentation

## 2023-12-24 DIAGNOSIS — C530 Malignant neoplasm of endocervix: Secondary | ICD-10-CM | POA: Insufficient documentation

## 2023-12-24 DIAGNOSIS — D61818 Other pancytopenia: Secondary | ICD-10-CM | POA: Insufficient documentation

## 2023-12-24 DIAGNOSIS — R300 Dysuria: Secondary | ICD-10-CM | POA: Diagnosis not present

## 2023-12-24 DIAGNOSIS — H9313 Tinnitus, bilateral: Secondary | ICD-10-CM | POA: Insufficient documentation

## 2023-12-24 DIAGNOSIS — L270 Generalized skin eruption due to drugs and medicaments taken internally: Secondary | ICD-10-CM | POA: Insufficient documentation

## 2023-12-24 DIAGNOSIS — Z510 Encounter for antineoplastic radiation therapy: Secondary | ICD-10-CM | POA: Diagnosis not present

## 2023-12-24 LAB — BASIC METABOLIC PANEL - CANCER CENTER ONLY
Anion gap: 6 (ref 5–15)
BUN: 10 mg/dL (ref 8–23)
CO2: 29 mmol/L (ref 22–32)
Calcium: 9.3 mg/dL (ref 8.9–10.3)
Chloride: 95 mmol/L — ABNORMAL LOW (ref 98–111)
Creatinine: 0.86 mg/dL (ref 0.44–1.00)
GFR, Estimated: >60 mL/min (ref >60)
Glucose, Bld: 100 mg/dL — ABNORMAL HIGH (ref 70–99)
Potassium: 4.4 mmol/L (ref 3.5–5.1)
Sodium: 130 mmol/L — ABNORMAL LOW (ref 135–145)

## 2023-12-24 LAB — CBC WITH DIFFERENTIAL (CANCER CENTER ONLY)
Abs Immature Granulocytes: 0.03 10*3/uL (ref 0.00–0.07)
Basophils Absolute: 0 10*3/uL (ref 0.0–0.1)
Basophils Relative: 0 %
Eosinophils Absolute: 0.3 10*3/uL (ref 0.0–0.5)
Eosinophils Relative: 5 %
HCT: 39 % (ref 36.0–46.0)
Hemoglobin: 13.2 g/dL (ref 12.0–15.0)
Immature Granulocytes: 1 %
Lymphocytes Relative: 7 %
Lymphs Abs: 0.4 10*3/uL — ABNORMAL LOW (ref 0.7–4.0)
MCH: 31.7 pg (ref 26.0–34.0)
MCHC: 33.8 g/dL (ref 30.0–36.0)
MCV: 93.5 fL (ref 80.0–100.0)
Monocytes Absolute: 0.4 10*3/uL (ref 0.1–1.0)
Monocytes Relative: 7 %
Neutro Abs: 4.7 10*3/uL (ref 1.7–7.7)
Neutrophils Relative %: 80 %
Platelet Count: 203 10*3/uL (ref 150–400)
RBC: 4.17 MIL/uL (ref 3.87–5.11)
RDW: 12.6 % (ref 11.5–15.5)
WBC Count: 5.8 10*3/uL (ref 4.0–10.5)
nRBC: 0 % (ref 0.0–0.2)

## 2023-12-24 LAB — RAD ONC ARIA SESSION SUMMARY
Course Elapsed Days: 13
Plan Fractions Treated to Date: 5
Plan Prescribed Dose Per Fraction: 1.8 Gy
Plan Total Fractions Prescribed: 20
Plan Total Prescribed Dose: 36 Gy
Reference Point Dosage Given to Date: 9 Gy
Reference Point Session Dosage Given: 1.8 Gy
Session Number: 10

## 2023-12-25 ENCOUNTER — Ambulatory Visit: Payer: Medicare Other

## 2023-12-25 ENCOUNTER — Other Ambulatory Visit: Payer: Self-pay | Admitting: Radiation Oncology

## 2023-12-25 ENCOUNTER — Ambulatory Visit
Admission: RE | Admit: 2023-12-25 | Discharge: 2023-12-25 | Disposition: A | Discharge status: Home / Self Care | Payer: Medicare Other | Source: Ambulatory Visit | Attending: Radiation Oncology | Admitting: Radiation Oncology

## 2023-12-25 ENCOUNTER — Encounter: Payer: Self-pay | Admitting: Hematology and Oncology

## 2023-12-25 ENCOUNTER — Other Ambulatory Visit: Payer: Self-pay

## 2023-12-25 ENCOUNTER — Inpatient Hospital Stay (HOSPITAL_BASED_OUTPATIENT_CLINIC_OR_DEPARTMENT_OTHER): Payer: Medicare Other | Admitting: Hematology and Oncology

## 2023-12-25 ENCOUNTER — Other Ambulatory Visit: Payer: Self-pay | Admitting: Hematology and Oncology

## 2023-12-25 VITALS — BP 118/70 | HR 79 | Temp 98.2°F | Resp 18 | Ht 60.0 in | Wt 117.8 lb

## 2023-12-25 DIAGNOSIS — Z510 Encounter for antineoplastic radiation therapy: Secondary | ICD-10-CM | POA: Diagnosis not present

## 2023-12-25 DIAGNOSIS — R300 Dysuria: Secondary | ICD-10-CM | POA: Diagnosis not present

## 2023-12-25 DIAGNOSIS — L270 Generalized skin eruption due to drugs and medicaments taken internally: Secondary | ICD-10-CM | POA: Insufficient documentation

## 2023-12-25 DIAGNOSIS — R634 Abnormal weight loss: Secondary | ICD-10-CM | POA: Diagnosis not present

## 2023-12-25 DIAGNOSIS — C530 Malignant neoplasm of endocervix: Secondary | ICD-10-CM | POA: Diagnosis not present

## 2023-12-25 DIAGNOSIS — Z5111 Encounter for antineoplastic chemotherapy: Secondary | ICD-10-CM | POA: Diagnosis not present

## 2023-12-25 LAB — RAD ONC ARIA SESSION SUMMARY
Course Elapsed Days: 14
Plan Fractions Treated to Date: 6
Plan Prescribed Dose Per Fraction: 1.8 Gy
Plan Total Fractions Prescribed: 20
Plan Total Prescribed Dose: 36 Gy
Reference Point Dosage Given to Date: 10.8 Gy
Reference Point Session Dosage Given: 1.8 Gy
Session Number: 11

## 2023-12-25 LAB — URINE CULTURE: Culture: 10000 — AB

## 2023-12-25 LAB — MAGNESIUM: Magnesium: 1.9 mg/dL (ref 1.7–2.4)

## 2023-12-25 MED ORDER — NITROFURANTOIN MONOHYD MACRO 100 MG PO CAPS: 100.0000 mg | ORAL_CAPSULE | Freq: Two times a day (BID) | ORAL | 0 refills | Status: DC

## 2023-12-25 MED ORDER — PREDNISONE 20 MG PO TABS: 20.0000 mg | ORAL_TABLET | Freq: Every day | ORAL | 1 refills | Status: DC

## 2023-12-25 NOTE — Progress Notes (Signed)
 [facility] OFFICE PROGRESS NOTE  Patient Care Team: Shona Norleen PEDLAR, MD as PCP - General (Internal Medicine)  ASSESSMENT & PLAN:  Cervical cancer [facility]) She developed significant skin rashes on both legs likely due to medication side effects Based on multiple agents involved, I suspect it could be due to Emend I will stop Emend with this cycle and see if that could improve her skin rash She did not experience much nausea and she has antiemetics to take as needed I will reassess next week but we will continue treatment as scheduled  Drug-induced skin rash Likely due to side effects of one of her antiemetics As above, we will discontinue Emend this cycle I will also prescribe prednisone  and I will reassess next week  Weight loss, non-intentional We discussed importance of frequent small meals and high-protein diet  No orders of the defined types were placed in this encounter.   All questions were answered. The patient knows to call the clinic with any problems, questions or concerns. The total time spent in the appointment was 40 minutes encounter with patients including review of chart and various tests results, discussions about plan of care and coordination of care plan   Almarie Bedford, MD 12/25/2023 12:29 PM  INTERVAL HISTORY: Please see below for problem oriented charting. she returns for chemo follow-up with her husband She developed skin rash approximately 3 days ago The skin rash is not itchy She denies mouth sores, nausea or changes in bowel habits She continues to have persistent vaginal bleeding, may be slightly improved She has lost some weight  REVIEW OF SYSTEMS:   Constitutional: Denies fevers, chills  Eyes: Denies blurriness of vision Ears, nose, mouth, throat, and face: Denies mucositis or sore throat Respiratory: Denies cough, dyspnea or wheezes Cardiovascular: Denies palpitation, chest discomfort or lower extremity swelling Gastrointestinal:  Denies  nausea, heartburn or change in bowel habits Lymphatics: Denies new lymphadenopathy or easy bruising Neurological:Denies numbness, tingling or new weaknesses Behavioral/Psych: Mood is stable, no new changes  All other systems were reviewed with the patient and are negative.  I have reviewed the past medical history, past surgical history, social history and family history with the patient and they are unchanged from previous note.  ALLERGIES:  has no known allergies.  MEDICATIONS:  Current Outpatient Medications  Medication Sig Dispense Refill   predniSONE  (DELTASONE ) 20 MG tablet Take 1 tablet (20 mg total) by mouth daily with breakfast. 30 tablet 1   atorvastatin (LIPITOR) 10 MG tablet Take 10 mg by mouth 3 (three) times a week.     calcium-vitamin D (OSCAL WITH D) 500-200 MG-UNIT per tablet Take 1 tablet by mouth daily.     lidocaine -prilocaine  (EMLA ) cream Apply to affected area once (Patient not taking: Reported on 12/03/2023) 30 g 3   Multiple Vitamins-Minerals (MULTIVITAMIN WOMEN PO) Take 1 tablet by mouth daily.     ondansetron  (ZOFRAN ) 8 MG tablet Take 1 tablet (8 mg total) by mouth every 8 (eight) hours as needed for nausea or vomiting. Start on the third day after cisplatin . (Patient not taking: Reported on 12/03/2023) 30 tablet 1   oxyCODONE -acetaminophen  (PERCOCET/ROXICET) 5-325 MG tablet Take 1 tablet by mouth every 6 (six) hours as needed for severe pain (pain score 7-10). 20 tablet 0   phenazopyridine  (PYRIDIUM ) 200 MG tablet Take 1 tablet (200 mg total) by mouth 3 (three) times daily as needed for pain. 20 tablet 1   prochlorperazine  (COMPAZINE ) 10 MG tablet Take 1 tablet (10  mg total) by mouth every 6 (six) hours as needed (Nausea or vomiting). (Patient not taking: Reported on 12/03/2023) 30 tablet 1   No current facility-administered medications for this visit.    SUMMARY OF ONCOLOGIC HISTORY: Oncology History  Cervical cancer (HCC)  09/19/2023 Initial Diagnosis   Patient  developed discharge in 08/2023 with postmenopausal bleeding starting in 09/2023.    11/07/2023 Initial Biopsy   Report available at [facility] Cervical mass biopsy: Poorly differentiated invasive squamous cell carcinoma   11/07/2023 Imaging   Office ultrasound shows a uterus measuring 6.4 x 4 x 2.1 cm with an endometrial lining measuring 0.9 mm with small endometrial fluid.  Normal bilateral adnexa.  Solid, inhomogenous lesion within the cervix measuring 3.8 x 3.2 x 3.8 cm.   11/20/2023 Initial Diagnosis   Cervical cancer (HCC)   12/01/2023 Cancer Staging   Staging form: Cervix Uteri, AJCC Version 9 - Clinical stage from 12/01/2023: FIGO Stage IIB (cT2b, cN0, cM0) - Signed by Lonn Hicks, MD on 12/01/2023 Stage prefix: Initial diagnosis   12/01/2023 PET scan   NM PET Image Initial (PI) Skull Base To Thigh (F-18 FDG) Result Date: 12/01/2023 CLINICAL DATA:  Initial treatment strategy for cervical cancer. EXAM: NUCLEAR MEDICINE PET SKULL BASE TO THIGH TECHNIQUE: 6.8 mCi F-18 FDG was injected intravenously. Full-ring PET imaging was performed from the skull base to thigh after the radiotracer. CT data was obtained and used for attenuation correction and anatomic localization. Fasting blood glucose: 120 mg/dl COMPARISON:  None Available. FINDINGS: Mediastinal blood pool activity: SUV max 2.5 Liver activity: SUV max NA NECK: No abnormal hypermetabolism. Incidental CT findings: None. CHEST: No abnormal hypermetabolism. Incidental CT findings: Atherosclerotic calcification of the aorta and left anterior descending coronary artery. Heart is at the upper limits of normal in size to mildly enlarged. No pericardial or pleural effusion. ABDOMEN/PELVIS: A mass involving the cervix, and likely lower uterine segment, measures approximately 3.8 x 4.6 cm (4/156), SUV max 25.9. No additional abnormal hypermetabolism. Incidental CT findings: Cholecystectomy. Kidneys appear non rotated, an anatomical variant. Atherosclerotic  calcification of the aorta. SKELETON: No abnormal hypermetabolism. Incidental CT findings: Minimal degenerative change in the spine. IMPRESSION: 1. Hypermetabolic cervical mass, compatible with the provided history of cervical cancer. No evidence of metastatic disease. 2. Aortic atherosclerosis (ICD10-I70.0). Coronary artery calcification. Electronically Signed   By: Newell Eke M.D.   On: 12/01/2023 16:15      12/05/2023 Imaging   1. Right lateral cervical mass is identified measuring 4.9 x 4.0 x 4.0 cm. Positive for parametrial invasion. Tumor extension into the upper 2/3 of the vagina is visualized. There is tumor extension up to the rectum with loss of fat plane between the anterior wall of the rectum and the mass. Anterior extension of the tumor extends up to the posterior wall of the bladder with focal area of loss of fat plane seen. Cannot exclude bladder involvement. 2. No signs of hydroureter. 3. No signs of pelvic lymphadenopathy or peritoneal nodularity. 4. Left fundal submucosal fibroid shows diffuse enhancement measuring 2.2 x 2.2 cm   12/11/2023 Procedure   Successful placement of a right IJ approach Power Port with ultrasound and fluoroscopic guidance. The catheter is ready for use.   12/19/2023 -  Chemotherapy   Patient is on Treatment Plan : cervical cancer Cisplatin  (40) q7d       PHYSICAL EXAMINATION: ECOG PERFORMANCE STATUS: 1 - Symptomatic but completely ambulatory  Vitals:   12/25/23 0829  BP: 118/70  Pulse: 79  Resp:  18  Temp: 98.2 F (36.8 C)  SpO2: 100%   Filed Weights   12/25/23 0829  Weight: 117 lb 12.8 oz (53.4 kg)    GENERAL:alert, no distress and comfortable SKIN: She has diffuse bilateral skin rashes on both legs but none elsewhere EYES: normal, Conjunctiva are pink and non-injected, sclera clear OROPHARYNX:no exudate, no erythema and lips, buccal mucosa, and tongue normal  NECK: supple, thyroid normal size, non-tender, without  nodularity LYMPH:  no palpable lymphadenopathy in the cervical, axillary or inguinal LUNGS: clear to auscultation and percussion with normal breathing effort HEART: regular rate & rhythm and no murmurs and no lower extremity edema ABDOMEN:abdomen soft, non-tender and normal bowel sounds Musculoskeletal:no cyanosis of digits and no clubbing  NEURO: alert & oriented x 3 with fluent speech, no focal motor/sensory deficits  LABORATORY DATA:  I have reviewed the data as listed    Component Value Date/Time   NA 130 (L) 12/24/2023 0928   K 4.4 12/24/2023 0928   CL 95 (L) 12/24/2023 0928   CO2 29 12/24/2023 0928   GLUCOSE 100 (H) 12/24/2023 0928   BUN 10 12/24/2023 0928   CREATININE 0.86 12/24/2023 0928   CALCIUM 9.3 12/24/2023 0928   GFRNONAA >60 12/24/2023 0928   GFRAA 73 (L) 11/09/2014 0719    No results found for: SPEP, UPEP  Lab Results  Component Value Date   WBC 5.8 12/24/2023   NEUTROABS 4.7 12/24/2023   HGB 13.2 12/24/2023   HCT 39.0 12/24/2023   MCV 93.5 12/24/2023   PLT 203 12/24/2023      Chemistry      Component Value Date/Time   NA 130 (L) 12/24/2023 0928   K 4.4 12/24/2023 0928   CL 95 (L) 12/24/2023 0928   CO2 29 12/24/2023 0928   BUN 10 12/24/2023 0928   CREATININE 0.86 12/24/2023 0928      Component Value Date/Time   CALCIUM 9.3 12/24/2023 0928

## 2023-12-25 NOTE — Assessment & Plan Note (Signed)
 We discussed importance of frequent small meals and high-protein diet

## 2023-12-25 NOTE — Assessment & Plan Note (Signed)
 Likely due to side effects of one of her antiemetics As above, we will discontinue Emend this cycle I will also prescribe prednisone  and I will reassess next week

## 2023-12-25 NOTE — Assessment & Plan Note (Signed)
 She developed significant skin rashes on both legs likely due to medication side effects Based on multiple agents involved, I suspect it could be due to Emend I will stop Emend with this cycle and see if that could improve her skin rash She did not experience much nausea and she has antiemetics to take as needed I will reassess next week but we will continue treatment as scheduled

## 2023-12-26 ENCOUNTER — Ambulatory Visit: Payer: Medicare Other

## 2023-12-26 ENCOUNTER — Other Ambulatory Visit: Payer: Self-pay

## 2023-12-26 ENCOUNTER — Ambulatory Visit
Admission: RE | Admit: 2023-12-26 | Discharge: 2023-12-26 | Disposition: A | Discharge status: Home / Self Care | Payer: Medicare Other | Source: Ambulatory Visit | Attending: Radiation Oncology | Admitting: Radiation Oncology

## 2023-12-26 ENCOUNTER — Telehealth: Payer: Self-pay | Admitting: Oncology

## 2023-12-26 ENCOUNTER — Inpatient Hospital Stay: Payer: Medicare Other

## 2023-12-26 VITALS — BP 115/63 | HR 83 | Temp 98.3°F | Resp 16

## 2023-12-26 DIAGNOSIS — Z5111 Encounter for antineoplastic chemotherapy: Secondary | ICD-10-CM | POA: Diagnosis not present

## 2023-12-26 DIAGNOSIS — R300 Dysuria: Secondary | ICD-10-CM | POA: Diagnosis not present

## 2023-12-26 DIAGNOSIS — Z510 Encounter for antineoplastic radiation therapy: Secondary | ICD-10-CM | POA: Diagnosis not present

## 2023-12-26 DIAGNOSIS — C530 Malignant neoplasm of endocervix: Secondary | ICD-10-CM

## 2023-12-26 LAB — RAD ONC ARIA SESSION SUMMARY
Course Elapsed Days: 15
Plan Fractions Treated to Date: 7
Plan Prescribed Dose Per Fraction: 1.8 Gy
Plan Total Fractions Prescribed: 20
Plan Total Prescribed Dose: 36 Gy
Reference Point Dosage Given to Date: 12.6 Gy
Reference Point Session Dosage Given: 1.8 Gy
Session Number: 12

## 2023-12-26 MED ORDER — SODIUM CHLORIDE 0.9% FLUSH
10.0000 mL | INTRAVENOUS | Status: DC | PRN
  Administered 2023-12-26: 10 mL

## 2023-12-26 MED ORDER — POTASSIUM CHLORIDE IN NACL 20-0.9 MEQ/L-% IV SOLN: Freq: Once | INTRAVENOUS | Status: AC

## 2023-12-26 MED ORDER — HEPARIN SOD (PORK) LOCK FLUSH 100 UNIT/ML IV SOLN
500.0000 [IU] | Freq: Once | INTRAVENOUS | Status: AC | PRN
  Administered 2023-12-26: 500 [IU]

## 2023-12-26 MED ORDER — FAMOTIDINE 20 MG PO TABS
20.0000 mg | ORAL_TABLET | Freq: Once | ORAL | Status: AC
Start: 2023-12-26 — End: 2023-12-26
  Administered 2023-12-26: 20 mg via ORAL
  Filled 2023-12-26: qty 1

## 2023-12-26 MED ORDER — PALONOSETRON HCL INJECTION 0.25 MG/5ML
0.2500 mg | Freq: Once | INTRAVENOUS | Status: AC
  Administered 2023-12-26: 0.25 mg via INTRAVENOUS
  Filled 2023-12-26: qty 5

## 2023-12-26 MED ORDER — SODIUM CHLORIDE 0.9 % IV SOLN
40.0000 mg/m2 | Freq: Once | INTRAVENOUS | Status: AC
  Administered 2023-12-26: 61 mg via INTRAVENOUS
  Filled 2023-12-26: qty 61

## 2023-12-26 MED ORDER — DEXAMETHASONE SODIUM PHOSPHATE 10 MG/ML IJ SOLN
10.0000 mg | Freq: Once | INTRAMUSCULAR | Status: AC
  Administered 2023-12-26: 10 mg via INTRAVENOUS
  Filled 2023-12-26: qty 1

## 2023-12-26 MED ORDER — MAGNESIUM SULFATE 2 GM/50ML IV SOLN
2.0000 g | Freq: Once | INTRAVENOUS | Status: AC
  Administered 2023-12-26: 2 g via INTRAVENOUS
  Filled 2023-12-26: qty 50

## 2023-12-26 MED ORDER — SODIUM CHLORIDE 0.9 % IV SOLN: INTRAVENOUS | Status: DC

## 2023-12-26 NOTE — Telephone Encounter (Signed)
 Called Angie at [facility] regarding PD-L1 and LVI.  She does have the LVI report and will fax it today.  She is having trouble getting the PD-L1 report and will call again today to try to get the results.

## 2023-12-26 NOTE — Patient Instructions (Signed)
 [facility]  Discharge Instructions: Thank you for choosing [facility] to provide your oncology and hematology care.   If you have a lab appointment with the Cancer Center, please go directly to the Cancer Center and check in at the registration area.   Wear comfortable clothing and clothing appropriate for easy access to any Portacath or PICC line.   We strive to give you quality time with your provider. You may need to reschedule your appointment if you arrive late (15 or more minutes).  Arriving late affects you and other patients whose appointments are after yours.  Also, if you miss three or more appointments without notifying the office, you may be dismissed from the clinic at the provider's discretion.      For prescription refill requests, have your pharmacy contact our office and allow 72 hours for refills to be completed.    Today you received the following chemotherapy and/or immunotherapy agents: Cisplatin      To help prevent nausea and vomiting after your treatment, we encourage you to take your nausea medication as directed.  BELOW ARE SYMPTOMS THAT SHOULD BE REPORTED IMMEDIATELY: *FEVER GREATER THAN 100.4 F (38 C) OR HIGHER *CHILLS OR SWEATING *NAUSEA AND VOMITING THAT IS NOT CONTROLLED WITH YOUR NAUSEA MEDICATION *UNUSUAL SHORTNESS OF BREATH *UNUSUAL BRUISING OR BLEEDING *URINARY PROBLEMS (pain or burning when urinating, or frequent urination) *BOWEL PROBLEMS (unusual diarrhea, constipation, pain near the anus) TENDERNESS IN MOUTH AND THROAT WITH OR WITHOUT PRESENCE OF ULCERS (sore throat, sores in mouth, or a toothache) UNUSUAL RASH, SWELLING OR PAIN  UNUSUAL VAGINAL DISCHARGE OR ITCHING   Items with * indicate a potential emergency and should be followed up as soon as possible or go to the Emergency Department if any problems should occur.  Please show the CHEMOTHERAPY ALERT CARD or IMMUNOTHERAPY  ALERT CARD at check-in to the Emergency Department and triage nurse.  Should you have questions after your visit or need to cancel or reschedule your appointment, please contact [facility] A [facility]  Dept: [phone]  and follow the prompts.  Office hours are 8:00 a.m. to 4:30 p.m. Monday - Friday. Please note that voicemails left after 4:00 p.m. may not be returned until the following business day.  We are closed weekends and major holidays. You have access to a nurse at all times for urgent questions. Please call the main number to the clinic Dept: [phone] and follow the prompts.   For any non-urgent questions, you may also contact your provider using MyChart. We now offer e-Visits for anyone 65 and older to request care online for non-urgent symptoms. For details visit [email or web address].   Also download the MyChart app! Go to the app store, search "MyChart", open the app, select [place], and log in with your MyChart username and password.  Cisplatin Injection What is this medication? CISPLATIN (SIS pla tin) treats some types of cancer. It works by slowing down the growth of cancer cells. This medicine may be used for other purposes; ask your health care provider or pharmacist if you have questions. COMMON BRAND NAME(S): Platinol, Platinol -AQ What should I tell my care team before I take this medication? They need to know if you have any of these conditions: Eye disease, vision problems Hearing problems Kidney disease Low blood counts, such as low white cells, platelets, or red  blood cells Tingling of the fingers or toes, or other nerve disorder An unusual or allergic reaction to cisplatin, carboplatin, oxaliplatin, other medications, foods, dyes, or preservatives If you or your partner are pregnant or trying to get pregnant Breast-feeding How should I use this medication? This medication is injected into a vein. It is given  by your care team in a hospital or clinic setting. Talk to your care team about the use of this medication in children. Special care may be needed. Overdosage: If you think you have taken too much of this medicine contact a poison control center or emergency room at once. NOTE: This medicine is only for you. Do not share this medicine with others. What if I miss a dose? Keep appointments for follow-up doses. It is important not to miss your dose. Call your care team if you are unable to keep an appointment. What may interact with this medication? Do not take this medication with any of the following: Live virus vaccines This medication may also interact with the following: Certain antibiotics, such as amikacin, gentamicin, neomycin, polymyxin B, streptomycin, tobramycin, vancomycin Foscarnet This list may not describe all possible interactions. Give your health care provider a list of all the medicines, herbs, non-prescription drugs, or dietary supplements you use. Also tell them if you smoke, drink alcohol, or use illegal drugs. Some items may interact with your medicine. What should I watch for while using this medication? Your condition will be monitored carefully while you are receiving this medication. You may need blood work done while taking this medication. This medication may make you feel generally unwell. This is not uncommon, as chemotherapy can affect healthy cells as well as cancer cells. Report any side effects. Continue your course of treatment even though you feel ill unless your care team tells you to stop. This medication may increase your risk of getting an infection. Call your care team for advice if you get a fever, chills, sore throat, or other symptoms of a cold or flu. Do not treat yourself. Try to avoid being around people who are sick. Avoid taking medications that contain aspirin, acetaminophen, ibuprofen, naproxen, or ketoprofen unless instructed by your care team. These  medications may hide a fever. This medication may increase your risk to bruise or bleed. Call your care team if you notice any unusual bleeding. Be careful brushing or flossing your teeth or using a toothpick because you may get an infection or bleed more easily. If you have any dental work done, tell your dentist you are receiving this medication. Drink fluids as directed while you are taking this medication. This will help protect your kidneys. Call your care team if you get diarrhea. Do not treat yourself. Talk to your care team if you or your partner wish to become pregnant or think you might be pregnant. This medication can cause serious birth defects if taken during pregnancy and for 14 months after the last dose. A negative pregnancy test is required before starting this medication. A reliable form of contraception is recommended while taking this medication and for 14 months after the last dose. Talk to your care team about effective forms of contraception. Do not father a child while taking this medication and for 11 months after the last dose. Use a condom during sex during this time period. Do not breast-feed while taking this medication. This medication may cause infertility. Talk to your care team if you are concerned about your fertility. What side effects may I  notice from receiving this medication? Side effects that you should report to your care team as soon as possible: Allergic reactions--skin rash, itching, hives, swelling of the face, lips, tongue, or throat Eye pain, change in vision, vision loss Hearing loss, ringing in ears Infection--fever, chills, cough, sore throat, wounds that don't heal, pain or trouble when passing urine, general feeling of discomfort or being unwell Kidney injury--decrease in the amount of urine, swelling of the ankles, hands, or feet Low red blood cell level--unusual weakness or fatigue, dizziness, headache, trouble breathing Painful swelling, warmth,  or redness of the skin, blisters or sores at the infusion site Pain, tingling, or numbness in the hands or feet Unusual bruising or bleeding Side effects that usually do not require medical attention (report to your care team if they continue or are bothersome): Hair loss Nausea Vomiting This list may not describe all possible side effects. Call your doctor for medical advice about side effects. You may report side effects to FDA at 1-800-FDA-1088. Where should I keep my medication? This medication is given in a hospital or clinic. It will not be stored at home. NOTE: This sheet is a summary. It may not cover all possible information. If you have questions about this medicine, talk to your doctor, pharmacist, or health care provider.  2024 Elsevier/Gold Standard (2022-03-08 00:00:00)

## 2023-12-29 ENCOUNTER — Other Ambulatory Visit: Payer: Self-pay

## 2023-12-29 ENCOUNTER — Ambulatory Visit
Admission: RE | Admit: 2023-12-29 | Discharge: 2023-12-29 | Disposition: A | Discharge status: Home / Self Care | Payer: Medicare Other | Source: Ambulatory Visit | Attending: Radiation Oncology | Admitting: Radiation Oncology

## 2023-12-29 ENCOUNTER — Ambulatory Visit: Payer: Medicare Other

## 2023-12-29 DIAGNOSIS — Z5111 Encounter for antineoplastic chemotherapy: Secondary | ICD-10-CM | POA: Diagnosis not present

## 2023-12-29 DIAGNOSIS — R300 Dysuria: Secondary | ICD-10-CM | POA: Diagnosis not present

## 2023-12-29 DIAGNOSIS — Z510 Encounter for antineoplastic radiation therapy: Secondary | ICD-10-CM | POA: Diagnosis not present

## 2023-12-29 LAB — RAD ONC ARIA SESSION SUMMARY
Course Elapsed Days: 18
Plan Fractions Treated to Date: 8
Plan Prescribed Dose Per Fraction: 1.8 Gy
Plan Total Fractions Prescribed: 20
Plan Total Prescribed Dose: 36 Gy
Reference Point Dosage Given to Date: 14.4 Gy
Reference Point Session Dosage Given: 1.8 Gy
Session Number: 13

## 2023-12-30 ENCOUNTER — Other Ambulatory Visit: Payer: Self-pay

## 2023-12-30 ENCOUNTER — Ambulatory Visit
Admission: RE | Admit: 2023-12-30 | Discharge: 2023-12-30 | Disposition: A | Discharge status: Home / Self Care | Payer: Medicare Other | Source: Ambulatory Visit | Attending: Radiation Oncology | Admitting: Radiation Oncology

## 2023-12-30 ENCOUNTER — Ambulatory Visit: Payer: Medicare Other

## 2023-12-30 ENCOUNTER — Other Ambulatory Visit: Payer: Self-pay | Admitting: Radiation Oncology

## 2023-12-30 DIAGNOSIS — Z5111 Encounter for antineoplastic chemotherapy: Secondary | ICD-10-CM | POA: Diagnosis not present

## 2023-12-30 DIAGNOSIS — R300 Dysuria: Secondary | ICD-10-CM | POA: Diagnosis not present

## 2023-12-30 DIAGNOSIS — Z510 Encounter for antineoplastic radiation therapy: Secondary | ICD-10-CM | POA: Diagnosis not present

## 2023-12-30 LAB — RAD ONC ARIA SESSION SUMMARY
Course Elapsed Days: 19
Plan Fractions Treated to Date: 9
Plan Prescribed Dose Per Fraction: 1.8 Gy
Plan Total Fractions Prescribed: 20
Plan Total Prescribed Dose: 36 Gy
Reference Point Dosage Given to Date: 16.2 Gy
Reference Point Session Dosage Given: 1.8 Gy
Session Number: 14

## 2023-12-30 MED ORDER — OXYCODONE-ACETAMINOPHEN 5-325 MG PO TABS: 1.0000 | ORAL_TABLET | Freq: Four times a day (QID) | ORAL | 0 refills | Status: DC | PRN

## 2023-12-31 ENCOUNTER — Ambulatory Visit: Payer: Medicare Other

## 2023-12-31 ENCOUNTER — Inpatient Hospital Stay: Payer: Medicare Other

## 2023-12-31 ENCOUNTER — Ambulatory Visit
Admission: RE | Admit: 2023-12-31 | Discharge: 2023-12-31 | Disposition: A | Discharge status: Home / Self Care | Payer: Medicare Other | Source: Ambulatory Visit | Attending: Radiation Oncology | Admitting: Radiation Oncology

## 2023-12-31 ENCOUNTER — Other Ambulatory Visit: Payer: Self-pay

## 2023-12-31 DIAGNOSIS — R300 Dysuria: Secondary | ICD-10-CM | POA: Diagnosis not present

## 2023-12-31 DIAGNOSIS — Z510 Encounter for antineoplastic radiation therapy: Secondary | ICD-10-CM | POA: Diagnosis not present

## 2023-12-31 DIAGNOSIS — Z5111 Encounter for antineoplastic chemotherapy: Secondary | ICD-10-CM | POA: Diagnosis not present

## 2023-12-31 DIAGNOSIS — C530 Malignant neoplasm of endocervix: Secondary | ICD-10-CM

## 2023-12-31 LAB — BASIC METABOLIC PANEL - CANCER CENTER ONLY
Anion gap: 5 (ref 5–15)
BUN: 17 mg/dL (ref 8–23)
CO2: 31 mmol/L (ref 22–32)
Calcium: 9.1 mg/dL (ref 8.9–10.3)
Chloride: 99 mmol/L (ref 98–111)
Creatinine: 0.87 mg/dL (ref 0.44–1.00)
GFR, Estimated: >60 mL/min (ref >60)
Glucose, Bld: 100 mg/dL — ABNORMAL HIGH (ref 70–99)
Potassium: 3.7 mmol/L (ref 3.5–5.1)
Sodium: 135 mmol/L (ref 135–145)

## 2023-12-31 LAB — CBC WITH DIFFERENTIAL (CANCER CENTER ONLY)
Abs Immature Granulocytes: 0.04 10*3/uL (ref 0.00–0.07)
Basophils Absolute: 0.1 10*3/uL (ref 0.0–0.1)
Basophils Relative: 1 %
Eosinophils Absolute: 0.5 10*3/uL (ref 0.0–0.5)
Eosinophils Relative: 7 %
HCT: 38.5 % (ref 36.0–46.0)
Hemoglobin: 13 g/dL (ref 12.0–15.0)
Immature Granulocytes: 1 %
Lymphocytes Relative: 5 %
Lymphs Abs: 0.3 10*3/uL — ABNORMAL LOW (ref 0.7–4.0)
MCH: 31.9 pg (ref 26.0–34.0)
MCHC: 33.8 g/dL (ref 30.0–36.0)
MCV: 94.6 fL (ref 80.0–100.0)
Monocytes Absolute: 0.4 10*3/uL (ref 0.1–1.0)
Monocytes Relative: 6 %
Neutro Abs: 5.2 10*3/uL (ref 1.7–7.7)
Neutrophils Relative %: 80 %
Platelet Count: 180 10*3/uL (ref 150–400)
RBC: 4.07 MIL/uL (ref 3.87–5.11)
RDW: 13.1 % (ref 11.5–15.5)
WBC Count: 6.5 10*3/uL (ref 4.0–10.5)
nRBC: 0 % (ref 0.0–0.2)

## 2023-12-31 LAB — RAD ONC ARIA SESSION SUMMARY
Course Elapsed Days: 20
Plan Fractions Treated to Date: 10
Plan Prescribed Dose Per Fraction: 1.8 Gy
Plan Total Fractions Prescribed: 20
Plan Total Prescribed Dose: 36 Gy
Reference Point Dosage Given to Date: 18 Gy
Reference Point Session Dosage Given: 1.8 Gy
Session Number: 15

## 2023-12-31 LAB — MAGNESIUM: Magnesium: 1.8 mg/dL (ref 1.7–2.4)

## 2024-01-01 ENCOUNTER — Ambulatory Visit
Admission: RE | Admit: 2024-01-01 | Discharge: 2024-01-01 | Disposition: A | Discharge status: Home / Self Care | Payer: Medicare Other | Source: Ambulatory Visit | Attending: Radiation Oncology | Admitting: Radiation Oncology

## 2024-01-01 ENCOUNTER — Other Ambulatory Visit: Payer: Self-pay

## 2024-01-01 ENCOUNTER — Encounter: Payer: Self-pay | Admitting: Hematology and Oncology

## 2024-01-01 ENCOUNTER — Inpatient Hospital Stay: Payer: Medicare Other | Admitting: Hematology and Oncology

## 2024-01-01 ENCOUNTER — Ambulatory Visit: Payer: Medicare Other

## 2024-01-01 VITALS — BP 124/69 | HR 89 | Temp 97.7°F | Resp 18 | Ht 60.0 in | Wt 116.8 lb

## 2024-01-01 DIAGNOSIS — Z510 Encounter for antineoplastic radiation therapy: Secondary | ICD-10-CM | POA: Diagnosis not present

## 2024-01-01 DIAGNOSIS — C530 Malignant neoplasm of endocervix: Secondary | ICD-10-CM | POA: Diagnosis not present

## 2024-01-01 DIAGNOSIS — R300 Dysuria: Secondary | ICD-10-CM | POA: Diagnosis not present

## 2024-01-01 DIAGNOSIS — L270 Generalized skin eruption due to drugs and medicaments taken internally: Secondary | ICD-10-CM | POA: Diagnosis not present

## 2024-01-01 DIAGNOSIS — Z5111 Encounter for antineoplastic chemotherapy: Secondary | ICD-10-CM | POA: Diagnosis not present

## 2024-01-01 LAB — RAD ONC ARIA SESSION SUMMARY
Course Elapsed Days: 21
Plan Fractions Treated to Date: 11
Plan Prescribed Dose Per Fraction: 1.8 Gy
Plan Total Fractions Prescribed: 20
Plan Total Prescribed Dose: 36 Gy
Reference Point Dosage Given to Date: 19.8 Gy
Reference Point Session Dosage Given: 1.8 Gy
Session Number: 16

## 2024-01-01 NOTE — Assessment & Plan Note (Signed)
This has almost resolved We would discontinue prednisone

## 2024-01-01 NOTE — Assessment & Plan Note (Signed)
She tolerated treatment well She has no further reoccurrence of rash I suspect Emend is responsible for the rash and I will remove it from her medication list indefinitely She denies nausea She will proceed with treatment as scheduled

## 2024-01-01 NOTE — Progress Notes (Signed)
[facility] OFFICE PROGRESS NOTE  Patient Care Team: Benita Stabile, MD as PCP - General (Internal Medicine)  ASSESSMENT & PLAN:  Cervical cancer [facility]) She tolerated treatment well She has no further reoccurrence of rash I suspect Emend is responsible for the rash and I will remove it from her medication list indefinitely She denies nausea She will proceed with treatment as scheduled  Drug-induced skin rash This has almost resolved We would discontinue prednisone  No orders of the defined types were placed in this encounter.   All questions were answered. The patient knows to call the clinic with any problems, questions or concerns. The total time spent in the appointment was 20 minutes encounter with patients including review of chart and various tests results, discussions about plan of care and coordination of care plan   Artis Delay, MD 01/01/2024 12:47 PM  INTERVAL HISTORY: Please see below for problem oriented charting. she returns for chemo follow-up She tolerated recent treatment well Denies nausea Her rash has resolved She was recently prescribed antibiotics for UTI  REVIEW OF SYSTEMS:   Constitutional: Denies fevers, chills or abnormal weight loss Eyes: Denies blurriness of vision Ears, nose, mouth, throat, and face: Denies mucositis or sore throat Respiratory: Denies cough, dyspnea or wheezes Cardiovascular: Denies palpitation, chest discomfort or lower extremity swelling Gastrointestinal:  Denies nausea, heartburn or change in bowel habits Skin: Denies abnormal skin rashes Lymphatics: Denies new lymphadenopathy or easy bruising Neurological:Denies numbness, tingling or new weaknesses Behavioral/Psych: Mood is stable, no new changes  All other systems were reviewed with the patient and are negative.  I have reviewed the past medical history, past surgical history, social history and family history with the patient and they are unchanged from  previous note.  ALLERGIES:  is allergic to [facility] dimeglumine].  MEDICATIONS:  Current Outpatient Medications  Medication Sig Dispense Refill   atorvastatin (LIPITOR) 10 MG tablet Take 10 mg by mouth 3 (three) times a week.     calcium-vitamin D (OSCAL WITH D) 500-200 MG-UNIT per tablet Take 1 tablet by mouth daily.     lidocaine-prilocaine (EMLA) cream Apply to affected area once (Patient not taking: Reported on 12/03/2023) 30 g 3   Multiple Vitamins-Minerals (MULTIVITAMIN WOMEN PO) Take 1 tablet by mouth daily.     nitrofurantoin, macrocrystal-monohydrate, (MACROBID) 100 MG capsule Take 1 capsule (100 mg total) by mouth 2 (two) times daily. 14 capsule 0   ondansetron (ZOFRAN) 8 MG tablet Take 1 tablet (8 mg total) by mouth every 8 (eight) hours as needed for nausea or vomiting. Start on the third day after cisplatin. (Patient not taking: Reported on 12/03/2023) 30 tablet 1   oxyCODONE-acetaminophen (PERCOCET/ROXICET) 5-325 MG tablet Take 1 tablet by mouth every 6 (six) hours as needed for severe pain (pain score 7-10). 20 tablet 0   phenazopyridine (PYRIDIUM) 200 MG tablet Take 1 tablet (200 mg total) by mouth 3 (three) times daily as needed for pain. 20 tablet 1   prochlorperazine (COMPAZINE) 10 MG tablet Take 1 tablet (10 mg total) by mouth every 6 (six) hours as needed (Nausea or vomiting). (Patient not taking: Reported on 12/03/2023) 30 tablet 1   No current facility-administered medications for this visit.    SUMMARY OF ONCOLOGIC HISTORY: Oncology History  Cervical cancer (HCC)  09/19/2023 Initial Diagnosis   Patient developed discharge in 08/2023 with postmenopausal bleeding starting in 09/2023.    11/07/2023 Initial Biopsy   Report available at [facility] Cervical mass biopsy: Poorly differentiated  invasive squamous cell carcinoma   11/07/2023 Imaging   Office ultrasound shows a uterus measuring 6.4 x 4 x 2.1 cm with an endometrial lining measuring 0.9 mm with small  endometrial fluid.  Normal bilateral adnexa.  Solid, inhomogenous lesion within the cervix measuring 3.8 x 3.2 x 3.8 cm.   11/20/2023 Initial Diagnosis   Cervical cancer (HCC)   12/01/2023 Cancer Staging   Staging form: Cervix Uteri, AJCC Version 9 - Clinical stage from 12/01/2023: FIGO Stage IIB (cT2b, cN0, cM0) - Signed by Artis Delay, MD on 12/01/2023 Stage prefix: Initial diagnosis   12/01/2023 PET scan   NM PET Image Initial (PI) Skull Base To Thigh (F-18 FDG) Result Date: 12/01/2023 CLINICAL DATA:  Initial treatment strategy for cervical cancer. EXAM: NUCLEAR MEDICINE PET SKULL BASE TO THIGH TECHNIQUE: 6.8 mCi F-18 FDG was injected intravenously. Full-ring PET imaging was performed from the skull base to thigh after the radiotracer. CT data was obtained and used for attenuation correction and anatomic localization. Fasting blood glucose: 120 mg/dl COMPARISON:  None Available. FINDINGS: Mediastinal blood pool activity: SUV max 2.5 Liver activity: SUV max NA NECK: No abnormal hypermetabolism. Incidental CT findings: None. CHEST: No abnormal hypermetabolism. Incidental CT findings: Atherosclerotic calcification of the aorta and left anterior descending coronary artery. Heart is at the upper limits of normal in size to mildly enlarged. No pericardial or pleural effusion. ABDOMEN/PELVIS: A mass involving the cervix, and likely lower uterine segment, measures approximately 3.8 x 4.6 cm (4/156), SUV max 25.9. No additional abnormal hypermetabolism. Incidental CT findings: Cholecystectomy. Kidneys appear non rotated, an anatomical variant. Atherosclerotic calcification of the aorta. SKELETON: No abnormal hypermetabolism. Incidental CT findings: Minimal degenerative change in the spine. IMPRESSION: 1. Hypermetabolic cervical mass, compatible with the provided history of cervical cancer. No evidence of metastatic disease. 2. Aortic atherosclerosis (ICD10-I70.0). Coronary artery calcification. Electronically Signed    By: Leanna Battles M.D.   On: 12/01/2023 16:15      12/05/2023 Imaging   1. Right lateral cervical mass is identified measuring 4.9 x 4.0 x 4.0 cm. Positive for parametrial invasion. Tumor extension into the upper 2/3 of the vagina is visualized. There is tumor extension up to the rectum with loss of fat plane between the anterior wall of the rectum and the mass. Anterior extension of the tumor extends up to the posterior wall of the bladder with focal area of loss of fat plane seen. Cannot exclude bladder involvement. 2. No signs of hydroureter. 3. No signs of pelvic lymphadenopathy or peritoneal nodularity. 4. Left fundal submucosal fibroid shows diffuse enhancement measuring 2.2 x 2.2 cm   12/11/2023 Procedure   Successful placement of a right IJ approach Power Port with ultrasound and fluoroscopic guidance. The catheter is ready for use.   12/19/2023 -  Chemotherapy   Patient is on Treatment Plan : cervical cancer Cisplatin (40) q7d       PHYSICAL EXAMINATION: ECOG PERFORMANCE STATUS: 0 - Asymptomatic  Vitals:   01/01/24 0904  BP: 124/69  Pulse: 89  Resp: 18  Temp: 97.7 F (36.5 C)  SpO2: 100%   Filed Weights   01/01/24 0904  Weight: 116 lb 12.8 oz (53 kg)    GENERAL:alert, no distress and comfortable SKIN: No rash on exam NEURO: alert & oriented x 3 with fluent speech, no focal motor/sensory deficits  LABORATORY DATA:  I have reviewed the data as listed    Component Value Date/Time   NA 135 12/31/2023 0841   K 3.7 12/31/2023 0841  CL 99 12/31/2023 0841   CO2 31 12/31/2023 0841   GLUCOSE 100 (H) 12/31/2023 0841   BUN 17 12/31/2023 0841   CREATININE 0.87 12/31/2023 0841   CALCIUM 9.1 12/31/2023 0841   GFRNONAA >60 12/31/2023 0841   GFRAA 73 (L) 11/09/2014 0719    No results found for: "SPEP", "UPEP"  Lab Results  Component Value Date   WBC 6.5 12/31/2023   NEUTROABS 5.2 12/31/2023   HGB 13.0 12/31/2023   HCT 38.5 12/31/2023   MCV 94.6  12/31/2023   PLT 180 12/31/2023      Chemistry      Component Value Date/Time   NA 135 12/31/2023 0841   K 3.7 12/31/2023 0841   CL 99 12/31/2023 0841   CO2 31 12/31/2023 0841   BUN 17 12/31/2023 0841   CREATININE 0.87 12/31/2023 0841      Component Value Date/Time   CALCIUM 9.1 12/31/2023 0841

## 2024-01-02 ENCOUNTER — Ambulatory Visit: Payer: Medicare Other

## 2024-01-02 ENCOUNTER — Other Ambulatory Visit: Payer: Self-pay

## 2024-01-02 ENCOUNTER — Inpatient Hospital Stay: Payer: Medicare Other

## 2024-01-02 ENCOUNTER — Ambulatory Visit
Admission: RE | Admit: 2024-01-02 | Discharge: 2024-01-02 | Disposition: A | Discharge status: Home / Self Care | Payer: Medicare Other | Source: Ambulatory Visit | Attending: Radiation Oncology | Admitting: Radiation Oncology

## 2024-01-02 VITALS — BP 124/68 | HR 78 | Temp 98.2°F | Resp 18

## 2024-01-02 DIAGNOSIS — C530 Malignant neoplasm of endocervix: Secondary | ICD-10-CM

## 2024-01-02 DIAGNOSIS — R300 Dysuria: Secondary | ICD-10-CM | POA: Diagnosis not present

## 2024-01-02 DIAGNOSIS — Z5111 Encounter for antineoplastic chemotherapy: Secondary | ICD-10-CM | POA: Diagnosis not present

## 2024-01-02 DIAGNOSIS — Z510 Encounter for antineoplastic radiation therapy: Secondary | ICD-10-CM | POA: Diagnosis not present

## 2024-01-02 LAB — RAD ONC ARIA SESSION SUMMARY
Course Elapsed Days: 22
Plan Fractions Treated to Date: 12
Plan Prescribed Dose Per Fraction: 1.8 Gy
Plan Total Fractions Prescribed: 20
Plan Total Prescribed Dose: 36 Gy
Reference Point Dosage Given to Date: 21.6 Gy
Reference Point Session Dosage Given: 1.8 Gy
Session Number: 17

## 2024-01-02 MED ORDER — PALONOSETRON HCL INJECTION 0.25 MG/5ML
0.2500 mg | Freq: Once | INTRAVENOUS | Status: AC
  Administered 2024-01-02: 0.25 mg via INTRAVENOUS
  Filled 2024-01-02: qty 5

## 2024-01-02 MED ORDER — SODIUM CHLORIDE 0.9 % IV SOLN
INTRAVENOUS | Status: DC
Start: 2024-01-02 — End: 2024-01-02

## 2024-01-02 MED ORDER — SODIUM CHLORIDE 0.9 % IV SOLN
40.0000 mg/m2 | Freq: Once | INTRAVENOUS | Status: AC
  Administered 2024-01-02: 60 mg via INTRAVENOUS
  Filled 2024-01-02: qty 60

## 2024-01-02 MED ORDER — DEXAMETHASONE SODIUM PHOSPHATE 10 MG/ML IJ SOLN
10.0000 mg | Freq: Once | INTRAMUSCULAR | Status: AC
  Administered 2024-01-02: 10 mg via INTRAVENOUS
  Filled 2024-01-02: qty 1

## 2024-01-02 MED ORDER — POTASSIUM CHLORIDE IN NACL 20-0.9 MEQ/L-% IV SOLN
Freq: Once | INTRAVENOUS | Status: AC
  Filled 2024-01-02: qty 1000

## 2024-01-02 MED ORDER — MAGNESIUM SULFATE 2 GM/50ML IV SOLN
2.0000 g | Freq: Once | INTRAVENOUS | Status: AC
  Administered 2024-01-02: 2 g via INTRAVENOUS
  Filled 2024-01-02: qty 50

## 2024-01-02 MED ORDER — SODIUM CHLORIDE 0.9% FLUSH
10.0000 mL | INTRAVENOUS | Status: DC | PRN
  Administered 2024-01-02: 10 mL

## 2024-01-02 MED ORDER — FAMOTIDINE 20 MG PO TABS
20.0000 mg | ORAL_TABLET | Freq: Once | ORAL | Status: AC
  Administered 2024-01-02: 20 mg via ORAL
  Filled 2024-01-02: qty 1

## 2024-01-02 MED ORDER — HEPARIN SOD (PORK) LOCK FLUSH 100 UNIT/ML IV SOLN
500.0000 [IU] | Freq: Once | INTRAVENOUS | Status: AC | PRN
  Administered 2024-01-02: 500 [IU]

## 2024-01-02 NOTE — Patient Instructions (Signed)

## 2024-01-05 ENCOUNTER — Ambulatory Visit
Admission: RE | Admit: 2024-01-05 | Discharge: 2024-01-05 | Disposition: A | Discharge status: Home / Self Care | Payer: Medicare Other | Source: Ambulatory Visit | Attending: Radiation Oncology | Admitting: Radiation Oncology

## 2024-01-05 ENCOUNTER — Ambulatory Visit: Payer: Medicare Other

## 2024-01-05 ENCOUNTER — Other Ambulatory Visit: Payer: Self-pay

## 2024-01-05 DIAGNOSIS — Z510 Encounter for antineoplastic radiation therapy: Secondary | ICD-10-CM | POA: Diagnosis not present

## 2024-01-05 DIAGNOSIS — Z5111 Encounter for antineoplastic chemotherapy: Secondary | ICD-10-CM | POA: Diagnosis not present

## 2024-01-05 DIAGNOSIS — R300 Dysuria: Secondary | ICD-10-CM | POA: Diagnosis not present

## 2024-01-05 LAB — RAD ONC ARIA SESSION SUMMARY
Course Elapsed Days: 25
Plan Fractions Treated to Date: 13
Plan Prescribed Dose Per Fraction: 1.8 Gy
Plan Total Fractions Prescribed: 20
Plan Total Prescribed Dose: 36 Gy
Reference Point Dosage Given to Date: 23.4 Gy
Reference Point Session Dosage Given: 1.8 Gy
Session Number: 18

## 2024-01-06 ENCOUNTER — Ambulatory Visit: Payer: Medicare Other

## 2024-01-06 ENCOUNTER — Other Ambulatory Visit: Payer: Self-pay

## 2024-01-06 ENCOUNTER — Ambulatory Visit
Admission: RE | Admit: 2024-01-06 | Discharge: 2024-01-06 | Disposition: A | Discharge status: Home / Self Care | Payer: Medicare Other | Source: Ambulatory Visit | Attending: Radiation Oncology | Admitting: Radiation Oncology

## 2024-01-06 ENCOUNTER — Telehealth: Payer: Self-pay | Admitting: Oncology

## 2024-01-06 DIAGNOSIS — R300 Dysuria: Secondary | ICD-10-CM | POA: Diagnosis not present

## 2024-01-06 DIAGNOSIS — Z510 Encounter for antineoplastic radiation therapy: Secondary | ICD-10-CM | POA: Diagnosis not present

## 2024-01-06 DIAGNOSIS — Z5111 Encounter for antineoplastic chemotherapy: Secondary | ICD-10-CM | POA: Diagnosis not present

## 2024-01-06 LAB — RAD ONC ARIA SESSION SUMMARY
Course Elapsed Days: 26
Plan Fractions Treated to Date: 14
Plan Prescribed Dose Per Fraction: 1.8 Gy
Plan Total Fractions Prescribed: 20
Plan Total Prescribed Dose: 36 Gy
Reference Point Dosage Given to Date: 25.2 Gy
Reference Point Session Dosage Given: 1.8 Gy
Session Number: 19

## 2024-01-06 NOTE — Telephone Encounter (Signed)
Left a message with Angie at [facility] about PD-L1 and LVI results.  Requested a return call.

## 2024-01-07 ENCOUNTER — Ambulatory Visit
Admission: RE | Admit: 2024-01-07 | Discharge: 2024-01-07 | Disposition: A | Discharge status: Home / Self Care | Payer: Medicare Other | Source: Ambulatory Visit | Attending: Radiation Oncology | Admitting: Radiation Oncology

## 2024-01-07 ENCOUNTER — Other Ambulatory Visit: Payer: Self-pay

## 2024-01-07 ENCOUNTER — Encounter: Payer: Self-pay | Admitting: Obstetrics

## 2024-01-07 ENCOUNTER — Ambulatory Visit: Payer: Medicare Other

## 2024-01-07 ENCOUNTER — Inpatient Hospital Stay: Payer: Medicare Other

## 2024-01-07 DIAGNOSIS — R300 Dysuria: Secondary | ICD-10-CM | POA: Diagnosis not present

## 2024-01-07 DIAGNOSIS — C530 Malignant neoplasm of endocervix: Secondary | ICD-10-CM

## 2024-01-07 DIAGNOSIS — Z510 Encounter for antineoplastic radiation therapy: Secondary | ICD-10-CM | POA: Diagnosis not present

## 2024-01-07 DIAGNOSIS — Z5111 Encounter for antineoplastic chemotherapy: Secondary | ICD-10-CM | POA: Diagnosis not present

## 2024-01-07 LAB — RAD ONC ARIA SESSION SUMMARY
Course Elapsed Days: 27
Plan Fractions Treated to Date: 15
Plan Prescribed Dose Per Fraction: 1.8 Gy
Plan Total Fractions Prescribed: 20
Plan Total Prescribed Dose: 36 Gy
Reference Point Dosage Given to Date: 27 Gy
Reference Point Session Dosage Given: 1.8 Gy
Session Number: 20

## 2024-01-07 LAB — BASIC METABOLIC PANEL - CANCER CENTER ONLY
Anion gap: 3 — ABNORMAL LOW (ref 5–15)
BUN: 16 mg/dL (ref 8–23)
CO2: 33 mmol/L — ABNORMAL HIGH (ref 22–32)
Calcium: 8.9 mg/dL (ref 8.9–10.3)
Chloride: 97 mmol/L — ABNORMAL LOW (ref 98–111)
Creatinine: 0.81 mg/dL (ref 0.44–1.00)
GFR, Estimated: >60 mL/min (ref >60)
Glucose, Bld: 86 mg/dL (ref 70–99)
Potassium: 3.8 mmol/L (ref 3.5–5.1)
Sodium: 133 mmol/L — ABNORMAL LOW (ref 135–145)

## 2024-01-07 LAB — CBC WITH DIFFERENTIAL (CANCER CENTER ONLY)
Abs Immature Granulocytes: 0.02 10*3/uL (ref 0.00–0.07)
Basophils Absolute: 0 10*3/uL (ref 0.0–0.1)
Basophils Relative: 1 %
Eosinophils Absolute: 0.1 10*3/uL (ref 0.0–0.5)
Eosinophils Relative: 4 %
HCT: 35.3 % — ABNORMAL LOW (ref 36.0–46.0)
Hemoglobin: 12.1 g/dL (ref 12.0–15.0)
Immature Granulocytes: 1 %
Lymphocytes Relative: 8 %
Lymphs Abs: 0.3 10*3/uL — ABNORMAL LOW (ref 0.7–4.0)
MCH: 32.1 pg (ref 26.0–34.0)
MCHC: 34.3 g/dL (ref 30.0–36.0)
MCV: 93.6 fL (ref 80.0–100.0)
Monocytes Absolute: 0.4 10*3/uL (ref 0.1–1.0)
Monocytes Relative: 12 %
Neutro Abs: 2.8 10*3/uL (ref 1.7–7.7)
Neutrophils Relative %: 74 %
Platelet Count: 134 10*3/uL — ABNORMAL LOW (ref 150–400)
RBC: 3.77 MIL/uL — ABNORMAL LOW (ref 3.87–5.11)
RDW: 13.7 % (ref 11.5–15.5)
WBC Count: 3.7 10*3/uL — ABNORMAL LOW (ref 4.0–10.5)
nRBC: 0 % (ref 0.0–0.2)

## 2024-01-07 LAB — MAGNESIUM: Magnesium: 1.9 mg/dL (ref 1.7–2.4)

## 2024-01-08 ENCOUNTER — Ambulatory Visit: Payer: Medicare Other

## 2024-01-08 ENCOUNTER — Inpatient Hospital Stay: Payer: Medicare Other | Admitting: Hematology and Oncology

## 2024-01-08 ENCOUNTER — Ambulatory Visit
Admission: RE | Admit: 2024-01-08 | Discharge: 2024-01-08 | Discharge status: Home / Self Care | Payer: Medicare Other | Source: Ambulatory Visit | Attending: Radiation Oncology | Admitting: Radiation Oncology

## 2024-01-08 ENCOUNTER — Encounter: Payer: Self-pay | Admitting: Hematology and Oncology

## 2024-01-08 ENCOUNTER — Other Ambulatory Visit: Payer: Self-pay

## 2024-01-08 VITALS — BP 119/69 | HR 75 | Temp 97.7°F | Resp 18 | Ht 60.0 in | Wt 119.4 lb

## 2024-01-08 DIAGNOSIS — Z510 Encounter for antineoplastic radiation therapy: Secondary | ICD-10-CM | POA: Diagnosis not present

## 2024-01-08 DIAGNOSIS — D61818 Other pancytopenia: Secondary | ICD-10-CM | POA: Diagnosis not present

## 2024-01-08 DIAGNOSIS — Z5111 Encounter for antineoplastic chemotherapy: Secondary | ICD-10-CM | POA: Diagnosis not present

## 2024-01-08 DIAGNOSIS — R110 Nausea: Secondary | ICD-10-CM | POA: Insufficient documentation

## 2024-01-08 DIAGNOSIS — C530 Malignant neoplasm of endocervix: Secondary | ICD-10-CM | POA: Diagnosis not present

## 2024-01-08 DIAGNOSIS — R300 Dysuria: Secondary | ICD-10-CM | POA: Diagnosis not present

## 2024-01-08 LAB — RAD ONC ARIA SESSION SUMMARY
Course Elapsed Days: 28
Plan Fractions Treated to Date: 16
Plan Prescribed Dose Per Fraction: 1.8 Gy
Plan Total Fractions Prescribed: 20
Plan Total Prescribed Dose: 36 Gy
Reference Point Dosage Given to Date: 28.8 Gy
Reference Point Session Dosage Given: 1.8 Gy
Session Number: 21

## 2024-01-08 NOTE — Assessment & Plan Note (Signed)
She had daily nausea I recommend prednisone for 2 days after chemotherapy Will reassess next week She will continue antiemetics

## 2024-01-08 NOTE — Assessment & Plan Note (Signed)
Due to recent treatment Monitor closely

## 2024-01-08 NOTE — Progress Notes (Signed)
[facility] OFFICE PROGRESS NOTE  Patient Care Team: Benita Stabile, MD as PCP - General (Internal Medicine)  ASSESSMENT & PLAN:  Cervical cancer [facility]) She tolerated treatment well Her pelvic bleeding has stopped She had mild pancytopenia and some nausea which is not unexpected side effects of treatment We will proceed with treatment as scheduled  Pancytopenia, acquired (HCC) Due to recent treatment Monitor closely  Nausea without vomiting She had daily nausea I recommend prednisone for 2 days after chemotherapy Will reassess next week She will continue antiemetics  No orders of the defined types were placed in this encounter.   All questions were answered. The patient knows to call the clinic with any problems, questions or concerns. The total time spent in the appointment was 20 minutes encounter with patients including review of chart and various tests results, discussions about plan of care and coordination of care plan   Artis Delay, MD 01/08/2024 12:27 PM  INTERVAL HISTORY: Please see below for problem oriented charting. she returns for chemo follow-up She is able to maintain her weight No peripheral neuropathy She has some mild nausea but no vomiting Denies recent bleeding  REVIEW OF SYSTEMS:   Constitutional: Denies fevers, chills or abnormal weight loss Eyes: Denies blurriness of vision Ears, nose, mouth, throat, and face: Denies mucositis or sore throat Respiratory: Denies cough, dyspnea or wheezes Cardiovascular: Denies palpitation, chest discomfort or lower extremity swelling Skin: Denies abnormal skin rashes Lymphatics: Denies new lymphadenopathy or easy bruising Neurological:Denies numbness, tingling or new weaknesses Behavioral/Psych: Mood is stable, no new changes  All other systems were reviewed with the patient and are negative.  I have reviewed the past medical history, past surgical history, social history and family history with the  patient and they are unchanged from previous note.  ALLERGIES:  is allergic to [facility] dimeglumine].  MEDICATIONS:  Current Outpatient Medications  Medication Sig Dispense Refill   atorvastatin (LIPITOR) 10 MG tablet Take 10 mg by mouth 3 (three) times a week.     calcium-vitamin D (OSCAL WITH D) 500-200 MG-UNIT per tablet Take 1 tablet by mouth daily.     lidocaine-prilocaine (EMLA) cream Apply to affected area once (Patient not taking: Reported on 12/03/2023) 30 g 3   Multiple Vitamins-Minerals (MULTIVITAMIN WOMEN PO) Take 1 tablet by mouth daily.     ondansetron (ZOFRAN) 8 MG tablet Take 1 tablet (8 mg total) by mouth every 8 (eight) hours as needed for nausea or vomiting. Start on the third day after cisplatin. (Patient not taking: Reported on 12/03/2023) 30 tablet 1   oxyCODONE-acetaminophen (PERCOCET/ROXICET) 5-325 MG tablet Take 1 tablet by mouth every 6 (six) hours as needed for severe pain (pain score 7-10). 20 tablet 0   phenazopyridine (PYRIDIUM) 200 MG tablet Take 1 tablet (200 mg total) by mouth 3 (three) times daily as needed for pain. 20 tablet 1   prochlorperazine (COMPAZINE) 10 MG tablet Take 1 tablet (10 mg total) by mouth every 6 (six) hours as needed (Nausea or vomiting). (Patient not taking: Reported on 12/03/2023) 30 tablet 1   No current facility-administered medications for this visit.    SUMMARY OF ONCOLOGIC HISTORY: Oncology History  Cervical cancer (HCC)  09/19/2023 Initial Diagnosis   Patient developed discharge in 08/2023 with postmenopausal bleeding starting in 09/2023.    11/07/2023 Initial Biopsy   Report available at [facility] Cervical mass biopsy: Poorly differentiated invasive squamous cell carcinoma   11/07/2023 Imaging   Office ultrasound shows a uterus measuring  6.4 x 4 x 2.1 cm with an endometrial lining measuring 0.9 mm with small endometrial fluid.  Normal bilateral adnexa.  Solid, inhomogenous lesion within the cervix measuring 3.8 x 3.2  x 3.8 cm.   11/20/2023 Initial Diagnosis   Cervical cancer (HCC)   12/01/2023 Cancer Staging   Staging form: Cervix Uteri, AJCC Version 9 - Clinical stage from 12/01/2023: FIGO Stage IIB (cT2b, cN0, cM0) - Signed by Artis Delay, MD on 12/01/2023 Stage prefix: Initial diagnosis   12/01/2023 PET scan   NM PET Image Initial (PI) Skull Base To Thigh (F-18 FDG) Result Date: 12/01/2023 CLINICAL DATA:  Initial treatment strategy for cervical cancer. EXAM: NUCLEAR MEDICINE PET SKULL BASE TO THIGH TECHNIQUE: 6.8 mCi F-18 FDG was injected intravenously. Full-ring PET imaging was performed from the skull base to thigh after the radiotracer. CT data was obtained and used for attenuation correction and anatomic localization. Fasting blood glucose: 120 mg/dl COMPARISON:  None Available. FINDINGS: Mediastinal blood pool activity: SUV max 2.5 Liver activity: SUV max NA NECK: No abnormal hypermetabolism. Incidental CT findings: None. CHEST: No abnormal hypermetabolism. Incidental CT findings: Atherosclerotic calcification of the aorta and left anterior descending coronary artery. Heart is at the upper limits of normal in size to mildly enlarged. No pericardial or pleural effusion. ABDOMEN/PELVIS: A mass involving the cervix, and likely lower uterine segment, measures approximately 3.8 x 4.6 cm (4/156), SUV max 25.9. No additional abnormal hypermetabolism. Incidental CT findings: Cholecystectomy. Kidneys appear non rotated, an anatomical variant. Atherosclerotic calcification of the aorta. SKELETON: No abnormal hypermetabolism. Incidental CT findings: Minimal degenerative change in the spine. IMPRESSION: 1. Hypermetabolic cervical mass, compatible with the provided history of cervical cancer. No evidence of metastatic disease. 2. Aortic atherosclerosis (ICD10-I70.0). Coronary artery calcification. Electronically Signed   By: Leanna Battles M.D.   On: 12/01/2023 16:15      12/05/2023 Imaging   1. Right lateral cervical mass  is identified measuring 4.9 x 4.0 x 4.0 cm. Positive for parametrial invasion. Tumor extension into the upper 2/3 of the vagina is visualized. There is tumor extension up to the rectum with loss of fat plane between the anterior wall of the rectum and the mass. Anterior extension of the tumor extends up to the posterior wall of the bladder with focal area of loss of fat plane seen. Cannot exclude bladder involvement. 2. No signs of hydroureter. 3. No signs of pelvic lymphadenopathy or peritoneal nodularity. 4. Left fundal submucosal fibroid shows diffuse enhancement measuring 2.2 x 2.2 cm   12/11/2023 Procedure   Successful placement of a right IJ approach Power Port with ultrasound and fluoroscopic guidance. The catheter is ready for use.   12/19/2023 -  Chemotherapy   Patient is on Treatment Plan : cervical cancer Cisplatin (40) q7d       PHYSICAL EXAMINATION: ECOG PERFORMANCE STATUS: 1 - Symptomatic but completely ambulatory  Vitals:   01/08/24 1133  BP: 119/69  Pulse: 75  Resp: 18  Temp: 97.7 F (36.5 C)  SpO2: 100%   Filed Weights   01/08/24 1133  Weight: 119 lb 6.4 oz (54.2 kg)    GENERAL:alert, no distress and comfortable LABORATORY DATA:  I have reviewed the data as listed    Component Value Date/Time   NA 133 (L) 01/07/2024 0838   K 3.8 01/07/2024 0838   CL 97 (L) 01/07/2024 0838   CO2 33 (H) 01/07/2024 0838   GLUCOSE 86 01/07/2024 0838   BUN 16 01/07/2024 0838   CREATININE 0.81 01/07/2024  5621   CALCIUM 8.9 01/07/2024 0838   GFRNONAA >60 01/07/2024 0838   GFRAA 73 (L) 11/09/2014 0719    No results found for: "SPEP", "UPEP"  Lab Results  Component Value Date   WBC 3.7 (L) 01/07/2024   NEUTROABS 2.8 01/07/2024   HGB 12.1 01/07/2024   HCT 35.3 (L) 01/07/2024   MCV 93.6 01/07/2024   PLT 134 (L) 01/07/2024      Chemistry      Component Value Date/Time   NA 133 (L) 01/07/2024 0838   K 3.8 01/07/2024 0838   CL 97 (L) 01/07/2024 0838   CO2 33  (H) 01/07/2024 0838   BUN 16 01/07/2024 0838   CREATININE 0.81 01/07/2024 0838      Component Value Date/Time   CALCIUM 8.9 01/07/2024 0838

## 2024-01-08 NOTE — Assessment & Plan Note (Signed)
She tolerated treatment well Her pelvic bleeding has stopped She had mild pancytopenia and some nausea which is not unexpected side effects of treatment We will proceed with treatment as scheduled

## 2024-01-09 ENCOUNTER — Inpatient Hospital Stay: Payer: Medicare Other

## 2024-01-09 ENCOUNTER — Ambulatory Visit: Payer: Medicare Other

## 2024-01-09 ENCOUNTER — Other Ambulatory Visit: Payer: Self-pay

## 2024-01-09 ENCOUNTER — Ambulatory Visit
Admission: RE | Admit: 2024-01-09 | Discharge: 2024-01-09 | Disposition: A | Discharge status: Home / Self Care | Payer: Medicare Other | Source: Ambulatory Visit | Attending: Radiation Oncology | Admitting: Radiation Oncology

## 2024-01-09 VITALS — BP 118/62 | HR 72 | Temp 98.2°F | Resp 18 | Wt 118.4 lb

## 2024-01-09 DIAGNOSIS — C530 Malignant neoplasm of endocervix: Secondary | ICD-10-CM

## 2024-01-09 DIAGNOSIS — Z5111 Encounter for antineoplastic chemotherapy: Secondary | ICD-10-CM | POA: Diagnosis not present

## 2024-01-09 DIAGNOSIS — Z510 Encounter for antineoplastic radiation therapy: Secondary | ICD-10-CM | POA: Diagnosis not present

## 2024-01-09 DIAGNOSIS — R300 Dysuria: Secondary | ICD-10-CM | POA: Diagnosis not present

## 2024-01-09 LAB — RAD ONC ARIA SESSION SUMMARY
Course Elapsed Days: 29
Plan Fractions Treated to Date: 17
Plan Prescribed Dose Per Fraction: 1.8 Gy
Plan Total Fractions Prescribed: 20
Plan Total Prescribed Dose: 36 Gy
Reference Point Dosage Given to Date: 30.6 Gy
Reference Point Session Dosage Given: 1.8 Gy
Session Number: 22

## 2024-01-09 MED ORDER — POTASSIUM CHLORIDE IN NACL 20-0.9 MEQ/L-% IV SOLN
Freq: Once | INTRAVENOUS | Status: AC
Start: 2024-01-09 — End: 2024-01-09
  Filled 2024-01-09: qty 1000

## 2024-01-09 MED ORDER — DEXAMETHASONE SODIUM PHOSPHATE 10 MG/ML IJ SOLN
10.0000 mg | Freq: Once | INTRAMUSCULAR | Status: AC
  Administered 2024-01-09: 10 mg via INTRAVENOUS
  Filled 2024-01-09: qty 1

## 2024-01-09 MED ORDER — SODIUM CHLORIDE 0.9 % IV SOLN: INTRAVENOUS | Status: DC

## 2024-01-09 MED ORDER — FAMOTIDINE 20 MG PO TABS
20.0000 mg | ORAL_TABLET | Freq: Once | ORAL | Status: AC
  Administered 2024-01-09: 20 mg via ORAL
  Filled 2024-01-09: qty 1

## 2024-01-09 MED ORDER — PALONOSETRON HCL INJECTION 0.25 MG/5ML
0.2500 mg | Freq: Once | INTRAVENOUS | Status: AC
  Administered 2024-01-09: 0.25 mg via INTRAVENOUS
  Filled 2024-01-09: qty 5

## 2024-01-09 MED ORDER — SODIUM CHLORIDE 0.9 % IV SOLN
40.0000 mg/m2 | Freq: Once | INTRAVENOUS | Status: AC
  Administered 2024-01-09: 60 mg via INTRAVENOUS
  Filled 2024-01-09: qty 60

## 2024-01-09 MED ORDER — MAGNESIUM SULFATE 2 GM/50ML IV SOLN
2.0000 g | Freq: Once | INTRAVENOUS | Status: AC
Start: 2024-01-09 — End: 2024-01-09
  Administered 2024-01-09: 2 g via INTRAVENOUS
  Filled 2024-01-09: qty 50

## 2024-01-12 ENCOUNTER — Ambulatory Visit: Payer: Medicare Other

## 2024-01-12 ENCOUNTER — Other Ambulatory Visit (HOSPITAL_COMMUNITY): Payer: Self-pay | Admitting: Radiation Oncology

## 2024-01-12 ENCOUNTER — Ambulatory Visit
Admission: RE | Admit: 2024-01-12 | Discharge: 2024-01-12 | Disposition: A | Discharge status: Home / Self Care | Payer: Medicare Other | Source: Ambulatory Visit | Attending: Radiation Oncology | Admitting: Radiation Oncology

## 2024-01-12 ENCOUNTER — Other Ambulatory Visit: Payer: Self-pay

## 2024-01-12 DIAGNOSIS — Z5111 Encounter for antineoplastic chemotherapy: Secondary | ICD-10-CM | POA: Diagnosis not present

## 2024-01-12 DIAGNOSIS — C530 Malignant neoplasm of endocervix: Secondary | ICD-10-CM

## 2024-01-12 DIAGNOSIS — Z510 Encounter for antineoplastic radiation therapy: Secondary | ICD-10-CM | POA: Diagnosis not present

## 2024-01-12 DIAGNOSIS — R300 Dysuria: Secondary | ICD-10-CM | POA: Diagnosis not present

## 2024-01-12 LAB — RAD ONC ARIA SESSION SUMMARY
Course Elapsed Days: 32
Plan Fractions Treated to Date: 18
Plan Prescribed Dose Per Fraction: 1.8 Gy
Plan Total Fractions Prescribed: 20
Plan Total Prescribed Dose: 36 Gy
Reference Point Dosage Given to Date: 32.4 Gy
Reference Point Session Dosage Given: 1.8 Gy
Session Number: 23

## 2024-01-13 ENCOUNTER — Ambulatory Visit: Payer: Medicare Other

## 2024-01-13 ENCOUNTER — Ambulatory Visit
Admission: RE | Admit: 2024-01-13 | Discharge: 2024-01-13 | Disposition: A | Discharge status: Home / Self Care | Payer: Medicare Other | Source: Ambulatory Visit | Attending: Radiation Oncology | Admitting: Radiation Oncology

## 2024-01-13 ENCOUNTER — Other Ambulatory Visit: Payer: Self-pay

## 2024-01-13 DIAGNOSIS — R300 Dysuria: Secondary | ICD-10-CM | POA: Diagnosis not present

## 2024-01-13 DIAGNOSIS — Z510 Encounter for antineoplastic radiation therapy: Secondary | ICD-10-CM | POA: Diagnosis not present

## 2024-01-13 DIAGNOSIS — Z5111 Encounter for antineoplastic chemotherapy: Secondary | ICD-10-CM | POA: Diagnosis not present

## 2024-01-13 LAB — RAD ONC ARIA SESSION SUMMARY
Course Elapsed Days: 33
Plan Fractions Treated to Date: 19
Plan Prescribed Dose Per Fraction: 1.8 Gy
Plan Total Fractions Prescribed: 20
Plan Total Prescribed Dose: 36 Gy
Reference Point Dosage Given to Date: 34.2 Gy
Reference Point Session Dosage Given: 1.8 Gy
Session Number: 24

## 2024-01-13 NOTE — Progress Notes (Signed)
 Sent message, via epic in basket, requesting orders in epic from Careers adviser.

## 2024-01-14 ENCOUNTER — Other Ambulatory Visit: Payer: Self-pay

## 2024-01-14 ENCOUNTER — Ambulatory Visit
Admission: RE | Admit: 2024-01-14 | Discharge: 2024-01-14 | Disposition: A | Discharge status: Home / Self Care | Payer: Medicare Other | Source: Ambulatory Visit | Attending: Radiation Oncology | Admitting: Radiation Oncology

## 2024-01-14 ENCOUNTER — Ambulatory Visit: Payer: Medicare Other

## 2024-01-14 ENCOUNTER — Inpatient Hospital Stay: Payer: Medicare Other

## 2024-01-14 DIAGNOSIS — R300 Dysuria: Secondary | ICD-10-CM | POA: Diagnosis not present

## 2024-01-14 DIAGNOSIS — C530 Malignant neoplasm of endocervix: Secondary | ICD-10-CM

## 2024-01-14 DIAGNOSIS — Z5111 Encounter for antineoplastic chemotherapy: Secondary | ICD-10-CM | POA: Diagnosis not present

## 2024-01-14 DIAGNOSIS — Z510 Encounter for antineoplastic radiation therapy: Secondary | ICD-10-CM | POA: Diagnosis not present

## 2024-01-14 LAB — CBC WITH DIFFERENTIAL (CANCER CENTER ONLY)
Abs Immature Granulocytes: 0.01 10*3/uL (ref 0.00–0.07)
Basophils Absolute: 0 10*3/uL (ref 0.0–0.1)
Basophils Relative: 1 %
Eosinophils Absolute: 0.3 10*3/uL (ref 0.0–0.5)
Eosinophils Relative: 12 %
HCT: 32.7 % — ABNORMAL LOW (ref 36.0–46.0)
Hemoglobin: 11.2 g/dL — ABNORMAL LOW (ref 12.0–15.0)
Immature Granulocytes: 1 %
Lymphocytes Relative: 9 %
Lymphs Abs: 0.2 10*3/uL — ABNORMAL LOW (ref 0.7–4.0)
MCH: 32.7 pg (ref 26.0–34.0)
MCHC: 34.3 g/dL (ref 30.0–36.0)
MCV: 95.3 fL (ref 80.0–100.0)
Monocytes Absolute: 0.2 10*3/uL (ref 0.1–1.0)
Monocytes Relative: 10 %
Neutro Abs: 1.5 10*3/uL — ABNORMAL LOW (ref 1.7–7.7)
Neutrophils Relative %: 67 %
Platelet Count: 118 10*3/uL — ABNORMAL LOW (ref 150–400)
RBC: 3.43 MIL/uL — ABNORMAL LOW (ref 3.87–5.11)
RDW: 14.7 % (ref 11.5–15.5)
WBC Count: 2.2 10*3/uL — ABNORMAL LOW (ref 4.0–10.5)
nRBC: 0 % (ref 0.0–0.2)

## 2024-01-14 LAB — BASIC METABOLIC PANEL - CANCER CENTER ONLY
Anion gap: 4 — ABNORMAL LOW (ref 5–15)
BUN: 14 mg/dL (ref 8–23)
CO2: 32 mmol/L (ref 22–32)
Calcium: 8.7 mg/dL — ABNORMAL LOW (ref 8.9–10.3)
Chloride: 101 mmol/L (ref 98–111)
Creatinine: 0.81 mg/dL (ref 0.44–1.00)
GFR, Estimated: >60 mL/min (ref >60)
Glucose, Bld: 109 mg/dL — ABNORMAL HIGH (ref 70–99)
Potassium: 3.9 mmol/L (ref 3.5–5.1)
Sodium: 137 mmol/L (ref 135–145)

## 2024-01-14 LAB — RAD ONC ARIA SESSION SUMMARY
Course Elapsed Days: 34
Plan Fractions Treated to Date: 20
Plan Prescribed Dose Per Fraction: 1.8 Gy
Plan Total Fractions Prescribed: 20
Plan Total Prescribed Dose: 36 Gy
Reference Point Dosage Given to Date: 36 Gy
Reference Point Session Dosage Given: 1.8 Gy
Session Number: 25

## 2024-01-14 LAB — MAGNESIUM: Magnesium: 1.7 mg/dL (ref 1.7–2.4)

## 2024-01-15 ENCOUNTER — Ambulatory Visit: Payer: Medicare Other

## 2024-01-15 ENCOUNTER — Ambulatory Visit
Admission: RE | Admit: 2024-01-15 | Discharge: 2024-01-15 | Disposition: A | Discharge status: Home / Self Care | Payer: Medicare Other | Source: Ambulatory Visit | Attending: Radiation Oncology | Admitting: Radiation Oncology

## 2024-01-15 ENCOUNTER — Other Ambulatory Visit: Payer: Self-pay

## 2024-01-15 ENCOUNTER — Inpatient Hospital Stay: Payer: Medicare Other | Admitting: Hematology and Oncology

## 2024-01-15 VITALS — BP 110/65 | HR 78 | Temp 98.5°F | Resp 18 | Ht 60.0 in | Wt 119.8 lb

## 2024-01-15 DIAGNOSIS — D61818 Other pancytopenia: Secondary | ICD-10-CM | POA: Diagnosis not present

## 2024-01-15 DIAGNOSIS — R197 Diarrhea, unspecified: Secondary | ICD-10-CM

## 2024-01-15 DIAGNOSIS — C530 Malignant neoplasm of endocervix: Secondary | ICD-10-CM

## 2024-01-15 DIAGNOSIS — R110 Nausea: Secondary | ICD-10-CM

## 2024-01-15 DIAGNOSIS — H9313 Tinnitus, bilateral: Secondary | ICD-10-CM

## 2024-01-15 DIAGNOSIS — Z510 Encounter for antineoplastic radiation therapy: Secondary | ICD-10-CM | POA: Diagnosis not present

## 2024-01-15 DIAGNOSIS — Z5111 Encounter for antineoplastic chemotherapy: Secondary | ICD-10-CM | POA: Diagnosis not present

## 2024-01-15 DIAGNOSIS — R300 Dysuria: Secondary | ICD-10-CM | POA: Diagnosis not present

## 2024-01-15 LAB — RAD ONC ARIA SESSION SUMMARY
Course Elapsed Days: 35
Plan Fractions Treated to Date: 1
Plan Prescribed Dose Per Fraction: 1.8 Gy
Plan Total Fractions Prescribed: 5
Plan Total Prescribed Dose: 9 Gy
Reference Point Dosage Given to Date: 1.8 Gy
Reference Point Session Dosage Given: 1.8 Gy
Session Number: 26

## 2024-01-16 ENCOUNTER — Ambulatory Visit: Payer: Medicare Other

## 2024-01-16 ENCOUNTER — Other Ambulatory Visit: Payer: Self-pay

## 2024-01-16 ENCOUNTER — Encounter: Payer: Self-pay | Admitting: Hematology and Oncology

## 2024-01-16 ENCOUNTER — Inpatient Hospital Stay: Payer: Medicare Other

## 2024-01-16 ENCOUNTER — Ambulatory Visit
Admission: RE | Admit: 2024-01-16 | Discharge: 2024-01-16 | Disposition: A | Discharge status: Home / Self Care | Payer: Medicare Other | Source: Ambulatory Visit | Attending: Radiation Oncology | Admitting: Radiation Oncology

## 2024-01-16 VITALS — BP 118/78 | HR 74 | Temp 97.7°F | Resp 17

## 2024-01-16 DIAGNOSIS — Z510 Encounter for antineoplastic radiation therapy: Secondary | ICD-10-CM | POA: Diagnosis not present

## 2024-01-16 DIAGNOSIS — C530 Malignant neoplasm of endocervix: Secondary | ICD-10-CM

## 2024-01-16 DIAGNOSIS — Z5111 Encounter for antineoplastic chemotherapy: Secondary | ICD-10-CM | POA: Diagnosis not present

## 2024-01-16 DIAGNOSIS — R197 Diarrhea, unspecified: Secondary | ICD-10-CM | POA: Insufficient documentation

## 2024-01-16 DIAGNOSIS — R300 Dysuria: Secondary | ICD-10-CM | POA: Diagnosis not present

## 2024-01-16 DIAGNOSIS — H9313 Tinnitus, bilateral: Secondary | ICD-10-CM | POA: Insufficient documentation

## 2024-01-16 LAB — RAD ONC ARIA SESSION SUMMARY
Course Elapsed Days: 36
Plan Fractions Treated to Date: 2
Plan Prescribed Dose Per Fraction: 1.8 Gy
Plan Total Fractions Prescribed: 5
Plan Total Prescribed Dose: 9 Gy
Reference Point Dosage Given to Date: 3.6 Gy
Reference Point Session Dosage Given: 1.8 Gy
Session Number: 27

## 2024-01-16 MED ORDER — DEXAMETHASONE SODIUM PHOSPHATE 10 MG/ML IJ SOLN
10.0000 mg | Freq: Once | INTRAMUSCULAR | Status: AC
  Administered 2024-01-16: 10 mg via INTRAVENOUS
  Filled 2024-01-16: qty 1

## 2024-01-16 MED ORDER — HEPARIN SOD (PORK) LOCK FLUSH 100 UNIT/ML IV SOLN
500.0000 [IU] | Freq: Once | INTRAVENOUS | Status: AC | PRN
Start: 2024-01-16 — End: 2024-01-16
  Administered 2024-01-16: 500 [IU]

## 2024-01-16 MED ORDER — SODIUM CHLORIDE 0.9% FLUSH
10.0000 mL | INTRAVENOUS | Status: DC | PRN
  Administered 2024-01-16: 10 mL

## 2024-01-16 MED ORDER — POTASSIUM CHLORIDE IN NACL 20-0.9 MEQ/L-% IV SOLN
Freq: Once | INTRAVENOUS | Status: AC
  Filled 2024-01-16: qty 1000

## 2024-01-16 MED ORDER — SODIUM CHLORIDE 0.9 % IV SOLN: INTRAVENOUS | Status: DC

## 2024-01-16 MED ORDER — MAGNESIUM SULFATE 2 GM/50ML IV SOLN
2.0000 g | Freq: Once | INTRAVENOUS | Status: AC
Start: 2024-01-16 — End: 2024-01-16
  Administered 2024-01-16: 2 g via INTRAVENOUS
  Filled 2024-01-16: qty 50

## 2024-01-16 MED ORDER — FAMOTIDINE 20 MG PO TABS
20.0000 mg | ORAL_TABLET | Freq: Once | ORAL | Status: AC
  Administered 2024-01-16: 20 mg via ORAL
  Filled 2024-01-16: qty 1

## 2024-01-16 MED ORDER — PALONOSETRON HCL INJECTION 0.25 MG/5ML
0.2500 mg | Freq: Once | INTRAVENOUS | Status: AC
  Administered 2024-01-16: 0.25 mg via INTRAVENOUS
  Filled 2024-01-16: qty 5

## 2024-01-16 MED ORDER — SODIUM CHLORIDE 0.9 % IV SOLN
40.0000 mg/m2 | Freq: Once | INTRAVENOUS | Status: AC
  Administered 2024-01-16: 60 mg via INTRAVENOUS
  Filled 2024-01-16: qty 60

## 2024-01-16 NOTE — Assessment & Plan Note (Addendum)
 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

## 2024-01-16 NOTE — Assessment & Plan Note (Addendum)
 She had daily nausea but much improved with addition of steroids I recommend prednisone for 3 days after chemotherapy along with antiemetics as needed Will reassess next week

## 2024-01-16 NOTE — Assessment & Plan Note (Addendum)
 Likely due to side effects of radiation Monitor closely and she will continue adequate hydration with Imodium

## 2024-01-16 NOTE — Assessment & Plan Note (Addendum)
 This is limited due to side effects of cisplatin Monitor closely

## 2024-01-16 NOTE — Assessment & Plan Note (Addendum)
 Due to recent treatment Monitor closely

## 2024-01-16 NOTE — Progress Notes (Signed)
 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 She is receiving concurrent chemoradiation therapy with curative intent Recent treatment was complicated by very mild tinnitus, nausea, diarrhea and mild pancytopenia We will proceed with treatment without delay Bilateral tinnitus This is limited due to side effects of cisplatin Monitor closely Nausea without vomiting She had daily nausea but much improved with addition of steroids I recommend prednisone for 3 days after chemotherapy along with antiemetics as needed Will reassess next week Pancytopenia, acquired (HCC) Due to recent treatment Monitor closely Diarrhea, unspecified type Likely due to side effects of radiation Monitor closely and she will continue adequate hydration with Imodium  No orders of the defined types were placed in this encounter.    Artis Delay, MD  INTERVAL HISTORY: she returns for treatment follow-up Complications related to previous cycle of chemotherapy included pancytopenia,, nausea with/without vomiting,, diarrhea,, and tinnitus She denies peripheral neuropathy  PHYSICAL EXAMINATION: ECOG PERFORMANCE STATUS: 1 - Symptomatic but completely ambulatory  Vitals:   01/15/24 0847  BP: 110/65  Pulse: 78  Resp: 18  Temp: 98.5 F (36.9 C)  SpO2: 100%   Filed Weights   01/15/24 0847  Weight: 119 lb 12.8 oz (54.3 kg)    Relevant data reviewed during this visit included CBC, BMP and magnesium

## 2024-01-16 NOTE — Patient Instructions (Signed)

## 2024-01-18 NOTE — Patient Instructions (Signed)
 SURGICAL WAITING ROOM VISITATION Patients having surgery or a procedure may have no more than 2 support people in the waiting area - these visitors may rotate in the visitor waiting room.   Due to an increase in RSV and influenza rates and associated hospitalizations, children ages 75 and under may not visit patients in [facility] hospitals. If the patient needs to stay at the hospital during part of their recovery, the visitor guidelines for inpatient rooms apply.  PRE-OP VISITATION  Pre-op nurse will coordinate an appropriate time for 1 support person to accompany the patient in pre-op.  This support person may not rotate.  This visitor will be contacted when the time is appropriate for the visitor to come back in the pre-op area.  Please refer to the [facility] website for the visitor guidelines for Inpatients (after your surgery is over and you are in a regular room).  You are not required to quarantine at this time prior to your surgery. However, you must do this: Hand Hygiene often Do NOT share personal items Notify your provider if you are in close contact with someone who has COVID or you develop fever 100.4 or greater, new onset of sneezing, cough, sore throat, shortness of breath or body aches.  If you test positive for Covid or have been in contact with anyone that has tested positive in the last 10 days please notify you surgeon.    Your procedure is scheduled on:  MONDAY  January 26, 2024  Report to [facility] Main Entrance: [address] entrance where the [employer] is available.   Report to admitting at:05:15 AM  Call this number if you have any questions or problems the morning of surgery [phone]  DO NOT EAT OR DRINK ANYTHING AFTER MIDNIGHT THE NIGHT PRIOR TO YOUR SURGERY / PROCEDURE.   FOLLOW  ANY ADDITIONAL PRE OP INSTRUCTIONS YOU RECEIVED FROM YOUR SURGEON'S OFFICE!!!   Oral Hygiene is also important to reduce your risk of infection.        Remember -  BRUSH YOUR TEETH THE MORNING OF SURGERY WITH YOUR REGULAR TOOTHPASTE  Do NOT smoke after Midnight the night before surgery.  STOP TAKING all Vitamins, Herbs and supplements 1 week before your surgery.   Take ONLY these medicines the morning of surgery with A SIP OF WATER:   You may take Percocet if needed.  You may not have any metal on your body including hair pins, jewelry, and body piercing  Do not wear make-up, lotions, powders, perfumes  or deodorant  Do not wear nail polish including gel and S&S, artificial / acrylic nails, or any other type of covering on natural nails including finger and toenails. If you have artificial nails, gel coating, etc., that needs to be removed by a nail salon, Please have this removed prior to surgery. Not doing so may mean that your surgery could be cancelled or delayed if the Surgeon or anesthesia staff feels like they are unable to monitor you safely.   Do not shave 48 hours prior to surgery to avoid nicks in your skin which may contribute to postoperative infections.   Contacts, Hearing Aids, dentures or bridgework may not be worn into surgery. DENTURES WILL BE REMOVED PRIOR TO SURGERY PLEASE DO NOT APPLY "Poly grip" OR ADHESIVES!!!  Patients discharged on the day of surgery will not be allowed to drive home.  Someone NEEDS to stay with you for the first 24 hours after anesthesia.  Do not bring  your home medications to the hospital. The Pharmacy will dispense medications listed on your medication list to you during your admission in the Hospital.  Please read over the following fact sheets you were given: IF YOU HAVE QUESTIONS ABOUT YOUR PRE-OP INSTRUCTIONS, PLEASE CALL [phone].   [place] - Preparing for Surgery Before surgery, you can play an important role.  Because skin is not sterile, your skin needs to be as free of germs as possible.  You can reduce the number of germs on your skin by washing with CHG (chlorahexidine gluconate) soap  before surgery.  CHG is an antiseptic cleaner which kills germs and bonds with the skin to continue killing germs even after washing. Please DO NOT use if you have an allergy to CHG or antibacterial soaps.  If your skin becomes reddened/irritated stop using the CHG and inform your nurse when you arrive at Short Stay. Do not shave (including legs and underarms) for at least 48 hours prior to the first CHG shower.  You may shave your face/neck.  Please follow these instructions carefully:  1.  Shower with CHG Soap the night before surgery and the  morning of surgery.  2.  If you choose to wash your hair, wash your hair first as usual with your normal  shampoo.  3.  After you shampoo, rinse your hair and body thoroughly to remove the shampoo.                             4.  Use CHG as you would any other liquid soap.  You can apply chg directly to the skin and wash.  Gently with a scrungie or clean washcloth.  5.  Apply the CHG Soap to your body ONLY FROM THE NECK DOWN.   Do not use on face/ open                           Wound or open sores. Avoid contact with eyes, ears mouth and genitals (private parts).                       Wash face,  Genitals (private parts) with your normal soap.             6.  Wash thoroughly, paying special attention to the area where your  surgery  will be performed.  7.  Thoroughly rinse your body with warm water from the neck down.  8.  DO NOT shower/wash with your normal soap after using and rinsing off the CHG Soap.            9.  Pat yourself dry with a clean towel.            10.  Wear clean pajamas.            11.  Place clean sheets on your bed the night of your first shower and do not  sleep with pets.  ON THE DAY OF SURGERY : Do not apply any lotions/deodorants the morning of surgery.  Please wear clean clothes to the hospital/surgery center.    FAILURE TO FOLLOW THESE INSTRUCTIONS MAY RESULT IN THE CANCELLATION OF YOUR SURGERY  PATIENT  SIGNATURE_________________________________  NURSE SIGNATURE__________________________________  ________________________________________________________________________

## 2024-01-18 NOTE — Progress Notes (Signed)
 COVID Vaccine received:  []  No [x]  Yes Date of any COVID positive Test in last 90 days: None  PCP - Benita Stabile, MD  Cardiologist - none Oncology-  Artis Delay, MD   Chest x-ray -  EKG - 01-20-2024  Epic Stress Test -  ECHO -  Cardiac Cath -   PCR screen: []  Ordered & Completed []   No Order but Needs PROFEND     [x]   N/A for this surgery  Surgery Plan:  [x]  Ambulatory   []  Outpatient in bed  []  Admit Anesthesia:    [x]  General  []  Spinal  []   Choice []   MAC  Pacemaker / ICD device [x]  No []  Yes   Spinal Cord Stimulator:[x]  No []  Yes       History of Sleep Apnea? [x]  No []  Yes   CPAP used?- [x]  No []  Yes    Does the patient monitor blood sugar?   [x]  N/A   []  No []  Yes  Patient has: [x]  NO Hx DM   []  Pre-DM   []  DM1  []   DM2  Blood Thinner / Instructions:   none Aspirin Instructions:  none  ERAS Protocol Ordered: [x]  No  []  Yes Patient is to be NPO after: midnight prior  Dental hx: []  Dentures:  []  N/A      [x]  Bridge or Partial: top and bottom                  []  Loose or Damaged teeth:   Activity level: Patient is able to climb a flight of stairs without difficulty; [x]  No CP  [x]  No SOB Patient can  perform ADLs without assistance.   Anesthesia review: Port-a-cath right internal jugular, HLD, pancytopenia.  Current chemo/radiation tx  Patient denies shortness of breath, fever, cough and chest pain at PAT appointment.  Patient verbalized understanding and agreement to the Pre-Surgical Instructions that were given to them at this PAT appointment. Patient was also educated of the need to review these PAT instructions again prior to his/her surgery.I reviewed the appropriate phone numbers to call if they have any and questions or concerns.

## 2024-01-19 ENCOUNTER — Encounter: Payer: Self-pay | Admitting: Gynecologic Oncology

## 2024-01-19 ENCOUNTER — Ambulatory Visit: Payer: Medicare Other

## 2024-01-19 ENCOUNTER — Other Ambulatory Visit: Payer: Self-pay

## 2024-01-19 ENCOUNTER — Ambulatory Visit
Admission: RE | Admit: 2024-01-19 | Discharge: 2024-01-19 | Disposition: A | Discharge status: Home / Self Care | Payer: Medicare Other | Source: Ambulatory Visit | Attending: Radiation Oncology | Admitting: Radiation Oncology

## 2024-01-19 DIAGNOSIS — L270 Generalized skin eruption due to drugs and medicaments taken internally: Secondary | ICD-10-CM | POA: Diagnosis not present

## 2024-01-19 DIAGNOSIS — Z01818 Encounter for other preprocedural examination: Secondary | ICD-10-CM | POA: Diagnosis not present

## 2024-01-19 DIAGNOSIS — Z95828 Presence of other vascular implants and grafts: Secondary | ICD-10-CM | POA: Insufficient documentation

## 2024-01-19 DIAGNOSIS — C538 Malignant neoplasm of overlapping sites of cervix uteri: Secondary | ICD-10-CM | POA: Insufficient documentation

## 2024-01-19 DIAGNOSIS — C530 Malignant neoplasm of endocervix: Secondary | ICD-10-CM | POA: Insufficient documentation

## 2024-01-19 DIAGNOSIS — R634 Abnormal weight loss: Secondary | ICD-10-CM | POA: Diagnosis not present

## 2024-01-19 DIAGNOSIS — R300 Dysuria: Secondary | ICD-10-CM | POA: Diagnosis not present

## 2024-01-19 DIAGNOSIS — D61818 Other pancytopenia: Secondary | ICD-10-CM | POA: Diagnosis not present

## 2024-01-19 DIAGNOSIS — E785 Hyperlipidemia, unspecified: Secondary | ICD-10-CM | POA: Diagnosis not present

## 2024-01-19 LAB — RAD ONC ARIA SESSION SUMMARY
Course Elapsed Days: 39
Plan Fractions Treated to Date: 3
Plan Prescribed Dose Per Fraction: 1.8 Gy
Plan Total Fractions Prescribed: 5
Plan Total Prescribed Dose: 9 Gy
Reference Point Dosage Given to Date: 5.4 Gy
Reference Point Session Dosage Given: 1.8 Gy
Session Number: 28

## 2024-01-20 ENCOUNTER — Encounter (HOSPITAL_COMMUNITY): Payer: Self-pay

## 2024-01-20 ENCOUNTER — Other Ambulatory Visit: Payer: Self-pay

## 2024-01-20 ENCOUNTER — Ambulatory Visit: Payer: Medicare Other

## 2024-01-20 ENCOUNTER — Encounter (HOSPITAL_COMMUNITY)
Admission: RE | Admit: 2024-01-20 | Discharge: 2024-01-20 | Disposition: A | Discharge status: Home / Self Care | Payer: Medicare Other | Source: Ambulatory Visit | Attending: Radiation Oncology | Admitting: Radiation Oncology

## 2024-01-20 ENCOUNTER — Telehealth: Payer: Self-pay

## 2024-01-20 ENCOUNTER — Ambulatory Visit
Admission: RE | Admit: 2024-01-20 | Discharge: 2024-01-20 | Disposition: A | Discharge status: Home / Self Care | Payer: Medicare Other | Source: Ambulatory Visit | Attending: Radiation Oncology | Admitting: Radiation Oncology

## 2024-01-20 ENCOUNTER — Ambulatory Visit
Admission: RE | Admit: 2024-01-20 | Discharge: 2024-01-20 | Disposition: A | Discharge status: Home / Self Care | Source: Ambulatory Visit | Attending: Radiation Oncology | Admitting: Radiation Oncology

## 2024-01-20 VITALS — BP 117/67 | HR 80 | Temp 98.8°F | Resp 12 | Ht 60.0 in | Wt 112.0 lb

## 2024-01-20 DIAGNOSIS — C538 Malignant neoplasm of overlapping sites of cervix uteri: Secondary | ICD-10-CM

## 2024-01-20 DIAGNOSIS — L270 Generalized skin eruption due to drugs and medicaments taken internally: Secondary | ICD-10-CM

## 2024-01-20 DIAGNOSIS — Z01818 Encounter for other preprocedural examination: Secondary | ICD-10-CM | POA: Diagnosis not present

## 2024-01-20 DIAGNOSIS — C530 Malignant neoplasm of endocervix: Secondary | ICD-10-CM

## 2024-01-20 DIAGNOSIS — D61818 Other pancytopenia: Secondary | ICD-10-CM

## 2024-01-20 DIAGNOSIS — R634 Abnormal weight loss: Secondary | ICD-10-CM

## 2024-01-20 DIAGNOSIS — Z95828 Presence of other vascular implants and grafts: Secondary | ICD-10-CM | POA: Diagnosis not present

## 2024-01-20 DIAGNOSIS — E785 Hyperlipidemia, unspecified: Secondary | ICD-10-CM

## 2024-01-20 DIAGNOSIS — R300 Dysuria: Secondary | ICD-10-CM | POA: Diagnosis not present

## 2024-01-20 HISTORY — DX: Malignant (primary) neoplasm, unspecified: C80.1

## 2024-01-20 HISTORY — DX: Depression, unspecified: F32.A

## 2024-01-20 HISTORY — DX: Anxiety disorder, unspecified: F41.9

## 2024-01-20 HISTORY — DX: Disease of blood and blood-forming organs, unspecified: D75.9

## 2024-01-20 LAB — DIFFERENTIAL
Abs Immature Granulocytes: 0 10*3/uL (ref 0.00–0.07)
Basophils Absolute: 0 10*3/uL (ref 0.0–0.1)
Basophils Relative: 1 %
Eosinophils Absolute: 0.1 10*3/uL (ref 0.0–0.5)
Eosinophils Relative: 3 %
Immature Granulocytes: 0 %
Lymphocytes Relative: 9 %
Lymphs Abs: 0.2 10*3/uL — ABNORMAL LOW (ref 0.7–4.0)
Monocytes Absolute: 0.3 10*3/uL (ref 0.1–1.0)
Monocytes Relative: 11 %
Neutro Abs: 1.9 10*3/uL (ref 1.7–7.7)
Neutrophils Relative %: 76 %

## 2024-01-20 LAB — RAD ONC ARIA SESSION SUMMARY
Course Elapsed Days: 40
Plan Fractions Treated to Date: 4
Plan Prescribed Dose Per Fraction: 1.8 Gy
Plan Total Fractions Prescribed: 5
Plan Total Prescribed Dose: 9 Gy
Reference Point Dosage Given to Date: 7.2 Gy
Reference Point Session Dosage Given: 1.8 Gy
Session Number: 29

## 2024-01-20 LAB — CBC
HCT: 35.4 % — ABNORMAL LOW (ref 36.0–46.0)
Hemoglobin: 11.7 g/dL — ABNORMAL LOW (ref 12.0–15.0)
MCH: 32.1 pg (ref 26.0–34.0)
MCHC: 33.1 g/dL (ref 30.0–36.0)
MCV: 97.3 fL (ref 80.0–100.0)
Platelets: 142 10*3/uL — ABNORMAL LOW (ref 150–400)
RBC: 3.64 MIL/uL — ABNORMAL LOW (ref 3.87–5.11)
RDW: 15.4 % (ref 11.5–15.5)
WBC: 2.4 10*3/uL — ABNORMAL LOW (ref 4.0–10.5)
nRBC: 0 % (ref 0.0–0.2)

## 2024-01-20 LAB — URINALYSIS, COMPLETE (UACMP) WITH MICROSCOPIC
Bilirubin Urine: NEGATIVE
Glucose, UA: NEGATIVE mg/dL
Ketones, ur: 5 mg/dL — AB
Nitrite: NEGATIVE
Protein, ur: NEGATIVE mg/dL
Specific Gravity, Urine: 1.006 (ref 1.005–1.030)
pH: 5 (ref 5.0–8.0)

## 2024-01-20 LAB — BASIC METABOLIC PANEL
Anion gap: 8 (ref 5–15)
BUN: 12 mg/dL (ref 8–23)
CO2: 26 mmol/L (ref 22–32)
Calcium: 9.1 mg/dL (ref 8.9–10.3)
Chloride: 99 mmol/L (ref 98–111)
Creatinine, Ser: 0.77 mg/dL (ref 0.44–1.00)
GFR, Estimated: >60 mL/min (ref >60)
Glucose, Bld: 105 mg/dL — ABNORMAL HIGH (ref 70–99)
Potassium: 3.6 mmol/L (ref 3.5–5.1)
Sodium: 133 mmol/L — ABNORMAL LOW (ref 135–145)

## 2024-01-20 NOTE — Telephone Encounter (Signed)
 Called and told her lab appt canceled for tomorrow since she had labs today. She verbalized understanding.

## 2024-01-21 ENCOUNTER — Ambulatory Visit: Payer: Medicare Other

## 2024-01-21 ENCOUNTER — Ambulatory Visit
Admission: RE | Admit: 2024-01-21 | Discharge: 2024-01-21 | Disposition: A | Discharge status: Home / Self Care | Payer: Medicare Other | Source: Ambulatory Visit | Attending: Radiation Oncology | Admitting: Radiation Oncology

## 2024-01-21 ENCOUNTER — Inpatient Hospital Stay: Payer: Medicare Other

## 2024-01-21 ENCOUNTER — Other Ambulatory Visit: Payer: Self-pay

## 2024-01-21 DIAGNOSIS — R634 Abnormal weight loss: Secondary | ICD-10-CM | POA: Diagnosis not present

## 2024-01-21 DIAGNOSIS — E785 Hyperlipidemia, unspecified: Secondary | ICD-10-CM | POA: Diagnosis not present

## 2024-01-21 DIAGNOSIS — L270 Generalized skin eruption due to drugs and medicaments taken internally: Secondary | ICD-10-CM | POA: Diagnosis not present

## 2024-01-21 DIAGNOSIS — Z01818 Encounter for other preprocedural examination: Secondary | ICD-10-CM | POA: Diagnosis not present

## 2024-01-21 DIAGNOSIS — Z510 Encounter for antineoplastic radiation therapy: Secondary | ICD-10-CM | POA: Diagnosis not present

## 2024-01-21 DIAGNOSIS — R300 Dysuria: Secondary | ICD-10-CM | POA: Diagnosis not present

## 2024-01-21 DIAGNOSIS — Z95828 Presence of other vascular implants and grafts: Secondary | ICD-10-CM | POA: Diagnosis not present

## 2024-01-21 DIAGNOSIS — C530 Malignant neoplasm of endocervix: Secondary | ICD-10-CM | POA: Diagnosis not present

## 2024-01-21 DIAGNOSIS — D61818 Other pancytopenia: Secondary | ICD-10-CM | POA: Diagnosis not present

## 2024-01-21 LAB — URINE CULTURE: Culture: <10000 — AB

## 2024-01-21 LAB — RAD ONC ARIA SESSION SUMMARY
Course Elapsed Days: 41
Plan Fractions Treated to Date: 5
Plan Prescribed Dose Per Fraction: 1.8 Gy
Plan Total Fractions Prescribed: 5
Plan Total Prescribed Dose: 9 Gy
Reference Point Dosage Given to Date: 9 Gy
Reference Point Session Dosage Given: 1.8 Gy
Session Number: 30

## 2024-01-22 ENCOUNTER — Ambulatory Visit: Payer: Medicare Other

## 2024-01-22 ENCOUNTER — Encounter: Payer: Self-pay | Admitting: Hematology and Oncology

## 2024-01-22 ENCOUNTER — Inpatient Hospital Stay: Payer: Medicare Other | Attending: Gynecologic Oncology | Admitting: Hematology and Oncology

## 2024-01-22 VITALS — BP 113/66 | HR 86 | Resp 18 | Ht 60.0 in | Wt 116.8 lb

## 2024-01-22 DIAGNOSIS — C530 Malignant neoplasm of endocervix: Secondary | ICD-10-CM | POA: Insufficient documentation

## 2024-01-22 DIAGNOSIS — R634 Abnormal weight loss: Secondary | ICD-10-CM | POA: Diagnosis not present

## 2024-01-22 DIAGNOSIS — R110 Nausea: Secondary | ICD-10-CM | POA: Diagnosis not present

## 2024-01-22 DIAGNOSIS — H9319 Tinnitus, unspecified ear: Secondary | ICD-10-CM | POA: Insufficient documentation

## 2024-01-22 DIAGNOSIS — D61818 Other pancytopenia: Secondary | ICD-10-CM | POA: Insufficient documentation

## 2024-01-22 NOTE — Assessment & Plan Note (Addendum)
 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

## 2024-01-22 NOTE — Assessment & Plan Note (Addendum)
 We discussed importance of frequent small meals and high-protein diet

## 2024-01-22 NOTE — Progress Notes (Signed)
 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%   She has mild progressive worsening pancytopenia and weight loss She also have residual tinnitus and nausea I recommend discontinuation of chemotherapy after 5 doses She will continue radiation treatment I will see her in 8 weeks for further follow-up I will order repeat imaging study after completion of radiation therapy, approximately 3 months from her last dose of radiation Pancytopenia, acquired (HCC) Due to recent treatment Monitor closely Weight loss, non-intentional We discussed importance of frequent small meals and high-protein diet Nausea without vomiting She will continue antiemetics as needed  Orders Placed This Encounter  Procedures   Comprehensive metabolic panel    Standing Status:   Standing    Number of Occurrences:   33    Expiration Date:   01/21/2025   CBC with Differential/Platelet    Standing Status:   Standing    Number of Occurrences:   22    Expiration Date:   01/21/2025   Magnesium    Standing Status:   Future    Expiration Date:   01/21/2025     Artis Delay, MD  INTERVAL HISTORY: she returns for treatment follow-up Complications related to previous cycle of chemotherapy included pancytopenia,, nausea with/without vomiting,, and weight loss,  PHYSICAL EXAMINATION: ECOG PERFORMANCE STATUS: 1 - Symptomatic but completely ambulatory  Vitals:   01/22/24 0843  BP: 113/66  Pulse: 86  Resp: 18  SpO2: 100%   Filed Weights   01/22/24 0843  Weight: 116 lb 12.8 oz (53 kg)    Relevant data reviewed during this visit included CBC and BMP

## 2024-01-22 NOTE — Assessment & Plan Note (Addendum)
 Due to recent treatment Monitor closely

## 2024-01-22 NOTE — Assessment & Plan Note (Addendum)
She will continue antiemetics as needed

## 2024-01-22 NOTE — Radiation Completion Notes (Signed)
 Patient Name: Kelly Thomas, Kelly Thomas MRN: [id number] Date of Birth: Jun 06, 1956 Referring Physician: Eugene Garnet, M.D. Date of Service: 2024-01-22 Radiation Oncologist: Arnette Schaumann, M.D. [facility] [place]                             RADIATION ONCOLOGY END OF TREATMENT NOTE     Diagnosis: C53.0 Malignant neoplasm of endocervix Staging on 2023-12-01: Cervical cancer (HCC) T=cT2b, N=cN0, M=cM0 Intent: Curative     ==========DELIVERED PLANS==========  First Treatment Date: 2023-12-11 Last Treatment Date: 2024-01-21   Plan Name: Pelvis Site: Pelvis Technique: 3D Mode: Photon Dose Per Fraction: 1.8 Gy Prescribed Dose (Delivered / Prescribed): 9 Gy / 9 Gy Prescribed Fxs (Delivered / Prescribed): 5 / 5   Plan Name: Pelvis_Bst Site: Pelvis Technique: IMRT Mode: Photon Dose Per Fraction: 1.8 Gy Prescribed Dose (Delivered / Prescribed): 36 Gy / 36 Gy Prescribed Fxs (Delivered / Prescribed): 20 / 20   Plan Name: Pelvis_Bst2 Site: Pelvis Technique: 3D Mode: Photon Dose Per Fraction: 1.8 Gy Prescribed Dose (Delivered / Prescribed): 9 Gy / 9 Gy Prescribed Fxs (Delivered / Prescribed): 5 / 5     ==========ON TREATMENT VISIT DATES========== 2023-12-16, 2023-12-23, 2023-12-30, 2024-01-06, 2024-01-13, 2024-01-20     ==========UPCOMING VISITS========== 03/18/2024 [facility] ONCOLOGY EST PT 20 Artis Delay, MD  03/18/2024 [facility] PORT FLUSH W/LAB [facility] FLUSH  02/03/2024 [facility] INJECTION [facility] NURSE  02/03/2024 [facility] ON[facility] HDR TREATMENT Antony Blackbird, MD  02/03/2024 [facility] ONC CT SIMULATION Antony Blackbird, MD  01/26/2024 [facility] INJECTION [facility] NURSE  01/26/2024 [facility] ON[facility] HDR TREATMENT Antony Blackbird, MD  01/26/2024 [facility] ONC CT SIMULATION Antony Blackbird, MD  01/26/2024 [facility]ULTRASOUND [place] INTRAOPERATIVE [facility]-US 1  01/22/2024 [facility] ONCOLOGY EST PT 20 Artis Delay,  MD        ==========APPENDIX - ON TREATMENT VISIT NOTES==========   See weekly On Treatment Notes in Epic for details in the Media tab (listed as Progress notes on the On Treatment Visit Dates listed above).

## 2024-01-23 ENCOUNTER — Ambulatory Visit: Payer: Medicare Other

## 2024-01-23 ENCOUNTER — Inpatient Hospital Stay: Payer: Medicare Other

## 2024-01-23 NOTE — Anesthesia Preprocedure Evaluation (Addendum)
 Anesthesia Evaluation  Patient identified by MRN, date of birth, ID band Patient awake    Reviewed: Allergy & Precautions, NPO status , Patient's Chart, lab work & pertinent test results  Airway Mallampati: III  TM Distance: >3 FB Neck ROM: Full    Dental  (+) Partial Lower, Partial Upper, Dental Advisory Given   Pulmonary neg pulmonary ROS   Pulmonary exam normal breath sounds clear to auscultation       Cardiovascular negative cardio ROS Normal cardiovascular exam Rhythm:Regular Rate:Normal     Neuro/Psych  PSYCHIATRIC DISORDERS Anxiety Depression    negative neurological ROS     GI/Hepatic negative GI ROS, Neg liver ROS,,,  Endo/Other  negative endocrine ROS    Renal/GU negative Renal ROS  negative genitourinary   Musculoskeletal negative musculoskeletal ROS (+)    Abdominal   Peds  Hematology  (+) Blood dyscrasia, anemia Hb 11.7, plt 142   Anesthesia Other Findings   Reproductive/Obstetrics Cervical ca                             Anesthesia Physical Anesthesia Plan  ASA: 2  Anesthesia Plan: General   Post-op Pain Management: Tylenol PO (pre-op)* and Toradol IV (intra-op)*   Induction: Intravenous  PONV Risk Score and Plan: 3 and Ondansetron, Dexamethasone, Midazolam and Treatment may vary due to age or medical condition  Airway Management Planned: LMA  Additional Equipment: None  Intra-op Plan:   Post-operative Plan: Extubation in OR  Informed Consent: I have reviewed the patients History and Physical, chart, labs and discussed the procedure including the risks, benefits and alternatives for the proposed anesthesia with the patient or authorized representative who has indicated his/her understanding and acceptance.     Dental advisory given  Plan Discussed with: CRNA  Anesthesia Plan Comments:        Anesthesia Quick Evaluation

## 2024-01-24 NOTE — Progress Notes (Signed)
  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 Identity was confirmed. All relevant records and images related to the planned course of therapy were reviewed. The patient freely provided informed written consent to proceed with treatment after reviewing the details related to the planned course of therapy. The consent form was witnessed and verified by the simulation staff. Then, the patient was set-up in a stable reproducible supine position for radiation therapy. The tandem ring system was accessed and fiducial markers were placed within the tandem and ring.   Simple treatment device note: On the operating room the patient had construction of her custom tandem ring system. She will be treated with a 45 tandem/ring system. The patient had placement of a 60 mm tandem. A cervical ring with a small shielding was used for her treatment. A rectal paddle was also part of her custom set up device.  Verification simulation note: An AP and lateral film was obtained through the pelvis area. This was compared to the patient's planning films documenting accurate position of the tandem/ring system for treatment.  High-dose-rate brachytherapy treatment note:   The remote afterloading device was accessed through catheter system and attached to the tandem ring system. Patient then proceeded to undergo her first high-dose-rate treatment directed at the cervix. The patient was prescribed a dose of 5.5 gray to be delivered to  the HRCTV.Marland Kitchen Patient was treated with 2 channels using 27 dwell positions. Treatment time was 470.9 seconds. The patient tolerated the procedure well. After completion of her therapy, a radiation survey was performed documenting return of the iridium source into the Gamma[employer] safe. The patient was then transferred to the nursing suite.  She then had removal of the rectal paddle followed by the tandem and ring system. The patient tolerated the removal well.  PLAN: The patient will return next week for her second high-dose-rate treatment.  ________________________________   -----------------------------------  Billie Lade, PhD, MD  This document serves as a record of services personally performed by Antony Blackbird, MD. It was created on his behalf by Neena Rhymes, a trained medical scribe. The creation of this record is based on the scribe's personal observations and the provider's statements to them. This document has been checked and approved by the attending provider.

## 2024-01-25 NOTE — H&P (View-Only) (Signed)
 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  FIGO Stage IIB (cT2b, cN0, cM0)   Cancer Staging  Cervical cancer (HCC) Staging form: Cervix Uteri, AJCC Version 9 - Clinical stage from 12/01/2023: FIGO Stage IIB (cT2b, cN0, cM0) - Signed by Artis Delay, MD on 12/01/2023   HISTORY OF PRESENT ILLNESS::Kelly Thomas is a 68 y.o. female who recently completed external beam and chemotherapy. She is now ready to proceed with brachytherapy to complete her definitive course of treatment. The patient was referred to Dr. Eugene Garnet for a cervical biopsy done on 11/07/2023 that revealed poorly differentiated invasive squamous cell carcinoma. Patient met with Dr. Pricilla Holm on 11/21/23 and at that time she reported abnormal bleeding and discharge. She was also experiencing infrequent small amount of runny dark brown discharge that occurred throughout October and November. She then had a heavy bleeding episode with passage of clots that were the size of golf balls in December. She also reported experiencing some sharp suprapubic pain, as if she had a urinary tract infection which resolved without any intervention. On exam, she had a friable tumor replacing basically the entire face of her cervix. The cervix itself was enlarged and barrel-shaped. There was no vaginal involvement, but there was some mild thickening of the parametria.    Patient proceeded with a PET scan done on 12/01/23 which showed a hypermetabolic cervical mass measuring 3.8 cm x 4.6 cm at the greatest extent, compatible with the provided history of cervical cancer. No evidence of metastatic disease was indicated. Cervical biopsy was reviewed at that time. Biopsy revealed a high-grade squamous cell carcinoma of the cervix. She  recommended MRI to help evaluate for findings concerning for local extension from physical exam (parametrial involvement ) this is scheduled for 12/05/2023.   She was seen by Dr. Artis Delay on 12/02/23. At that time she denied any symptoms and was feeling well overall. They discussed the role of chemotherapy before starting treatment.    PREVIOUS RADIATION THERAPY: No  PAST MEDICAL HISTORY:  Past Medical History:  Diagnosis Date   Anxiety    Blood dyscrasia    pantocytopenia   Cancer (HCC)    endocervical cancer   Depression    Hyperlipidemia     PAST SURGICAL HISTORY: Past Surgical History:  Procedure Laterality Date   CHOLECYSTECTOMY     COLONOSCOPY  04/02/2012   Procedure: COLONOSCOPY;  Surgeon: Corbin Ade, MD;  Location: [facility];  Service: Endoscopy;  Laterality: N/A;  11:30 AM   IR IMAGING GUIDED PORT INSERTION  12/11/2023    FAMILY HISTORY:  Family History  Problem Relation Age of Onset   Cancer Mother        Uterine/Endometrial   Endometrial cancer Mother    Cancer Brother        throat cancer   Throat cancer Brother    Ovarian cancer Neg Hx    Colon cancer Neg Hx    Pancreatic cancer Neg Hx    Breast cancer Neg Hx    Prostate cancer Neg Hx     SOCIAL HISTORY:  Social History   Tobacco Use   Smoking status: Never  Vaping Use   Vaping status: Never Used  Substance Use Topics   Alcohol use: No   Drug use:  Not Currently    ALLERGIES:  Allergies  Allergen Reactions   Emend [Fosaprepitant Dimeglumine] Rash    See progress note from 12/25/23    MEDICATIONS:  No current facility-administered medications for this encounter.   Current Outpatient Medications  Medication Sig Dispense Refill   atorvastatin (LIPITOR) 10 MG tablet Take 10 mg by mouth 3 (three) times a week.     calcium-vitamin D (OSCAL WITH D) 500-200 MG-UNIT per tablet Take 1 tablet by mouth daily.     Multiple Vitamins-Minerals (MULTIVITAMIN WOMEN PO) Take 1 tablet by mouth daily.      ondansetron (ZOFRAN) 8 MG tablet Take 1 tablet (8 mg total) by mouth every 8 (eight) hours as needed for nausea or vomiting. Start on the third day after cisplatin. 30 tablet 1   phenazopyridine (PYRIDIUM) 200 MG tablet Take 1 tablet (200 mg total) by mouth 3 (three) times daily as needed for pain. 20 tablet 1   prochlorperazine (COMPAZINE) 10 MG tablet Take 1 tablet (10 mg total) by mouth every 6 (six) hours as needed (Nausea or vomiting). 30 tablet 1   lidocaine-prilocaine (EMLA) cream Apply to affected area once (Patient not taking: Reported on 12/03/2023) 30 g 3   oxyCODONE-acetaminophen (PERCOCET/ROXICET) 5-325 MG tablet Take 1 tablet by mouth every 6 (six) hours as needed for severe pain (pain score 7-10). 20 tablet 0    REVIEW OF SYSTEMS:  vaginal bleeding has stopped.diarrhea manageable.   PHYSICAL EXAM:  General: Alert and oriented, in no acute distress HEENT: Head is normocephalic. Extraocular movements are intact. Oropharynx is clear. Neck: Neck is supple, no palpable cervical or supraclavicular lymphadenopathy. Heart: Regular in rate and rhythm with no murmurs, rubs, or gallops. Chest: Clear to auscultation bilaterally, with no rhonchi, wheezes, or rales. Abdomen: Soft, nontender, nondistended, with no rigidity or guarding. Extremities: No cyanosis or edema. Lymphatics: see Neck Exam Skin: No concerning lesions. Musculoskeletal: symmetric strength and muscle tone throughout. Neurologic: Cranial nerves II through XII are grossly intact. No obvious focalities. Speech is fluent. Coordination is intact. Psychiatric: Judgment and insight are intact. Affect is appropriate.  Pelvic exam to be performed in the OR  ECOG = 1    LABORATORY DATA:  Lab Results  Component Value Date   WBC 2.4 (L) 01/20/2024   HGB 11.7 (L) 01/20/2024   HCT 35.4 (L) 01/20/2024   MCV 97.3 01/20/2024   PLT 142 (L) 01/20/2024   NEUTROABS 1.9 01/20/2024   Lab Results  Component Value Date   NA 133  (L) 01/20/2024   K 3.6 01/20/2024   CL 99 01/20/2024   CO2 26 01/20/2024   GLUCOSE 105 (H) 01/20/2024   BUN 12 01/20/2024   CREATININE 0.77 01/20/2024   CALCIUM 9.1 01/20/2024      RADIOGRAPHY: No results found.     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  FIGO Stage IIB (cT2b, cN0, cM0)   Patient's case was discussed at our tumor board and the consensus was to proceed with concurrent chemoradiation treatment. If MRI does not shows local extension, the recommendation was to consider 1 fraction of  brachytherapy followed by surgery depending on the patient's response to her initial course of treatment..  If MRI shows parametrial extension then the patient will proceed with a definitive course of external beam and radiosensitizing chemotherapy followed by 5 brachytherapy procedures.    PLAN: She will be taken to the OR for EUA and placement of brachytherapy therapy equipment in preparation for  high dose rate radiation therapy. Iridium 192 will be the high dose rate source.     ------------------------------------------------  Billie Lade, MD

## 2024-01-25 NOTE — H&P (Signed)
 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  FIGO Stage IIB (cT2b, cN0, cM0)   Cancer Staging  Cervical cancer (HCC) Staging form: Cervix Uteri, AJCC Version 9 - Clinical stage from 12/01/2023: FIGO Stage IIB (cT2b, cN0, cM0) - Signed by Artis Delay, MD on 12/01/2023   HISTORY OF PRESENT ILLNESS::Kelly Thomas is a 68 y.o. female who recently completed external beam and chemotherapy. She is now ready to proceed with brachytherapy to complete her definitive course of treatment. The patient was referred to Dr. Eugene Garnet for a cervical biopsy done on 11/07/2023 that revealed poorly differentiated invasive squamous cell carcinoma. Patient met with Dr. Pricilla Holm on 11/21/23 and at that time she reported abnormal bleeding and discharge. She was also experiencing infrequent small amount of runny dark brown discharge that occurred throughout October and November. She then had a heavy bleeding episode with passage of clots that were the size of golf balls in December. She also reported experiencing some sharp suprapubic pain, as if she had a urinary tract infection which resolved without any intervention. On exam, she had a friable tumor replacing basically the entire face of her cervix. The cervix itself was enlarged and barrel-shaped. There was no vaginal involvement, but there was some mild thickening of the parametria.    Patient proceeded with a PET scan done on 12/01/23 which showed a hypermetabolic cervical mass measuring 3.8 cm x 4.6 cm at the greatest extent, compatible with the provided history of cervical cancer. No evidence of metastatic disease was indicated. Cervical biopsy was reviewed at that time. Biopsy revealed a high-grade squamous cell carcinoma of the cervix. She  recommended MRI to help evaluate for findings concerning for local extension from physical exam (parametrial involvement ) this is scheduled for 12/05/2023.   She was seen by Dr. Artis Delay on 12/02/23. At that time she denied any symptoms and was feeling well overall. They discussed the role of chemotherapy before starting treatment.    PREVIOUS RADIATION THERAPY: No  PAST MEDICAL HISTORY:  Past Medical History:  Diagnosis Date   Anxiety    Blood dyscrasia    pantocytopenia   Cancer (HCC)    endocervical cancer   Depression    Hyperlipidemia     PAST SURGICAL HISTORY: Past Surgical History:  Procedure Laterality Date   CHOLECYSTECTOMY     COLONOSCOPY  04/02/2012   Procedure: COLONOSCOPY;  Surgeon: Corbin Ade, MD;  Location: [facility];  Service: Endoscopy;  Laterality: N/A;  11:30 AM   IR IMAGING GUIDED PORT INSERTION  12/11/2023    FAMILY HISTORY:  Family History  Problem Relation Age of Onset   Cancer Mother        Uterine/Endometrial   Endometrial cancer Mother    Cancer Brother        throat cancer   Throat cancer Brother    Ovarian cancer Neg Hx    Colon cancer Neg Hx    Pancreatic cancer Neg Hx    Breast cancer Neg Hx    Prostate cancer Neg Hx     SOCIAL HISTORY:  Social History   Tobacco Use   Smoking status: Never  Vaping Use   Vaping status: Never Used  Substance Use Topics   Alcohol use: No   Drug use:  Not Currently    ALLERGIES:  Allergies  Allergen Reactions   Emend [Fosaprepitant Dimeglumine] Rash    See progress note from 12/25/23    MEDICATIONS:  No current facility-administered medications for this encounter.   Current Outpatient Medications  Medication Sig Dispense Refill   atorvastatin (LIPITOR) 10 MG tablet Take 10 mg by mouth 3 (three) times a week.     calcium-vitamin D (OSCAL WITH D) 500-200 MG-UNIT per tablet Take 1 tablet by mouth daily.     Multiple Vitamins-Minerals (MULTIVITAMIN WOMEN PO) Take 1 tablet by mouth daily.      ondansetron (ZOFRAN) 8 MG tablet Take 1 tablet (8 mg total) by mouth every 8 (eight) hours as needed for nausea or vomiting. Start on the third day after cisplatin. 30 tablet 1   phenazopyridine (PYRIDIUM) 200 MG tablet Take 1 tablet (200 mg total) by mouth 3 (three) times daily as needed for pain. 20 tablet 1   prochlorperazine (COMPAZINE) 10 MG tablet Take 1 tablet (10 mg total) by mouth every 6 (six) hours as needed (Nausea or vomiting). 30 tablet 1   lidocaine-prilocaine (EMLA) cream Apply to affected area once (Patient not taking: Reported on 12/03/2023) 30 g 3   oxyCODONE-acetaminophen (PERCOCET/ROXICET) 5-325 MG tablet Take 1 tablet by mouth every 6 (six) hours as needed for severe pain (pain score 7-10). 20 tablet 0    REVIEW OF SYSTEMS:  vaginal bleeding has stopped.diarrhea manageable.   PHYSICAL EXAM:  General: Alert and oriented, in no acute distress HEENT: Head is normocephalic. Extraocular movements are intact. Oropharynx is clear. Neck: Neck is supple, no palpable cervical or supraclavicular lymphadenopathy. Heart: Regular in rate and rhythm with no murmurs, rubs, or gallops. Chest: Clear to auscultation bilaterally, with no rhonchi, wheezes, or rales. Abdomen: Soft, nontender, nondistended, with no rigidity or guarding. Extremities: No cyanosis or edema. Lymphatics: see Neck Exam Skin: No concerning lesions. Musculoskeletal: symmetric strength and muscle tone throughout. Neurologic: Cranial nerves II through XII are grossly intact. No obvious focalities. Speech is fluent. Coordination is intact. Psychiatric: Judgment and insight are intact. Affect is appropriate.  Pelvic exam to be performed in the OR  ECOG = 1    LABORATORY DATA:  Lab Results  Component Value Date   WBC 2.4 (L) 01/20/2024   HGB 11.7 (L) 01/20/2024   HCT 35.4 (L) 01/20/2024   MCV 97.3 01/20/2024   PLT 142 (L) 01/20/2024   NEUTROABS 1.9 01/20/2024   Lab Results  Component Value Date   NA 133  (L) 01/20/2024   K 3.6 01/20/2024   CL 99 01/20/2024   CO2 26 01/20/2024   GLUCOSE 105 (H) 01/20/2024   BUN 12 01/20/2024   CREATININE 0.77 01/20/2024   CALCIUM 9.1 01/20/2024      RADIOGRAPHY: No results found.     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  FIGO Stage IIB (cT2b, cN0, cM0)   Patient's case was discussed at our tumor board and the consensus was to proceed with concurrent chemoradiation treatment. If MRI does not shows local extension, the recommendation was to consider 1 fraction of  brachytherapy followed by surgery depending on the patient's response to her initial course of treatment..  If MRI shows parametrial extension then the patient will proceed with a definitive course of external beam and radiosensitizing chemotherapy followed by 5 brachytherapy procedures.    PLAN: She will be taken to the OR for EUA and placement of brachytherapy therapy equipment in preparation for  high dose rate radiation therapy. Iridium 192 will be the high dose rate source.     ------------------------------------------------  Billie Lade, MD

## 2024-01-26 ENCOUNTER — Other Ambulatory Visit: Payer: Self-pay

## 2024-01-26 ENCOUNTER — Ambulatory Visit (HOSPITAL_COMMUNITY)
Admission: RE | Admit: 2024-01-26 | Discharge: 2024-01-26 | Disposition: A | Discharge status: Home / Self Care | Payer: Medicare Other | Source: Ambulatory Visit | Attending: Radiation Oncology | Admitting: Radiation Oncology

## 2024-01-26 ENCOUNTER — Ambulatory Visit (HOSPITAL_COMMUNITY)
Admission: RE | Admit: 2024-01-26 | Discharge: 2024-01-26 | Disposition: A | Discharge status: Home / Self Care | Payer: Medicare Other | Attending: Radiation Oncology | Admitting: Radiation Oncology

## 2024-01-26 ENCOUNTER — Ambulatory Visit (HOSPITAL_COMMUNITY): Payer: Self-pay | Admitting: Anesthesiology

## 2024-01-26 ENCOUNTER — Ambulatory Visit
Admission: RE | Admit: 2024-01-26 | Discharge: 2024-01-26 | Disposition: A | Discharge status: Home / Self Care | Payer: Medicare Other | Source: Ambulatory Visit | Attending: Hematology and Oncology | Admitting: Hematology and Oncology

## 2024-01-26 ENCOUNTER — Ambulatory Visit
Admission: RE | Admit: 2024-01-26 | Discharge: 2024-01-26 | Disposition: A | Discharge status: Home / Self Care | Payer: Medicare Other | Source: Ambulatory Visit | Attending: Radiation Oncology | Admitting: Radiation Oncology

## 2024-01-26 ENCOUNTER — Ambulatory Visit: Payer: Medicare Other

## 2024-01-26 ENCOUNTER — Encounter (HOSPITAL_COMMUNITY)
Admission: RE | Disposition: A | Discharge status: Home / Self Care | Payer: Self-pay | Source: Home / Self Care | Attending: Radiation Oncology

## 2024-01-26 ENCOUNTER — Ambulatory Visit (HOSPITAL_BASED_OUTPATIENT_CLINIC_OR_DEPARTMENT_OTHER): Payer: Self-pay | Admitting: Anesthesiology

## 2024-01-26 ENCOUNTER — Encounter (HOSPITAL_COMMUNITY): Payer: Self-pay | Admitting: Radiation Oncology

## 2024-01-26 VITALS — BP 115/61 | HR 70 | Resp 18

## 2024-01-26 DIAGNOSIS — F418 Other specified anxiety disorders: Secondary | ICD-10-CM

## 2024-01-26 DIAGNOSIS — C530 Malignant neoplasm of endocervix: Secondary | ICD-10-CM

## 2024-01-26 DIAGNOSIS — Z923 Personal history of irradiation: Secondary | ICD-10-CM | POA: Insufficient documentation

## 2024-01-26 DIAGNOSIS — C538 Malignant neoplasm of overlapping sites of cervix uteri: Secondary | ICD-10-CM

## 2024-01-26 DIAGNOSIS — Z9221 Personal history of antineoplastic chemotherapy: Secondary | ICD-10-CM | POA: Insufficient documentation

## 2024-01-26 LAB — RAD ONC ARIA SESSION SUMMARY
Course Elapsed Days: 46
Plan Fractions Treated to Date: 1
Plan Prescribed Dose Per Fraction: 5.5 Gy
Plan Total Fractions Prescribed: 1
Plan Total Prescribed Dose: 5.5 Gy
Reference Point Dosage Given to Date: 3.7154 Gy
Reference Point Dosage Given to Date: 4.1097 Gy
Reference Point Dosage Given to Date: 5.5 Gy
Reference Point Dosage Given to Date: 7.9728 Gy
Reference Point Dosage Given to Date: 9.0815 Gy
Reference Point Session Dosage Given: 3.7154 Gy
Reference Point Session Dosage Given: 4.1097 Gy
Reference Point Session Dosage Given: 5.5 Gy
Reference Point Session Dosage Given: 7.9728 Gy
Reference Point Session Dosage Given: 9.0815 Gy
Session Number: 31

## 2024-01-26 SURGERY — INSERTION, UTERINE TANDEM AND RING OR CYLINDER, FOR BRACHYTHERAPY
Anesthesia: General

## 2024-01-26 MED ORDER — CHLORHEXIDINE GLUCONATE 0.12 % MT SOLN
15.0000 mL | Freq: Once | OROMUCOSAL | Status: AC
  Administered 2024-01-26: 15 mL via OROMUCOSAL

## 2024-01-26 MED ORDER — ACETAMINOPHEN 500 MG PO TABS
1000.0000 mg | ORAL_TABLET | Freq: Once | ORAL | Status: AC
  Administered 2024-01-26: 1000 mg via ORAL
  Filled 2024-01-26: qty 2

## 2024-01-26 MED ORDER — AMISULPRIDE (ANTIEMETIC) 5 MG/2ML IV SOLN: 10.0000 mg | Freq: Once | INTRAVENOUS | Status: DC | PRN

## 2024-01-26 MED ORDER — HYDROMORPHONE HCL 1 MG/ML IJ SOLN
INTRAMUSCULAR | Status: AC
  Filled 2024-01-26: qty 1

## 2024-01-26 MED ORDER — LACTATED RINGERS IV SOLN: INTRAVENOUS | Status: DC | PRN

## 2024-01-26 MED ORDER — HYDROMORPHONE HCL 1 MG/ML IJ SOLN
0.5000 mg | Freq: Once | INTRAMUSCULAR | Status: AC
  Administered 2024-01-26: 0.5 mg via INTRAVENOUS

## 2024-01-26 MED ORDER — PROPOFOL 10 MG/ML IV BOLUS
INTRAVENOUS | Status: DC | PRN
  Administered 2024-01-26: 100 mg via INTRAVENOUS
  Administered 2024-01-26 (×2): 40 mg via INTRAVENOUS
  Administered 2024-01-26: 20 mg via INTRAVENOUS

## 2024-01-26 MED ORDER — MIDAZOLAM HCL 2 MG/2ML IJ SOLN
INTRAMUSCULAR | Status: DC | PRN
Start: 2024-01-26 — End: 2024-01-26
  Administered 2024-01-26: 2 mg via INTRAVENOUS

## 2024-01-26 MED ORDER — KETOROLAC TROMETHAMINE 30 MG/ML IJ SOLN
INTRAMUSCULAR | Status: AC
  Filled 2024-01-26: qty 1

## 2024-01-26 MED ORDER — KETOROLAC TROMETHAMINE 30 MG/ML IJ SOLN
30.0000 mg | Freq: Once | INTRAMUSCULAR | Status: AC | PRN
  Administered 2024-01-26: 30 mg via INTRAVENOUS

## 2024-01-26 MED ORDER — DEXAMETHASONE SODIUM PHOSPHATE 10 MG/ML IJ SOLN
INTRAMUSCULAR | Status: AC
  Filled 2024-01-26: qty 1

## 2024-01-26 MED ORDER — FENTANYL CITRATE (PF) 100 MCG/2ML IJ SOLN
INTRAMUSCULAR | Status: AC
  Filled 2024-01-26: qty 2

## 2024-01-26 MED ORDER — OXYCODONE HCL 5 MG PO TABS
5.0000 mg | ORAL_TABLET | Freq: Once | ORAL | Status: AC | PRN
  Administered 2024-01-26: 5 mg via ORAL

## 2024-01-26 MED ORDER — OXYCODONE HCL 5 MG PO TABS
ORAL_TABLET | ORAL | Status: AC
  Filled 2024-01-26: qty 1

## 2024-01-26 MED ORDER — HYDROMORPHONE HCL 1 MG/ML IJ SOLN: 0.2500 mg | INTRAMUSCULAR | Status: DC | PRN

## 2024-01-26 MED ORDER — FENTANYL CITRATE (PF) 100 MCG/2ML IJ SOLN
INTRAMUSCULAR | Status: DC | PRN
  Administered 2024-01-26 (×2): 25 ug via INTRAVENOUS
  Administered 2024-01-26: 50 ug via INTRAVENOUS

## 2024-01-26 MED ORDER — PHENYLEPHRINE 80 MCG/ML (10ML) SYRINGE FOR IV PUSH (FOR BLOOD PRESSURE SUPPORT)
PREFILLED_SYRINGE | INTRAVENOUS | Status: DC | PRN
  Administered 2024-01-26 (×4): 80 ug via INTRAVENOUS

## 2024-01-26 MED ORDER — ONDANSETRON HCL 4 MG/2ML IJ SOLN
INTRAMUSCULAR | Status: DC | PRN
  Administered 2024-01-26: 4 mg via INTRAVENOUS

## 2024-01-26 MED ORDER — LIDOCAINE HCL (CARDIAC) PF 100 MG/5ML IV SOSY
PREFILLED_SYRINGE | INTRAVENOUS | Status: DC | PRN
  Administered 2024-01-26: 60 mg via INTRATRACHEAL

## 2024-01-26 MED ORDER — PHENYLEPHRINE 80 MCG/ML (10ML) SYRINGE FOR IV PUSH (FOR BLOOD PRESSURE SUPPORT)
PREFILLED_SYRINGE | INTRAVENOUS | Status: AC
  Filled 2024-01-26: qty 10

## 2024-01-26 MED ORDER — DEXAMETHASONE SODIUM PHOSPHATE 10 MG/ML IJ SOLN
INTRAMUSCULAR | Status: DC | PRN
  Administered 2024-01-26: 8 mg via INTRAVENOUS

## 2024-01-26 MED ORDER — ESTRADIOL 0.1 MG/GM VA CREA
TOPICAL_CREAM | VAGINAL | Status: AC
  Filled 2024-01-26: qty 42.5

## 2024-01-26 MED ORDER — OXYCODONE HCL 5 MG PO TABS
ORAL_TABLET | ORAL | Status: AC
Start: 2024-01-26 — End: 2024-01-26
  Filled 2024-01-26: qty 1

## 2024-01-26 MED ORDER — LACTATED RINGERS IV SOLN: INTRAVENOUS | Status: DC

## 2024-01-26 MED ORDER — PROPOFOL 10 MG/ML IV BOLUS
INTRAVENOUS | Status: AC
  Filled 2024-01-26: qty 20

## 2024-01-26 MED ORDER — ONDANSETRON HCL 4 MG/2ML IJ SOLN
INTRAMUSCULAR | Status: AC
  Filled 2024-01-26: qty 2

## 2024-01-26 MED ORDER — STERILE WATER FOR IRRIGATION IR SOLN
Status: DC | PRN
  Administered 2024-01-26: 1000 mL

## 2024-01-26 MED ORDER — SODIUM CHLORIDE 0.9 % IR SOLN
Status: DC | PRN
  Administered 2024-01-26: 1000 mL via INTRAVESICAL

## 2024-01-26 MED ORDER — LIDOCAINE HCL (PF) 2 % IJ SOLN
INTRAMUSCULAR | Status: AC
  Filled 2024-01-26: qty 5

## 2024-01-26 MED ORDER — ONDANSETRON HCL 4 MG/2ML IJ SOLN: 4.0000 mg | Freq: Once | INTRAMUSCULAR | Status: DC | PRN

## 2024-01-26 MED ORDER — MEPERIDINE HCL 50 MG/ML IJ SOLN: 6.2500 mg | INTRAMUSCULAR | Status: DC | PRN

## 2024-01-26 MED ORDER — OXYCODONE HCL 5 MG/5ML PO SOLN: 5.0000 mg | Freq: Once | ORAL | Status: AC | PRN

## 2024-01-26 MED ORDER — SODIUM CHLORIDE 0.9 % IV SOLN
INTRAVENOUS | Status: DC
Start: 2024-01-26 — End: 2024-01-27
  Filled 2024-01-26 (×3): qty 150

## 2024-01-26 MED ORDER — MIDAZOLAM HCL 2 MG/2ML IJ SOLN
INTRAMUSCULAR | Status: AC
Start: 2024-01-26 — End: ?
  Filled 2024-01-26: qty 2

## 2024-01-26 MED ORDER — ORAL CARE MOUTH RINSE: 15.0000 mL | Freq: Once | OROMUCOSAL | Status: AC

## 2024-01-26 SURGICAL SUPPLY — 30 items
BAG COUNTER SPONGE SURGICOUNT (BAG) IMPLANT
BAG URINE DRAIN 2000ML AR STRL (UROLOGICAL SUPPLIES) ×1 IMPLANT
CATH FOLEY 2WAY SLVR 5CC 16FR (CATHETERS) IMPLANT
CATH FOLEY 3WAY 5CC 16FR (CATHETERS) IMPLANT
COVER BACK TABLE 60X90IN (DRAPES) ×1 IMPLANT
COVER SURGICAL LIGHT HANDLE (MISCELLANEOUS) ×1 IMPLANT
DILATOR CANAL MILEX (MISCELLANEOUS) IMPLANT
DRAPE SHEET LG 3/4 BI-LAMINATE (DRAPES) ×1 IMPLANT
DRAPE UNDERBUTTOCKS STRL (DISPOSABLE) ×1 IMPLANT
GAUZE 4X4 16PLY ~~LOC~~+RFID DBL (SPONGE) ×1 IMPLANT
GAUZE PAD ABD 8X10 STRL (GAUZE/BANDAGES/DRESSINGS) ×2 IMPLANT
GLOVE BIO SURGEON STRL SZ 6 (GLOVE) ×2 IMPLANT
GLOVE BIO SURGEON STRL SZ7.5 (GLOVE) ×2 IMPLANT
GOWN STRL REUS W/ TWL LRG LVL3 (GOWN DISPOSABLE) ×1 IMPLANT
KIT BASIN OR (CUSTOM PROCEDURE TRAY) ×1 IMPLANT
KIT TURNOVER KIT A (KITS) ×1 IMPLANT
LEGGING LITHOTOMY PAIR STRL (DRAPES) ×1 IMPLANT
MAT PREVALON FULL STRYKER (MISCELLANEOUS) ×1 IMPLANT
PACKING VAGINAL (PACKING) ×1 IMPLANT
PENCIL SMOKE EVACUATOR (MISCELLANEOUS) IMPLANT
PLUG CATH AND CAP STRL 200 (CATHETERS) IMPLANT
SET IRRIG Y TYPE TUR BLADDER L (SET/KITS/TRAYS/PACK) IMPLANT
SURGILUBE 2OZ TUBE FLIPTOP (MISCELLANEOUS) ×1 IMPLANT
SUT PROLENE 2 0 CT2 30 (SUTURE) IMPLANT
SUT VIC AB 0 CT1 27XBRD ANTBC (SUTURE) IMPLANT
SYR 10ML LL (SYRINGE) ×1 IMPLANT
TOWEL OR 17X26 10 PK STRL BLUE (TOWEL DISPOSABLE) ×1 IMPLANT
UNDERPAD 30X36 HEAVY ABSORB (UNDERPADS AND DIAPERS) ×2 IMPLANT
WATER STERILE IRR 500ML POUR (IV SOLUTION) ×1 IMPLANT
YANKAUER SUCT BULB TIP 10FT TU (MISCELLANEOUS) ×1 IMPLANT

## 2024-01-26 NOTE — Op Note (Signed)
 01/26/2024  8:58 AM  PATIENT:  Kelly Thomas  68 y.o. female  PRE-OPERATIVE DIAGNOSIS:  ENDOCERVIX CANCER  POST-OPERATIVE DIAGNOSIS:  ENDOCERVIX CANCER  PROCEDURE:  Procedure(s): INSERTION, UTERINE TANDEM AND RING OR CYLINDER, FOR BRACHYTHERAPY (N/A) [place] INTRAOPERATIVE (N/A)  SURGEON:  Surgeons and Role:    * Antony Blackbird, MD - Primary  PHYSICIAN ASSISTANT:   ASSISTANTS: none   ANESTHESIA:   general  EBL:  1-2 mL BLOOD ADMINISTERED:none  DRAINS: Urinary Catheter (Foley)   LOCAL MEDICATIONS USED:  NONE  SPECIMEN:  No Specimen  DISPOSITION OF SPECIMEN:  N/A  COUNTS:  YES  TOURNIQUET:  * No tourniquets in log *  DICTATION: The patient was prepped and draped in the usual sterile fashion and placed in the dorsolithotomy position.  Timeout for the procedure, preoperative medications and estimated length of the procedure was performed.  A Foley catheter was placed and backfilled with approximately 200 cc of saline for ultrasound imaging purposes.  Patient proceeded to undergo a transvaginal ultrasound measurement of the cervix.  Size estimates were placed in the ultrasound report.  She then proceeded to undergo exam under anesthesia.  The cervical mass responded very well to external beam radiation treatment.  The estimated size of the cervix was approximately 2-1/2 x 3 cm.  The right lateral fornices was somewhat limited but no obvious parametrial involvement.  The patient then underwent sounding and dilation of the cervical os.  The estimated length of the uterus was approximately 6.9 cm.  She was noted to have a large fibroid in the left posterior location of the uterus.  A smaller fibroid was noted along the lower uterus in the anterior location.  After dilation of the cervix, a 60 mm cervical sleeve was placed.  A 60 mm 45 degree tandem was placed with inside the cervical sleeve.  Excellent position was noted on the transabdominal ultrasound images.  Patient then had placement of  a 45 degree cervical ring with a small shielding cap in place.  This was followed by a rectal paddle posteriorly.  There was no additional room for vaginal packing.  She tolerated the procedure well and was subsequently transported to the recovery room in stable condition.  Later in the day the patient will proceed with her first high-dose-rate treatment.  Plan is for the patient to receive 5.5 Gy to the high risk clinical target volume at the cervical region.  PLAN OF CARE:  Transfer to radiation oncology for planning and treatment  PATIENT DISPOSITION:  PACU - hemodynamically stable.   Delay start of Pharmacological VTE agent (>24hrs) due to surgical blood loss or risk of bleeding: not applicable

## 2024-01-26 NOTE — Anesthesia Procedure Notes (Signed)
 Procedure Name: LMA Insertion Date/Time: 01/26/2024 7:46 AM  Performed by: Micki Riley, CRNAPre-anesthesia Checklist: Patient identified, Emergency Drugs available, Suction available and Patient being monitored Patient Re-evaluated:Patient Re-evaluated prior to induction Oxygen Delivery Method: Circle System Utilized Preoxygenation: Pre-oxygenation with 100% oxygen Induction Type: IV induction Ventilation: Mask ventilation without difficulty LMA: LMA inserted LMA Size: 4.0 and 3.0 Number of attempts: 1 Airway Equipment and Method: Bite block Placement Confirmation: positive ETCO2 and breath sounds checked- equal and bilateral Tube secured with: Tape Dental Injury: Teeth and Oropharynx as per pre-operative assessment

## 2024-01-26 NOTE — Patient Instructions (Signed)
 IMMEDIATELY FOLLOWING SURGERY: Do not drive or operate machinery for the first twenty four hours after surgery. Do not make any important decisions for twenty four hours after surgery or while taking narcotic pain medications or sedatives. If you develop intractable nausea and vomiting or a severe headache please notify your doctor immediately.   FOLLOW-UP: You do not need to follow up with anesthesia unless specifically instructed to do so.   WOUND CARE INSTRUCTIONS (if applicable): Expect some mild vaginal bleeding, but if large amount of bleeding occurs please contact Dr. Roselind Messier at [phone] or the Radiation On-Call physician. Call for any fever greater than 101.0 degrees or increasing vaginal//abdominal pain or trouble urinating.   QUESTIONS?: Please feel free to call your physician or the hospital operator if you have any questions, and they will be happy to assist you. Resume all medications: as listed on your after visit summary. Your next appointment is:  Future Appointments  Date Time Provider Department Center  02/03/2024  9:30 AM Antony Blackbird, MD [facility] None  02/03/2024 11:00 AM [facility] NURSE [facility] None  02/03/2024  2:00 PM Antony Blackbird, MD [facility] None  03/18/2024  9:00 AM [facility] FLUSH [facility] None  03/18/2024  9:40 AM Artis Delay, MD [facility] None

## 2024-01-26 NOTE — Anesthesia Postprocedure Evaluation (Signed)
 Anesthesia Post Note  Patient: Kelly Thomas  Procedure(s) Performed: INSERTION, UTERINE TANDEM AND RING OR CYLINDER, FOR BRACHYTHERAPY [place] INTRAOPERATIVE     Patient location during evaluation: PACU Anesthesia Type: General Level of consciousness: awake and alert, oriented and patient cooperative Pain management: pain level controlled Vital Signs Assessment: post-procedure vital signs reviewed and stable Respiratory status: spontaneous breathing, nonlabored ventilation and respiratory function stable Cardiovascular status: blood pressure returned to baseline and stable Postop Assessment: no apparent nausea or vomiting Anesthetic complications: no   No notable events documented.  Last Vitals:  Vitals:   01/26/24 0845 01/26/24 0900  BP: 125/73 127/78  Pulse: 87 81  Resp: 11 12  Temp:    SpO2: 100% 100%    Last Pain:  Vitals:   01/26/24 0900  TempSrc:   PainSc: 0-No pain                 Lannie Fields

## 2024-01-26 NOTE — Interval H&P Note (Signed)
 History and Physical Interval Note:  01/26/2024 7:15 AM  Kelly Thomas  has presented today for surgery, with the diagnosis of ENDOCERVIX CANCER.  The various methods of treatment have been discussed with the patient and family. After consideration of risks, benefits and other options for treatment, the patient has consented to  Procedure(s): INSERTION, UTERINE TANDEM AND RING OR CYLINDER, FOR BRACHYTHERAPY (N/A) [place] INTRAOPERATIVE (N/A) as a surgical intervention.  The patient's history has been reviewed, patient examined, no change in status, stable for surgery.  I have reviewed the patient's chart and labs.  Questions were answered to the patient's satisfaction.     Antony Blackbird

## 2024-01-26 NOTE — Radiation Completion Notes (Signed)
 Patient Name: Kelly Thomas, Kelly Thomas MRN: [id number] Date of Birth: [birth year] Referring Physician: Eugene Garnet, M.D. Date of Service: 2024-01-26 Radiation Oncologist: Arnette Schaumann, M.D. [facility] [place]                             RADIATION ONCOLOGY END OF TREATMENT NOTE     Diagnosis: C53.0 Malignant neoplasm of endocervix Staging on 2023-12-01: Cervical cancer (HCC) T=cT2b, N=cN0, M=cM0 Intent: Curative     ==========DELIVERED PLANS==========  First Treatment Date: 2023-12-11 Last Treatment Date: 2024-01-26   Plan Name: Pelvis Site: Pelvis Technique: 3D Mode: Photon Dose Per Fraction: 1.8 Gy Prescribed Dose (Delivered / Prescribed): 9 Gy / 9 Gy Prescribed Fxs (Delivered / Prescribed): 5 / 5   Plan Name: Pelvis_Bst Site: Pelvis Technique: IMRT Mode: Photon Dose Per Fraction: 1.8 Gy Prescribed Dose (Delivered / Prescribed): 36 Gy / 36 Gy Prescribed Fxs (Delivered / Prescribed): 20 / 20   Plan Name: Cervix_HDR_F1 Site: Cervix Technique: HDR Ir-192 Mode: Brachytherapy Dose Per Fraction: 5.5 Gy Prescribed Dose (Delivered / Prescribed): 5.5 Gy / 5.5 Gy Prescribed Fxs (Delivered / Prescribed): 1 / 1   Plan Name: Pelvis_Bst2 Site: Pelvis Technique: 3D Mode: Photon Dose Per Fraction: 1.8 Gy Prescribed Dose (Delivered / Prescribed): 9 Gy / 9 Gy Prescribed Fxs (Delivered / Prescribed): 5 / 5     ==========ON TREATMENT VISIT DATES========== 2023-12-16, 2023-12-23, 2023-12-30, 2024-01-06, 2024-01-13, 2024-01-20, 2024-01-26     ==========UPCOMING VISITS==========       ==========APPENDIX - ON TREATMENT VISIT NOTES==========   See weekly On Treatment Notes in Epic for details in the Media tab (listed as Progress notes on the On Treatment Visit Dates listed above).

## 2024-01-26 NOTE — Transfer of Care (Signed)
 Immediate Anesthesia Transfer of Care Note  Patient: Kennedy Bucker  Procedure(s) Performed: INSERTION, UTERINE TANDEM AND RING OR CYLINDER, FOR BRACHYTHERAPY [place] INTRAOPERATIVE  Patient Location: PACU  Anesthesia Type:General  Level of Consciousness: awake, alert , and responds to stimulation  Airway & Oxygen Therapy: Patient Spontanous Breathing, Patient connected to face mask oxygen, and Patient remains intubated per anesthesia plan  Post-op Assessment: Report given to RN  Post vital signs: stable  Last Vitals:  Vitals Value Taken Time  BP 120/77 01/26/24 0835  Temp    Pulse 88 01/26/24 0841  Resp 12 01/26/24 0841  SpO2 100 % 01/26/24 0841  Vitals shown include unfiled device data.  Last Pain:  Vitals:   01/26/24 0553  TempSrc: Oral  PainSc: 0-No pain      Patients Stated Pain Goal: 3 (01/26/24 0553)  Complications: No notable events documented.

## 2024-01-27 ENCOUNTER — Ambulatory Visit: Payer: Medicare Other

## 2024-01-27 ENCOUNTER — Encounter (HOSPITAL_COMMUNITY): Payer: Self-pay | Admitting: Radiation Oncology

## 2024-01-28 ENCOUNTER — Ambulatory Visit: Payer: Medicare Other

## 2024-01-28 ENCOUNTER — Other Ambulatory Visit (HOSPITAL_COMMUNITY): Payer: Self-pay | Admitting: Radiation Oncology

## 2024-01-28 DIAGNOSIS — C530 Malignant neoplasm of endocervix: Secondary | ICD-10-CM

## 2024-01-29 ENCOUNTER — Ambulatory Visit: Payer: Medicare Other

## 2024-01-30 ENCOUNTER — Ambulatory Visit: Payer: Medicare Other

## 2024-01-30 ENCOUNTER — Encounter: Payer: Self-pay | Admitting: Gynecologic Oncology

## 2024-02-02 ENCOUNTER — Ambulatory Visit: Payer: Medicare Other

## 2024-02-02 NOTE — Anesthesia Preprocedure Evaluation (Signed)
 Anesthesia Evaluation  Patient identified by MRN, date of birth, ID band Patient awake    Reviewed: Allergy & Precautions, NPO status , Patient's Chart, lab work & pertinent test results  Airway Mallampati: III  TM Distance: >3 FB Neck ROM: Full    Dental  (+) Partial Lower, Partial Upper, Dental Advisory Given   Pulmonary neg pulmonary ROS   Pulmonary exam normal breath sounds clear to auscultation       Cardiovascular negative cardio ROS Normal cardiovascular exam Rhythm:Regular Rate:Normal     Neuro/Psych  PSYCHIATRIC DISORDERS Anxiety Depression    negative neurological ROS     GI/Hepatic negative GI ROS, Neg liver ROS,,,  Endo/Other  negative endocrine ROS    Renal/GU negative Renal ROS  negative genitourinary   Musculoskeletal negative musculoskeletal ROS (+)    Abdominal   Peds  Hematology  (+) Blood dyscrasia, anemia Hb 11.7, plt 142   Anesthesia Other Findings   Reproductive/Obstetrics Cervical ca                             Anesthesia Physical Anesthesia Plan  ASA: 2  Anesthesia Plan: General   Post-op Pain Management: Tylenol PO (pre-op)* and Celebrex PO (pre-op)*   Induction: Intravenous  PONV Risk Score and Plan: 3 and Ondansetron, Dexamethasone, Midazolam and Treatment may vary due to age or medical condition  Airway Management Planned: LMA  Additional Equipment: None  Intra-op Plan:   Post-operative Plan: Extubation in OR  Informed Consent: I have reviewed the patients History and Physical, chart, labs and discussed the procedure including the risks, benefits and alternatives for the proposed anesthesia with the patient or authorized representative who has indicated his/her understanding and acceptance.     Dental advisory given  Plan Discussed with: CRNA and Anesthesiologist  Anesthesia Plan Comments:        Anesthesia Quick Evaluation

## 2024-02-02 NOTE — Progress Notes (Addendum)
 Patient phoned to give updated information on surgery.  Date of Surgery - 02-03-24  Arrival Time - 5:15 AM and check in at admitting.    NPO Status - patient reminded to not eat solid food or drink liquids after midnight.    Medications morning of surgery - If needed Oxycodone, Ondansetron or Compazine with a sip of water  No change in medical history, allergies per patient.  Transportation home - [place] [place] [phone]  All questions answered and patient stated understanding

## 2024-02-03 ENCOUNTER — Encounter (HOSPITAL_COMMUNITY)
Admission: RE | Disposition: A | Discharge status: Home / Self Care | Payer: Self-pay | Source: Home / Self Care | Attending: Radiation Oncology

## 2024-02-03 ENCOUNTER — Ambulatory Visit
Admission: RE | Admit: 2024-02-03 | Discharge: 2024-02-03 | Disposition: A | Discharge status: Home / Self Care | Source: Ambulatory Visit | Attending: Radiation Oncology | Admitting: Radiation Oncology

## 2024-02-03 ENCOUNTER — Encounter (HOSPITAL_COMMUNITY): Payer: Self-pay | Admitting: Radiation Oncology

## 2024-02-03 ENCOUNTER — Ambulatory Visit (HOSPITAL_BASED_OUTPATIENT_CLINIC_OR_DEPARTMENT_OTHER): Admitting: Anesthesiology

## 2024-02-03 ENCOUNTER — Other Ambulatory Visit: Payer: Self-pay

## 2024-02-03 ENCOUNTER — Ambulatory Visit (HOSPITAL_COMMUNITY)
Admission: RE | Admit: 2024-02-03 | Discharge: 2024-02-03 | Disposition: A | Discharge status: Home / Self Care | Attending: Radiation Oncology | Admitting: Radiation Oncology

## 2024-02-03 ENCOUNTER — Ambulatory Visit (HOSPITAL_COMMUNITY)
Admission: RE | Admit: 2024-02-03 | Discharge: 2024-02-03 | Disposition: A | Discharge status: Home / Self Care | Source: Ambulatory Visit | Attending: Radiation Oncology | Admitting: Radiation Oncology

## 2024-02-03 ENCOUNTER — Ambulatory Visit (HOSPITAL_COMMUNITY): Admitting: Anesthesiology

## 2024-02-03 ENCOUNTER — Ambulatory Visit: Payer: Medicare Other

## 2024-02-03 VITALS — BP 125/70 | HR 63 | Resp 18

## 2024-02-03 DIAGNOSIS — C530 Malignant neoplasm of endocervix: Secondary | ICD-10-CM

## 2024-02-03 DIAGNOSIS — F419 Anxiety disorder, unspecified: Secondary | ICD-10-CM | POA: Insufficient documentation

## 2024-02-03 DIAGNOSIS — Z95828 Presence of other vascular implants and grafts: Secondary | ICD-10-CM | POA: Diagnosis not present

## 2024-02-03 DIAGNOSIS — F32A Depression, unspecified: Secondary | ICD-10-CM | POA: Insufficient documentation

## 2024-02-03 DIAGNOSIS — R634 Abnormal weight loss: Secondary | ICD-10-CM | POA: Diagnosis not present

## 2024-02-03 DIAGNOSIS — Z01818 Encounter for other preprocedural examination: Secondary | ICD-10-CM | POA: Diagnosis not present

## 2024-02-03 DIAGNOSIS — F418 Other specified anxiety disorders: Secondary | ICD-10-CM

## 2024-02-03 DIAGNOSIS — R300 Dysuria: Secondary | ICD-10-CM | POA: Diagnosis not present

## 2024-02-03 DIAGNOSIS — D61818 Other pancytopenia: Secondary | ICD-10-CM | POA: Diagnosis not present

## 2024-02-03 DIAGNOSIS — L270 Generalized skin eruption due to drugs and medicaments taken internally: Secondary | ICD-10-CM | POA: Diagnosis not present

## 2024-02-03 DIAGNOSIS — E785 Hyperlipidemia, unspecified: Secondary | ICD-10-CM | POA: Diagnosis not present

## 2024-02-03 LAB — RAD ONC ARIA SESSION SUMMARY
Course Elapsed Days: 54
Plan Fractions Treated to Date: 1
Plan Prescribed Dose Per Fraction: 5.5 Gy
Plan Total Fractions Prescribed: 1
Plan Total Prescribed Dose: 5.5 Gy
Reference Point Dosage Given to Date: 11 Gy
Reference Point Dosage Given to Date: 17.4121 Gy
Reference Point Dosage Given to Date: 18.5305 Gy
Reference Point Dosage Given to Date: 8.0328 Gy
Reference Point Dosage Given to Date: 8.6101 Gy
Reference Point Session Dosage Given: 4.3175 Gy
Reference Point Session Dosage Given: 4.5004 Gy
Reference Point Session Dosage Given: 5.5 Gy
Reference Point Session Dosage Given: 9.4392 Gy
Reference Point Session Dosage Given: 9.449 Gy
Session Number: 32

## 2024-02-03 SURGERY — INSERTION, UTERINE TANDEM AND RING OR CYLINDER, FOR BRACHYTHERAPY
Anesthesia: General | Site: Vagina

## 2024-02-03 MED ORDER — HYDROMORPHONE HCL 1 MG/ML IJ SOLN
0.2500 mg | INTRAMUSCULAR | Status: DC | PRN
  Administered 2024-02-03 (×2): 0.5 mg via INTRAVENOUS

## 2024-02-03 MED ORDER — SODIUM CHLORIDE 0.9 % IR SOLN
Status: DC | PRN
  Administered 2024-02-03: 200 mL via INTRAVESICAL

## 2024-02-03 MED ORDER — PHENYLEPHRINE 80 MCG/ML (10ML) SYRINGE FOR IV PUSH (FOR BLOOD PRESSURE SUPPORT)
PREFILLED_SYRINGE | INTRAVENOUS | Status: AC
  Filled 2024-02-03: qty 10

## 2024-02-03 MED ORDER — CELECOXIB 200 MG PO CAPS
200.0000 mg | ORAL_CAPSULE | Freq: Once | ORAL | Status: AC
  Administered 2024-02-03: 200 mg via ORAL
  Filled 2024-02-03: qty 1

## 2024-02-03 MED ORDER — DEXAMETHASONE SODIUM PHOSPHATE 10 MG/ML IJ SOLN
INTRAMUSCULAR | Status: DC | PRN
  Administered 2024-02-03: 10 mg via INTRAVENOUS

## 2024-02-03 MED ORDER — CHLORHEXIDINE GLUCONATE 0.12 % MT SOLN
15.0000 mL | Freq: Once | OROMUCOSAL | Status: AC
  Administered 2024-02-03: 15 mL via OROMUCOSAL

## 2024-02-03 MED ORDER — HYDROMORPHONE HCL 1 MG/ML IJ SOLN
INTRAMUSCULAR | Status: AC
  Filled 2024-02-03: qty 1

## 2024-02-03 MED ORDER — HYDROMORPHONE HCL 1 MG/ML IJ SOLN
0.5000 mg | Freq: Once | INTRAMUSCULAR | Status: AC
  Administered 2024-02-03: 0.5 mg via INTRAVENOUS

## 2024-02-03 MED ORDER — LACTATED RINGERS IV SOLN
1000.0000 mL | INTRAVENOUS | Status: DC
  Administered 2024-02-03: 1000 mL via INTRAVENOUS
  Filled 2024-02-03: qty 1000

## 2024-02-03 MED ORDER — LACTATED RINGERS IV SOLN: INTRAVENOUS | Status: DC

## 2024-02-03 MED ORDER — PROPOFOL 10 MG/ML IV BOLUS
INTRAVENOUS | Status: DC | PRN
  Administered 2024-02-03: 140 mg via INTRAVENOUS

## 2024-02-03 MED ORDER — ORAL CARE MOUTH RINSE: 15.0000 mL | Freq: Once | OROMUCOSAL | Status: AC

## 2024-02-03 MED ORDER — OXYCODONE HCL 5 MG PO TABS: 5.0000 mg | ORAL_TABLET | Freq: Once | ORAL | Status: DC | PRN

## 2024-02-03 MED ORDER — MEPERIDINE HCL 50 MG/ML IJ SOLN: 6.2500 mg | INTRAMUSCULAR | Status: DC | PRN

## 2024-02-03 MED ORDER — ESTRADIOL 0.1 MG/GM VA CREA
TOPICAL_CREAM | VAGINAL | Status: AC
  Filled 2024-02-03: qty 42.5

## 2024-02-03 MED ORDER — OXYCODONE HCL 5 MG/5ML PO SOLN: 5.0000 mg | Freq: Once | ORAL | Status: DC | PRN

## 2024-02-03 MED ORDER — SODIUM CHLORIDE 0.9 % IV SOLN
INTRAVENOUS | Status: DC
  Filled 2024-02-03 (×2): qty 150

## 2024-02-03 MED ORDER — STERILE WATER FOR IRRIGATION IR SOLN
Status: DC | PRN
  Administered 2024-02-03: 1000 mL

## 2024-02-03 MED ORDER — DEXAMETHASONE SODIUM PHOSPHATE 10 MG/ML IJ SOLN
INTRAMUSCULAR | Status: AC
  Filled 2024-02-03: qty 1

## 2024-02-03 MED ORDER — FENTANYL CITRATE (PF) 100 MCG/2ML IJ SOLN
INTRAMUSCULAR | Status: AC
  Filled 2024-02-03: qty 2

## 2024-02-03 MED ORDER — MIDAZOLAM HCL 2 MG/2ML IJ SOLN
INTRAMUSCULAR | Status: AC
  Filled 2024-02-03: qty 2

## 2024-02-03 MED ORDER — MIDAZOLAM HCL 2 MG/2ML IJ SOLN
INTRAMUSCULAR | Status: DC | PRN
  Administered 2024-02-03: 2 mg via INTRAVENOUS

## 2024-02-03 MED ORDER — SODIUM CHLORIDE 0.9 % IV SOLN: 12.5000 mg | INTRAVENOUS | Status: DC | PRN

## 2024-02-03 MED ORDER — ACETAMINOPHEN 500 MG PO TABS
1000.0000 mg | ORAL_TABLET | Freq: Once | ORAL | Status: AC
  Administered 2024-02-03: 1000 mg via ORAL
  Filled 2024-02-03: qty 2

## 2024-02-03 MED ORDER — ONDANSETRON HCL 4 MG/2ML IJ SOLN
INTRAMUSCULAR | Status: DC | PRN
  Administered 2024-02-03: 4 mg via INTRAVENOUS

## 2024-02-03 MED ORDER — PHENYLEPHRINE 80 MCG/ML (10ML) SYRINGE FOR IV PUSH (FOR BLOOD PRESSURE SUPPORT)
PREFILLED_SYRINGE | INTRAVENOUS | Status: DC | PRN
  Administered 2024-02-03: 160 ug via INTRAVENOUS
  Administered 2024-02-03: 80 ug via INTRAVENOUS

## 2024-02-03 MED ORDER — LIDOCAINE HCL (PF) 2 % IJ SOLN
INTRAMUSCULAR | Status: DC | PRN
  Administered 2024-02-03: 60 mg via INTRADERMAL

## 2024-02-03 MED ORDER — AMISULPRIDE (ANTIEMETIC) 5 MG/2ML IV SOLN: 10.0000 mg | Freq: Once | INTRAVENOUS | Status: DC | PRN

## 2024-02-03 MED ORDER — FENTANYL CITRATE (PF) 100 MCG/2ML IJ SOLN
INTRAMUSCULAR | Status: DC | PRN
  Administered 2024-02-03: 25 ug via INTRAVENOUS

## 2024-02-03 MED ORDER — PROPOFOL 10 MG/ML IV BOLUS
INTRAVENOUS | Status: AC
  Filled 2024-02-03: qty 20

## 2024-02-03 MED ORDER — LIDOCAINE HCL (PF) 2 % IJ SOLN
INTRAMUSCULAR | Status: AC
Start: 2024-02-03 — End: ?
  Filled 2024-02-03: qty 5

## 2024-02-03 SURGICAL SUPPLY — 30 items
BAG COUNTER SPONGE SURGICOUNT (BAG) IMPLANT
BAG URINE DRAIN 2000ML AR STRL (UROLOGICAL SUPPLIES) ×2 IMPLANT
CATH FOLEY 2WAY SLVR 5CC 16FR (CATHETERS) IMPLANT
CATH FOLEY 3WAY 5CC 16FR (CATHETERS) IMPLANT
COVER BACK TABLE 60X90IN (DRAPES) ×2 IMPLANT
COVER SURGICAL LIGHT HANDLE (MISCELLANEOUS) ×2 IMPLANT
DILATOR CANAL MILEX (MISCELLANEOUS) IMPLANT
DRAPE SHEET LG 3/4 BI-LAMINATE (DRAPES) ×2 IMPLANT
DRAPE UNDERBUTTOCKS STRL (DISPOSABLE) ×2 IMPLANT
GAUZE 4X4 16PLY ~~LOC~~+RFID DBL (SPONGE) ×2 IMPLANT
GAUZE PAD ABD 8X10 STRL (GAUZE/BANDAGES/DRESSINGS) ×4 IMPLANT
GLOVE BIO SURGEON STRL SZ 6 (GLOVE) ×4 IMPLANT
GLOVE BIO SURGEON STRL SZ7.5 (GLOVE) ×4 IMPLANT
GOWN STRL REUS W/ TWL LRG LVL3 (GOWN DISPOSABLE) ×2 IMPLANT
KIT BASIN OR (CUSTOM PROCEDURE TRAY) ×2 IMPLANT
KIT TURNOVER KIT A (KITS) ×2 IMPLANT
LEGGING LITHOTOMY PAIR STRL (DRAPES) ×2 IMPLANT
MAT PREVALON FULL STRYKER (MISCELLANEOUS) ×2 IMPLANT
PACKING VAGINAL (PACKING) ×2 IMPLANT
PENCIL SMOKE EVACUATOR (MISCELLANEOUS) IMPLANT
PLUG CATH AND CAP STRL 200 (CATHETERS) IMPLANT
SET IRRIG Y TYPE TUR BLADDER L (SET/KITS/TRAYS/PACK) IMPLANT
SURGILUBE 2OZ TUBE FLIPTOP (MISCELLANEOUS) ×2 IMPLANT
SUT PROLENE 2 0 CT2 30 (SUTURE) IMPLANT
SUT VIC AB 0 CT1 27XBRD ANTBC (SUTURE) IMPLANT
SYR 10ML LL (SYRINGE) ×2 IMPLANT
TOWEL OR 17X26 10 PK STRL BLUE (TOWEL DISPOSABLE) ×2 IMPLANT
UNDERPAD 30X36 HEAVY ABSORB (UNDERPADS AND DIAPERS) ×4 IMPLANT
WATER STERILE IRR 500ML POUR (IV SOLUTION) ×2 IMPLANT
YANKAUER SUCT BULB TIP 10FT TU (MISCELLANEOUS) ×2 IMPLANT

## 2024-02-03 NOTE — Interval H&P Note (Signed)
 History and Physical Interval Note:  02/03/2024 7:22 AM  Kelly Thomas  has presented today for surgery, with the diagnosis of ENDOCERVIX CANCER.  The various methods of treatment have been discussed with the patient and family. After consideration of risks, benefits and other options for treatment, the patient has consented to  Procedure(s): INSERTION, UTERINE TANDEM AND RING OR CYLINDER, FOR BRACHYTHERAPY (N/A) [place] INTRAOPERATIVE (N/A) as a surgical intervention.  The patient's history has been reviewed, patient examined, no change in status, stable for surgery.  I have reviewed the patient's chart and labs.  Questions were answered to the patient's satisfaction.     Antony Blackbird

## 2024-02-03 NOTE — Anesthesia Postprocedure Evaluation (Signed)
 Anesthesia Post Note  Patient: Kelly Thomas  Procedure(s) Performed: INSERTION, UTERINE TANDEM AND RING OR CYLINDER, FOR BRACHYTHERAPY (Vagina ) [place] INTRAOPERATIVE (Abdomen)     Patient location during evaluation: PACU Anesthesia Type: General Level of consciousness: awake and alert Pain management: pain level controlled Vital Signs Assessment: post-procedure vital signs reviewed and stable Respiratory status: spontaneous breathing, nonlabored ventilation and respiratory function stable Cardiovascular status: blood pressure returned to baseline and stable Postop Assessment: no apparent nausea or vomiting Anesthetic complications: no   No notable events documented.  Last Vitals:  Vitals:   02/03/24 0845 02/03/24 0900  BP: 109/65 117/66  Pulse: 65 61  Resp: 10 11  Temp:  (!) 36.3 C  SpO2: 96% 97%    Last Pain:  Vitals:   02/03/24 0900  TempSrc:   PainSc: 3                  Lowella Curb

## 2024-02-03 NOTE — Anesthesia Procedure Notes (Signed)
 Procedure Name: LMA Insertion Date/Time: 02/03/2024 7:40 AM  Performed by: [place], Clydene Pugh, CRNAPre-anesthesia Checklist: Patient identified, Emergency Drugs available, Suction available and Patient being monitored Patient Re-evaluated:Patient Re-evaluated prior to induction Oxygen Delivery Method: Circle system utilized Preoxygenation: Pre-oxygenation with 100% oxygen Induction Type: IV induction Ventilation: Mask ventilation without difficulty LMA: LMA inserted LMA Size: 4.0 Number of attempts: 1 Airway Equipment and Method: Bite block Placement Confirmation: positive ETCO2 Tube secured with: Tape Dental Injury: Teeth and Oropharynx as per pre-operative assessment

## 2024-02-03 NOTE — Progress Notes (Signed)
  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 Identity was confirmed. All relevant records and images related to the planned course of therapy were reviewed. The patient freely provided informed written consent to proceed with treatment after reviewing the details related to the planned course of therapy. The consent form was witnessed and verified by the simulation staff. Then, the patient was set-up in a stable reproducible supine position for radiation therapy. The tandem ring system was accessed and fiducial markers were placed within the tandem and ring.   Simple treatment device note: On the operating room table the patient had construction of her custom tandem ring system. She will be treated with a 45 tandem/ring system. The patient had placement of a 60 mm tandem. A cervical ring with a large shielding was used for her treatment. A rectal paddle was also part of her custom set up device.  Verification simulation note: An AP and lateral film was obtained through the pelvis area. This was compared to the patient's planning films documenting accurate position of the tandem/ring system for treatment.  High-dose-rate brachytherapy treatment note:   The remote afterloading device was accessed through catheter system and attached to the tandem ring system. Patient then proceeded to undergo her second high-dose-rate treatment directed at the cervix. The patient was prescribed a dose of 5.5 Gy to the HRCTV.Marland Kitchen  Patient was treated with 2 channels using 25 dwell positions. Treatment time was 559.6 seconds. The patient tolerated the procedure well. After completion of her therapy, a radiation survey was performed documenting return of the iridium source into the GammaMed safe. The patient was then transferred to the nursing suite.  She then had removal of the rectal paddle followed by the tandem and ring system. The patient tolerated the removal well.  PLAN: The patient will return next week to undergo her third treatment.  ________________________________   Billie Lade, PhD, MD  This document serves as a record of services personally performed by Antony Blackbird, MD. It was created on his behalf by Herbie Saxon, a trained medical scribe. The creation of this record is based on the scribe's personal observations and the provider's statements to them. This document has been checked and approved by the attending provider.

## 2024-02-03 NOTE — Op Note (Signed)
 02/03/2024  8:36 AM  PATIENT:  Kelly Thomas  68 y.o. female  PRE-OPERATIVE DIAGNOSIS:  ENDOCERVIX CANCER  POST-OPERATIVE DIAGNOSIS:  ENDOCERVIX CANCER  PROCEDURE:  Procedure(s): INSERTION, UTERINE TANDEM AND RING OR CYLINDER, FOR BRACHYTHERAPY (N/A) [place] INTRAOPERATIVE (N/A)  SURGEON:  Surgeons and Role:    * Antony Blackbird, MD - Primary  PHYSICIAN ASSISTANT:   ASSISTANTS: none   ANESTHESIA:   general  EBL:  1 mL   BLOOD ADMINISTERED:none  DRAINS: Urinary Catheter (Foley)   LOCAL MEDICATIONS USED:  NONE  SPECIMEN:  No Specimen  DISPOSITION OF SPECIMEN:  N/A  COUNTS:  YES  TOURNIQUET:  * No tourniquets in log *  DICTATION: The patient was prepped and draped in the usual sterile fashion and placed in the dorsolithotomy position.  Timeout for the procedure, preoperative medications and estimated length of the procedure was performed.  A Foley catheter was placed and backfilled with approximately 200 cc of saline for ultrasound imaging purposes.  She then proceeded to undergo exam under anesthesia.  The cervical mass responded very well to external beam radiation treatment.  The estimated size of the cervix was approximately 2-1/2 x 3 cm.  The right lateral fornice was somewhat limited but no obvious parametrial involvement.  The patient then underwent sounding and dilation of the cervical os.  The estimated length of the uterus was approximately 6.9 cm.  She was noted to have a large fibroid in the left posterior location of the uterus.  On transabdominal ultrasound this fibroid measured approximately 2.5 cm in greatest dimension.  A smaller fibroid was noted along the lower uterus in the anterior location.  After dilation of the cervix, a 60 mm cervical sleeve was placed.  A 60 mm 45 degree tandem was placed with inside the cervical sleeve.  Excellent position was noted on the transabdominal ultrasound images.  Patient then had placement of a 45 degree cervical ring with a large  shielding cap in place.  This was followed by a rectal paddle posteriorly.  There was no additional room for vaginal packing.  She tolerated the procedure well and was subsequently transported to the recovery room in stable condition.  Later in the day the patient will proceed with her second high-dose-rate treatment.  Plan is for the patient to receive 5.5 Gy to the high risk clinical target volume at the cervical region   Transvaginal ultrasound measurements of the cervix from the OR procedure last week, 01/26/2024 are as follows  Length 1.9 cm  Height 2.8 cm  With 3.5 cm  PLAN OF CARE:  Transfer to radiation oncology for planning and treatment  PATIENT DISPOSITION:  PACU - hemodynamically stable.   Delay start of Pharmacological VTE agent (>24hrs) due to surgical blood loss or risk of bleeding: not applicable

## 2024-02-03 NOTE — Transfer of Care (Signed)
 Immediate Anesthesia Transfer of Care Note  Patient: Kelly Thomas  Procedure(s) Performed: INSERTION, UTERINE TANDEM AND RING OR CYLINDER, FOR BRACHYTHERAPY (Vagina ) [place] INTRAOPERATIVE (Abdomen)  Patient Location: PACU  Anesthesia Type:General  Level of Consciousness: awake and drowsy  Airway & Oxygen Therapy: Patient Spontanous Breathing and Patient connected to face mask oxygen  Post-op Assessment: Report given to RN and Post -op Vital signs reviewed and stable  Post vital signs: Reviewed and stable  Last Vitals:  Vitals Value Taken Time  BP 107/56 02/03/24 0817  Temp 36.8 C 02/03/24 0817  Pulse 59 02/03/24 0821  Resp 13 02/03/24 0821  SpO2 100 % 02/03/24 0821  Vitals shown include unfiled device data.  Last Pain:  Vitals:   02/03/24 0817  TempSrc:   PainSc: Asleep         Complications: No notable events documented.

## 2024-02-03 NOTE — Patient Instructions (Signed)
 IMMEDIATELY FOLLOWING SURGERY: Do not drive or operate machinery for the first twenty four hours after surgery. Do not make any important decisions for twenty four hours after surgery or while taking narcotic pain medications or sedatives. If you develop intractable nausea and vomiting or a severe headache please notify your doctor immediately.   FOLLOW-UP: You do not need to follow up with anesthesia unless specifically instructed to do so.   WOUND CARE INSTRUCTIONS (if applicable): Expect some mild vaginal bleeding, but if large amount of bleeding occurs please contact Dr. Roselind Messier at [phone] or the Radiation On-Call physician. Call for any fever greater than 101.0 degrees or increasing vaginal//abdominal pain or trouble urinating.   QUESTIONS?: Please feel free to call your physician or the hospital operator if you have any questions, and they will be happy to assist you. Resume all medications: as listed on your after visit summary. Your next appointment is:  Future Appointments  Date Time Provider Department Center  02/10/2024  7:00 AM [facility] 1 [facility] [place]  02/10/2024  9:30 AM Antony Blackbird, MD [facility] None  02/10/2024 11:00 AM [facility] NURSE [facility] None  02/10/2024  2:00 PM Antony Blackbird, MD [facility] None  03/18/2024  9:00 AM [facility] FLUSH [facility] None  03/18/2024  9:40 AM Artis Delay, MD [facility] None

## 2024-02-04 ENCOUNTER — Encounter (HOSPITAL_COMMUNITY): Payer: Self-pay | Admitting: Radiation Oncology

## 2024-02-04 ENCOUNTER — Ambulatory Visit: Payer: Medicare Other

## 2024-02-05 ENCOUNTER — Ambulatory Visit: Payer: Medicare Other

## 2024-02-05 ENCOUNTER — Other Ambulatory Visit (HOSPITAL_COMMUNITY): Payer: Self-pay | Admitting: Radiation Oncology

## 2024-02-05 DIAGNOSIS — C530 Malignant neoplasm of endocervix: Secondary | ICD-10-CM

## 2024-02-05 NOTE — Progress Notes (Signed)
   Patient phoned to give updated information on surgery.   Date of Surgery - 02-10-24   Arrival Time - 5:15 AM and check in at admitting.     NPO Status - patient reminded to not eat solid food or drink liquids after midnight.     Medications morning of surgery - If needed Oxycodone, Ondansetron or Compazine with a sip of water   No change in medical history, allergies per patient.   Transportation home - [facility]ie [place] [phone]   All questions answered and patient stated understanding

## 2024-02-09 NOTE — Anesthesia Preprocedure Evaluation (Signed)
 Anesthesia Evaluation  Patient identified by MRN, date of birth, ID band Patient awake    Reviewed: Allergy & Precautions, NPO status , Patient's Chart, lab work & pertinent test results  History of Anesthesia Complications Negative for: history of anesthetic complications  Airway Mallampati: III  TM Distance: >3 FB Neck ROM: Full    Dental  (+) Dental Advisory Given, Missing,    Pulmonary neg pulmonary ROS   Pulmonary exam normal breath sounds clear to auscultation       Cardiovascular (-) hypertension(-) angina (-) Past MI, (-) Cardiac Stents and (-) CABG (-) dysrhythmias  Rhythm:Regular Rate:Normal  HLD   Neuro/Psych  PSYCHIATRIC DISORDERS Anxiety Depression    negative neurological ROS     GI/Hepatic negative GI ROS, Neg liver ROS,,,  Endo/Other  negative endocrine ROS    Renal/GU negative Renal ROS     Musculoskeletal   Abdominal   Peds  Hematology  (+) Blood dyscrasia (pancytopenia), anemia Lab Results      Component                Value               Date                      WBC                      2.4 (L)             01/20/2024                HGB                      11.7 (L)            01/20/2024                HCT                      35.4 (L)            01/20/2024                MCV                      97.3                01/20/2024                PLT                      142 (L)             01/20/2024              Anesthesia Other Findings   Reproductive/Obstetrics Cervical cancer                             Anesthesia Physical Anesthesia Plan  ASA: 2  Anesthesia Plan: General   Post-op Pain Management: Tylenol PO (pre-op)* and Toradol IV (intra-op)*   Induction: Intravenous  PONV Risk Score and Plan: 3 and Ondansetron, Dexamethasone and Treatment may vary due to age or medical condition  Airway Management Planned: LMA  Additional Equipment:   Intra-op Plan:    Post-operative Plan: Extubation in OR  Informed Consent: I have reviewed the patients History and Physical, chart, labs and  discussed the procedure including the risks, benefits and alternatives for the proposed anesthesia with the patient or authorized representative who has indicated his/her understanding and acceptance.     Dental advisory given  Plan Discussed with: CRNA and Anesthesiologist  Anesthesia Plan Comments: (Risks of general anesthesia discussed including, but not limited to, sore throat, hoarse voice, chipped/damaged teeth, injury to vocal cords, nausea and vomiting, allergic reactions, lung infection, heart attack, stroke, and death. All questions answered. )        Anesthesia Quick Evaluation

## 2024-02-09 NOTE — Progress Notes (Signed)
  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 Identity was confirmed. All relevant records and images related to the planned course of therapy were reviewed. The patient freely provided informed written consent to proceed with treatment after reviewing the details related to the planned course of therapy. The consent form was witnessed and verified by the simulation staff. Then, the patient was set-up in a stable reproducible supine position for radiation therapy. The tandem ring system was accessed and fiducial markers were placed within the tandem and ring.   Simple treatment device note: On the operating room table the patient had construction of her custom tandem ring system. She will be treated with a 45 tandem/ring system. The patient had placement of a 60 mm tandem. A cervical ring with a large shielding was used for her treatment. A rectal paddle was also part of her custom set up device.  Verification simulation note: An AP and lateral film was obtained through the pelvis area. This was compared to the patient's planning films documenting accurate position of the tandem/ring system for treatment.  High-dose-rate brachytherapy treatment note:   The remote afterloading device was accessed through catheter system and attached to the tandem ring system. Patient then proceeded to undergo her third high-dose-rate treatment directed at the cervix. The patient was prescribed a dose of 5.5 Gy to the HRCTV.Marland Kitchen Patient  was treated with 2 channels using 25 dwell positions. Treatment time was 493.5 seconds. The patient tolerated the procedure well. After completion of her therapy, a radiation survey was performed documenting return of the iridium source into the GammaMed safe. The patient was then transferred to the nursing suite.  She then had removal of the rectal paddle followed by the tandem and ring system. The patient tolerated the removal well.  PLAN: The patient will return next week to undergo her fourth treatment.  ________________________________   Billie Lade, PhD, MD  This document serves as a record of services personally performed by Antony Blackbird, MD. It was created on his behalf by Herbie Saxon, a trained medical scribe. The creation of this record is based on the scribe's personal observations and the provider's statements to them. This document has been checked and approved by the attending provider.

## 2024-02-10 ENCOUNTER — Ambulatory Visit
Admission: RE | Admit: 2024-02-10 | Discharge: 2024-02-10 | Disposition: A | Discharge status: Home / Self Care | Source: Ambulatory Visit | Attending: Radiation Oncology | Admitting: Radiation Oncology

## 2024-02-10 ENCOUNTER — Encounter (HOSPITAL_COMMUNITY): Payer: Self-pay | Admitting: Radiation Oncology

## 2024-02-10 ENCOUNTER — Encounter (HOSPITAL_COMMUNITY)
Admission: RE | Disposition: A | Discharge status: Home / Self Care | Payer: Self-pay | Source: Home / Self Care | Attending: Radiation Oncology

## 2024-02-10 ENCOUNTER — Ambulatory Visit (HOSPITAL_COMMUNITY): Admitting: Anesthesiology

## 2024-02-10 ENCOUNTER — Inpatient Hospital Stay

## 2024-02-10 ENCOUNTER — Other Ambulatory Visit: Payer: Self-pay

## 2024-02-10 ENCOUNTER — Ambulatory Visit (HOSPITAL_COMMUNITY)
Admission: RE | Admit: 2024-02-10 | Discharge: 2024-02-10 | Disposition: A | Discharge status: Home / Self Care | Attending: Radiation Oncology | Admitting: Radiation Oncology

## 2024-02-10 ENCOUNTER — Ambulatory Visit (HOSPITAL_BASED_OUTPATIENT_CLINIC_OR_DEPARTMENT_OTHER): Admitting: Anesthesiology

## 2024-02-10 ENCOUNTER — Ambulatory Visit (HOSPITAL_COMMUNITY)
Admission: RE | Admit: 2024-02-10 | Discharge: 2024-02-10 | Disposition: A | Discharge status: Home / Self Care | Source: Ambulatory Visit | Attending: Radiation Oncology | Admitting: Radiation Oncology

## 2024-02-10 VITALS — BP 115/64 | HR 70 | Temp 96.8°F | Resp 14

## 2024-02-10 DIAGNOSIS — D649 Anemia, unspecified: Secondary | ICD-10-CM | POA: Insufficient documentation

## 2024-02-10 DIAGNOSIS — R634 Abnormal weight loss: Secondary | ICD-10-CM | POA: Diagnosis not present

## 2024-02-10 DIAGNOSIS — R110 Nausea: Secondary | ICD-10-CM | POA: Diagnosis not present

## 2024-02-10 DIAGNOSIS — Z79899 Other long term (current) drug therapy: Secondary | ICD-10-CM | POA: Diagnosis not present

## 2024-02-10 DIAGNOSIS — C530 Malignant neoplasm of endocervix: Secondary | ICD-10-CM | POA: Diagnosis present

## 2024-02-10 DIAGNOSIS — H9319 Tinnitus, unspecified ear: Secondary | ICD-10-CM | POA: Diagnosis not present

## 2024-02-10 DIAGNOSIS — Z95828 Presence of other vascular implants and grafts: Secondary | ICD-10-CM | POA: Insufficient documentation

## 2024-02-10 DIAGNOSIS — F32A Depression, unspecified: Secondary | ICD-10-CM | POA: Diagnosis not present

## 2024-02-10 DIAGNOSIS — F419 Anxiety disorder, unspecified: Secondary | ICD-10-CM | POA: Diagnosis not present

## 2024-02-10 DIAGNOSIS — E785 Hyperlipidemia, unspecified: Secondary | ICD-10-CM | POA: Insufficient documentation

## 2024-02-10 DIAGNOSIS — D61818 Other pancytopenia: Secondary | ICD-10-CM | POA: Diagnosis not present

## 2024-02-10 DIAGNOSIS — C538 Malignant neoplasm of overlapping sites of cervix uteri: Secondary | ICD-10-CM

## 2024-02-10 LAB — RAD ONC ARIA SESSION SUMMARY
Course Elapsed Days: 61
Plan Fractions Treated to Date: 1
Plan Prescribed Dose Per Fraction: 5.5 Gy
Plan Total Fractions Prescribed: 1
Plan Total Prescribed Dose: 5.5 Gy
Reference Point Dosage Given to Date: 11.4834 Gy
Reference Point Dosage Given to Date: 11.8791 Gy
Reference Point Dosage Given to Date: 16.5 Gy
Reference Point Dosage Given to Date: 24.8539 Gy
Reference Point Dosage Given to Date: 25.4757 Gy
Reference Point Session Dosage Given: 3.269 Gy
Reference Point Session Dosage Given: 3.4506 Gy
Reference Point Session Dosage Given: 5.5 Gy
Reference Point Session Dosage Given: 6.9452 Gy
Reference Point Session Dosage Given: 7.4418 Gy
Session Number: 33

## 2024-02-10 LAB — CBC WITH DIFFERENTIAL (CANCER CENTER ONLY)
Abs Immature Granulocytes: 0.01 10*3/uL (ref 0.00–0.07)
Basophils Absolute: 0 10*3/uL (ref 0.0–0.1)
Basophils Relative: 1 %
Eosinophils Absolute: 0 10*3/uL (ref 0.0–0.5)
Eosinophils Relative: 0 %
HCT: 30.2 % — ABNORMAL LOW (ref 36.0–46.0)
Hemoglobin: 10.4 g/dL — ABNORMAL LOW (ref 12.0–15.0)
Immature Granulocytes: 1 %
Lymphocytes Relative: 9 %
Lymphs Abs: 0.1 10*3/uL — ABNORMAL LOW (ref 0.7–4.0)
MCH: 32.9 pg (ref 26.0–34.0)
MCHC: 34.4 g/dL (ref 30.0–36.0)
MCV: 95.6 fL (ref 80.0–100.0)
Monocytes Absolute: 0 10*3/uL — ABNORMAL LOW (ref 0.1–1.0)
Monocytes Relative: 4 %
Neutro Abs: 1 10*3/uL — ABNORMAL LOW (ref 1.7–7.7)
Neutrophils Relative %: 85 %
Platelet Count: 172 10*3/uL (ref 150–400)
RBC: 3.16 MIL/uL — ABNORMAL LOW (ref 3.87–5.11)
RDW: 18.6 % — ABNORMAL HIGH (ref 11.5–15.5)
WBC Count: 1.1 10*3/uL — ABNORMAL LOW (ref 4.0–10.5)
nRBC: 0 % (ref 0.0–0.2)

## 2024-02-10 LAB — BASIC METABOLIC PANEL - CANCER CENTER ONLY
Anion gap: 6 (ref 5–15)
BUN: 8 mg/dL (ref 8–23)
CO2: 28 mmol/L (ref 22–32)
Calcium: 8.8 mg/dL — ABNORMAL LOW (ref 8.9–10.3)
Chloride: 106 mmol/L (ref 98–111)
Creatinine: 0.6 mg/dL (ref 0.44–1.00)
GFR, Estimated: >60 mL/min (ref >60)
Glucose, Bld: 152 mg/dL — ABNORMAL HIGH (ref 70–99)
Potassium: 3.8 mmol/L (ref 3.5–5.1)
Sodium: 140 mmol/L (ref 135–145)

## 2024-02-10 SURGERY — INSERTION, UTERINE TANDEM AND RING OR CYLINDER, FOR BRACHYTHERAPY
Anesthesia: General

## 2024-02-10 MED ORDER — LACTATED RINGERS IV SOLN: INTRAVENOUS | Status: DC | PRN

## 2024-02-10 MED ORDER — ONDANSETRON HCL 4 MG/2ML IJ SOLN
INTRAMUSCULAR | Status: AC
  Filled 2024-02-10: qty 2

## 2024-02-10 MED ORDER — HEPARIN SOD (PORK) LOCK FLUSH 100 UNIT/ML IV SOLN
500.0000 [IU] | Freq: Once | INTRAVENOUS | Status: AC
  Administered 2024-02-10: 500 [IU] via INTRAVENOUS

## 2024-02-10 MED ORDER — HYDROMORPHONE HCL 1 MG/ML IJ SOLN
INTRAMUSCULAR | Status: AC
  Filled 2024-02-10: qty 1

## 2024-02-10 MED ORDER — OXYCODONE HCL 5 MG PO TABS: 5.0000 mg | ORAL_TABLET | Freq: Once | ORAL | Status: DC | PRN

## 2024-02-10 MED ORDER — OXYCODONE HCL 5 MG/5ML PO SOLN: 5.0000 mg | Freq: Once | ORAL | Status: DC | PRN

## 2024-02-10 MED ORDER — LIDOCAINE HCL (PF) 2 % IJ SOLN
INTRAMUSCULAR | Status: DC | PRN
  Administered 2024-02-10: 60 mg via INTRADERMAL

## 2024-02-10 MED ORDER — CHLORHEXIDINE GLUCONATE 0.12 % MT SOLN
15.0000 mL | Freq: Once | OROMUCOSAL | Status: AC
  Administered 2024-02-10: 15 mL via OROMUCOSAL

## 2024-02-10 MED ORDER — STERILE WATER FOR IRRIGATION IR SOLN
Status: DC | PRN
  Administered 2024-02-10: 3000 mL

## 2024-02-10 MED ORDER — DEXAMETHASONE SODIUM PHOSPHATE 10 MG/ML IJ SOLN
INTRAMUSCULAR | Status: DC | PRN
  Administered 2024-02-10: 8 mg via INTRAVENOUS

## 2024-02-10 MED ORDER — HYDROMORPHONE HCL 1 MG/ML IJ SOLN
0.5000 mg | Freq: Once | INTRAMUSCULAR | Status: AC
  Administered 2024-02-10: 0.5 mg via INTRAVENOUS

## 2024-02-10 MED ORDER — MIDAZOLAM HCL 2 MG/2ML IJ SOLN
INTRAMUSCULAR | Status: AC
  Filled 2024-02-10: qty 2

## 2024-02-10 MED ORDER — FENTANYL CITRATE PF 50 MCG/ML IJ SOSY: 25.0000 ug | PREFILLED_SYRINGE | INTRAMUSCULAR | Status: DC | PRN

## 2024-02-10 MED ORDER — MIDAZOLAM HCL 2 MG/2ML IJ SOLN
INTRAMUSCULAR | Status: DC | PRN
  Administered 2024-02-10: 2 mg via INTRAVENOUS

## 2024-02-10 MED ORDER — FENTANYL CITRATE (PF) 100 MCG/2ML IJ SOLN
INTRAMUSCULAR | Status: AC
  Filled 2024-02-10: qty 2

## 2024-02-10 MED ORDER — LIDOCAINE HCL (PF) 2 % IJ SOLN
INTRAMUSCULAR | Status: AC
  Filled 2024-02-10: qty 5

## 2024-02-10 MED ORDER — LACTATED RINGERS IV SOLN
INTRAVENOUS | Status: DC
  Filled 2024-02-10: qty 1000

## 2024-02-10 MED ORDER — EPHEDRINE 5 MG/ML INJ
INTRAVENOUS | Status: AC
  Filled 2024-02-10: qty 5

## 2024-02-10 MED ORDER — FENTANYL CITRATE (PF) 100 MCG/2ML IJ SOLN
INTRAMUSCULAR | Status: DC | PRN
  Administered 2024-02-10: 50 ug via INTRAVENOUS

## 2024-02-10 MED ORDER — DEXAMETHASONE SODIUM PHOSPHATE 10 MG/ML IJ SOLN
INTRAMUSCULAR | Status: AC
  Filled 2024-02-10: qty 1

## 2024-02-10 MED ORDER — ORAL CARE MOUTH RINSE: 15.0000 mL | Freq: Once | OROMUCOSAL | Status: AC

## 2024-02-10 MED ORDER — PROPOFOL 10 MG/ML IV BOLUS
INTRAVENOUS | Status: AC
  Filled 2024-02-10: qty 20

## 2024-02-10 MED ORDER — EPHEDRINE SULFATE-NACL 50-0.9 MG/10ML-% IV SOSY
PREFILLED_SYRINGE | INTRAVENOUS | Status: DC | PRN
  Administered 2024-02-10: 10 mg via INTRAVENOUS

## 2024-02-10 MED ORDER — AMISULPRIDE (ANTIEMETIC) 5 MG/2ML IV SOLN: 10.0000 mg | Freq: Once | INTRAVENOUS | Status: DC | PRN

## 2024-02-10 MED ORDER — SODIUM CHLORIDE 0.9% FLUSH
10.0000 mL | INTRAVENOUS | Status: DC | PRN
Start: 2024-02-10 — End: 2024-02-11
  Administered 2024-02-10: 10 mL via INTRAVENOUS

## 2024-02-10 MED ORDER — ESTRADIOL 0.1 MG/GM VA CREA
TOPICAL_CREAM | VAGINAL | Status: DC | PRN
Start: 2024-02-10 — End: 2024-02-12
  Administered 2024-02-10: 1 via VAGINAL

## 2024-02-10 MED ORDER — ESTRADIOL 0.1 MG/GM VA CREA
TOPICAL_CREAM | VAGINAL | Status: AC
  Filled 2024-02-10: qty 42.5

## 2024-02-10 MED ORDER — SODIUM CHLORIDE 0.9% FLUSH
10.0000 mL | Freq: Once | INTRAVENOUS | Status: AC
  Administered 2024-02-10: 10 mL via INTRAVENOUS

## 2024-02-10 MED ORDER — PROPOFOL 10 MG/ML IV BOLUS
INTRAVENOUS | Status: DC | PRN
  Administered 2024-02-10: 130 mg via INTRAVENOUS

## 2024-02-10 MED ORDER — ACETAMINOPHEN 500 MG PO TABS
1000.0000 mg | ORAL_TABLET | Freq: Once | ORAL | Status: AC
  Administered 2024-02-10: 1000 mg via ORAL
  Filled 2024-02-10: qty 2

## 2024-02-10 MED ORDER — PHENYLEPHRINE 80 MCG/ML (10ML) SYRINGE FOR IV PUSH (FOR BLOOD PRESSURE SUPPORT)
PREFILLED_SYRINGE | INTRAVENOUS | Status: DC | PRN
  Administered 2024-02-10: 80 ug via INTRAVENOUS

## 2024-02-10 MED ORDER — ONDANSETRON HCL 4 MG/2ML IJ SOLN
INTRAMUSCULAR | Status: DC | PRN
  Administered 2024-02-10: 4 mg via INTRAVENOUS

## 2024-02-10 SURGICAL SUPPLY — 30 items
BAG COUNTER SPONGE SURGICOUNT (BAG) IMPLANT
BAG URINE DRAIN 2000ML AR STRL (UROLOGICAL SUPPLIES) ×1 IMPLANT
CATH FOLEY 2WAY SLVR 5CC 16FR (CATHETERS) IMPLANT
CATH FOLEY 3WAY 5CC 16FR (CATHETERS) IMPLANT
COVER BACK TABLE 60X90IN (DRAPES) ×1 IMPLANT
COVER SURGICAL LIGHT HANDLE (MISCELLANEOUS) ×1 IMPLANT
DILATOR CANAL MILEX (MISCELLANEOUS) IMPLANT
DRAPE SHEET LG 3/4 BI-LAMINATE (DRAPES) ×1 IMPLANT
DRAPE UNDERBUTTOCKS STRL (DISPOSABLE) ×1 IMPLANT
GAUZE 4X4 16PLY ~~LOC~~+RFID DBL (SPONGE) ×1 IMPLANT
GAUZE PAD ABD 8X10 STRL (GAUZE/BANDAGES/DRESSINGS) ×2 IMPLANT
GLOVE BIO SURGEON STRL SZ 6 (GLOVE) ×2 IMPLANT
GLOVE BIO SURGEON STRL SZ7.5 (GLOVE) ×2 IMPLANT
GOWN STRL REUS W/ TWL LRG LVL3 (GOWN DISPOSABLE) ×1 IMPLANT
KIT BASIN OR (CUSTOM PROCEDURE TRAY) ×1 IMPLANT
KIT TURNOVER KIT A (KITS) ×1 IMPLANT
LEGGING LITHOTOMY PAIR STRL (DRAPES) ×1 IMPLANT
MAT PREVALON FULL STRYKER (MISCELLANEOUS) ×1 IMPLANT
PACKING VAGINAL (PACKING) ×1 IMPLANT
PENCIL SMOKE EVACUATOR (MISCELLANEOUS) IMPLANT
PLUG CATH AND CAP STRL 200 (CATHETERS) IMPLANT
SET IRRIG Y TYPE TUR BLADDER L (SET/KITS/TRAYS/PACK) IMPLANT
SURGILUBE 2OZ TUBE FLIPTOP (MISCELLANEOUS) ×1 IMPLANT
SUT PROLENE 2 0 CT2 30 (SUTURE) IMPLANT
SUT VIC AB 0 CT1 27XBRD ANTBC (SUTURE) IMPLANT
SYR 10ML LL (SYRINGE) ×1 IMPLANT
TOWEL OR 17X26 10 PK STRL BLUE (TOWEL DISPOSABLE) ×1 IMPLANT
UNDERPAD 30X36 HEAVY ABSORB (UNDERPADS AND DIAPERS) ×2 IMPLANT
WATER STERILE IRR 500ML POUR (IV SOLUTION) ×1 IMPLANT
YANKAUER SUCT BULB TIP 10FT TU (MISCELLANEOUS) ×1 IMPLANT

## 2024-02-10 NOTE — Anesthesia Procedure Notes (Signed)
 Procedure Name: LMA Insertion Date/Time: 02/10/2024 7:48 AM  Performed by: Micki Riley, CRNAPre-anesthesia Checklist: Patient identified, Emergency Drugs available, Suction available and Patient being monitored Patient Re-evaluated:Patient Re-evaluated prior to induction Oxygen Delivery Method: Circle System Utilized Preoxygenation: Pre-oxygenation with 100% oxygen Induction Type: IV induction Ventilation: Mask ventilation without difficulty LMA: LMA inserted LMA Size: 3.0 Number of attempts: 1 Airway Equipment and Method: Bite block Placement Confirmation: positive ETCO2 Tube secured with: Tape Dental Injury: Teeth and Oropharynx as per pre-operative assessment

## 2024-02-10 NOTE — Anesthesia Postprocedure Evaluation (Signed)
 Anesthesia Post Note  Patient: Kelly Thomas  Procedure(s) Performed: INSERTION, UTERINE TANDEM AND RING, FOR BRACHYTHERAPY [place] INTRAOPERATIVE     Patient location during evaluation: PACU Anesthesia Type: General Level of consciousness: awake Pain management: pain level controlled Vital Signs Assessment: post-procedure vital signs reviewed and stable Respiratory status: spontaneous breathing, nonlabored ventilation and respiratory function stable Cardiovascular status: blood pressure returned to baseline and stable Postop Assessment: no apparent nausea or vomiting Anesthetic complications: no   No notable events documented.  Last Vitals:  Vitals:   02/10/24 0845 02/10/24 0900  BP: (!) 118/59 (!) 119/59  Pulse: 75 72  Resp: 14 14  Temp:    SpO2: 99% 99%    Last Pain:  Vitals:   02/10/24 0900  TempSrc:   PainSc: 3                  Linton Rump

## 2024-02-10 NOTE — Op Note (Signed)
 02/10/2024  9:02 AM  PATIENT:  Kelly Thomas  68 y.o. female  PRE-OPERATIVE DIAGNOSIS:  MALIGNANT NEOPLASM OF ENDOCERVIX  POST-OPERATIVE DIAGNOSIS:  MALIGNANT NEOPLASM OF ENDOCERVIX  PROCEDURE:  Procedure(s): INSERTION, UTERINE TANDEM AND RING OR CYLINDER, FOR BRACHYTHERAPY (N/A) [place] INTRAOPERATIVE (N/A)  SURGEON:  Surgeons and Role:    * Antony Blackbird, MD - Primary  PHYSICIAN ASSISTANT:   ASSISTANTS: none   ANESTHESIA:   general  EBL:  2 mL  BLOOD ADMINISTERED:none  DRAINS: Urinary Catheter (Foley)   LOCAL MEDICATIONS USED:  NONE  SPECIMEN:  No Specimen  DISPOSITION OF SPECIMEN:  N/A  COUNTS:  YES  TOURNIQUET:  * No tourniquets in log *  DICTATION: The patient was prepped and draped in the usual sterile fashion and placed in the dorsolithotomy position.  Timeout for the procedure, preoperative medications and estimated length of the procedure was performed.  A Foley catheter was placed and backfilled with approximately 200 cc of saline for ultrasound imaging purposes.  She then proceeded to undergo exam under anesthesia.  The cervical mass responded very well to external beam radiation treatment.  The estimated size of the cervix was approximately 2-1/2 x 2.5 cm.  The right lateral fornice was somewhat limited but no obvious parametrial involvement.  The patient then underwent sounding and dilation of the cervical os.  The estimated length of the uterus was approximately 6.9 cm.  She was noted to have a large fibroid in the left posterior location of the uterus.  On transabdominal ultrasound this fibroid measured approximately 2.5 cm in greatest dimension.  After dilation of the cervix, a 60 mm cervical sleeve was placed.  A 60 mm 45 degree tandem was placed with inside the cervical sleeve.  Excellent position was noted on the transabdominal ultrasound images.  Patient then had placement of a 45 degree cervical ring with a large shielding cap in place.  This was followed by a  rectal paddle posteriorly.  I attempted to place vaginal packing soaked in Estrace cream but there was no additional room in the vaginal vault. She tolerated the procedure well and was subsequently transported to the recovery room in stable condition.  Later in the day the patient will proceed with her third high-dose-rate treatment.  Plan is for the patient to receive 5.5 Gy to the high risk clinical target volume within the cervical region   PLAN OF CARE:  Transfer to radiation oncology for planning and treatment  PATIENT DISPOSITION:  PACU - hemodynamically stable.   Delay start of Pharmacological VTE agent (>24hrs) due to surgical blood loss or risk of bleeding: not applicable

## 2024-02-10 NOTE — Progress Notes (Signed)
 IMMEDIATELY FOLLOWING SURGERY: Do not drive or operate machinery for the first twenty four hours after surgery. Do not make any important decisions for twenty four hours after surgery or while taking narcotic pain medications or sedatives. If you develop intractable nausea and vomiting or a severe headache please notify your doctor immediately.   FOLLOW-UP: You do not need to follow up with anesthesia unless specifically instructed to do so.   WOUND CARE INSTRUCTIONS (if applicable): Expect some mild vaginal bleeding, but if large amount of bleeding occurs please contact Dr. Roselind Messier at [phone] or the Radiation On-Call physician. Call for any fever greater than 101.0 degrees or increasing vaginal//abdominal pain or trouble urinating.   QUESTIONS?: Please feel free to call your physician or the hospital operator if you have any questions, and they will be happy to assist you. Resume all medications: as listed on your after visit summary. Your next appointment is:  Future Appointments  Date Time Provider Department Center  02/10/2024 11:00 AM [facility] NURSE [facility] None  02/10/2024  2:00 PM Antony Blackbird, MD [facility] None  02/17/2024  7:00 AM [facility] 1 [facility] [place]  02/17/2024  2:00 PM [facility] NURSE [facility] None  02/17/2024  2:30 PM Antony Blackbird, MD [facility] None  02/17/2024  3:30 PM Antony Blackbird, MD [facility] None  03/18/2024  9:00 AM [facility] FLUSH [facility] None  03/18/2024  9:40 AM Gorsuch, Ni, MD [facility] None     BP 115/64   Temp (!) 96.8 F (36 C) (Temporal)   Resp 14   SpO2 100%  Pule 70

## 2024-02-10 NOTE — Interval H&P Note (Signed)
 History and Physical Interval Note:  02/10/2024 7:22 AM  Kelly Thomas  has presented today for surgery, with the diagnosis of MALIGNANT NEOPLASM OF ENDOCERVIX.  The various methods of treatment have been discussed with the patient and family. After consideration of risks, benefits and other options for treatment, the patient has consented to  Procedure(s): INSERTION, UTERINE TANDEM AND RING OR CYLINDER, FOR BRACHYTHERAPY (N/A) [place] INTRAOPERATIVE (N/A) as a surgical intervention.  The patient's history has been reviewed, patient examined, no change in status, stable for surgery.  I have reviewed the patient's chart and labs.  Questions were answered to the patient's satisfaction.     Antony Blackbird

## 2024-02-10 NOTE — Transfer of Care (Signed)
 Immediate Anesthesia Transfer of Care Note  Patient: Kelly Thomas  Procedure(s) Performed: INSERTION, UTERINE TANDEM AND RING OR CYLINDER, FOR BRACHYTHERAPY [place] INTRAOPERATIVE  Patient Location: PACU  Anesthesia Type:General  Level of Consciousness: awake, alert , and oriented  Airway & Oxygen Therapy: Patient Spontanous Breathing and Patient connected to nasal cannula oxygen  Post-op Assessment: Report given to RN and Post -op Vital signs reviewed and stable  Post vital signs: Reviewed and stable  Last Vitals:  Vitals Value Taken Time  BP 112/58 02/10/24 0831  Temp    Pulse 77 02/10/24 0833  Resp 14 02/10/24 0833  SpO2 100 % 02/10/24 0833  Vitals shown include unfiled device data.  Last Pain:  Vitals:   02/10/24 0606  TempSrc: Oral  PainSc:          Complications: No notable events documented.

## 2024-02-11 ENCOUNTER — Encounter (HOSPITAL_COMMUNITY): Payer: Self-pay | Admitting: Radiation Oncology

## 2024-02-12 NOTE — Progress Notes (Signed)
 Surgery orders requested via Epic inbox.

## 2024-02-12 NOTE — Progress Notes (Signed)
      Patient phoned to give updated information on surgery.   Date of Surgery - 02-17-24   Arrival Time - 5:15 AM and check in at admitting.     NPO Status - patient reminded to not eat solid food or drink liquids after midnight.     Medications morning of surgery - Atorvastatin.  If needed Oxycodone, Ondansetron or Compazine with a sip of water   No change in medical history, allergies per patient.   Transportation home - Ziggy Reveles [phone]   All questions answered and patient stated understanding

## 2024-02-16 ENCOUNTER — Other Ambulatory Visit (HOSPITAL_COMMUNITY): Payer: Self-pay | Admitting: Radiation Oncology

## 2024-02-16 DIAGNOSIS — C530 Malignant neoplasm of endocervix: Secondary | ICD-10-CM

## 2024-02-17 ENCOUNTER — Ambulatory Visit (HOSPITAL_BASED_OUTPATIENT_CLINIC_OR_DEPARTMENT_OTHER): Admitting: Anesthesiology

## 2024-02-17 ENCOUNTER — Ambulatory Visit (HOSPITAL_COMMUNITY): Admitting: Anesthesiology

## 2024-02-17 ENCOUNTER — Ambulatory Visit (HOSPITAL_COMMUNITY)
Admission: RE | Admit: 2024-02-17 | Discharge: 2024-02-17 | Disposition: A | Discharge status: Home / Self Care | Source: Ambulatory Visit | Attending: Radiation Oncology | Admitting: Radiation Oncology

## 2024-02-17 ENCOUNTER — Ambulatory Visit
Admission: RE | Admit: 2024-02-17 | Discharge: 2024-02-17 | Disposition: A | Discharge status: Home / Self Care | Source: Ambulatory Visit | Attending: Radiation Oncology | Admitting: Radiation Oncology

## 2024-02-17 ENCOUNTER — Encounter (HOSPITAL_COMMUNITY): Payer: Self-pay | Admitting: Radiation Oncology

## 2024-02-17 ENCOUNTER — Encounter (HOSPITAL_COMMUNITY)
Admission: RE | Disposition: A | Discharge status: Home / Self Care | Payer: Self-pay | Source: Home / Self Care | Attending: Radiation Oncology

## 2024-02-17 ENCOUNTER — Ambulatory Visit
Admission: RE | Admit: 2024-02-17 | Discharge: 2024-02-17 | Discharge status: Home / Self Care | Source: Ambulatory Visit | Attending: Radiation Oncology | Admitting: Radiation Oncology

## 2024-02-17 ENCOUNTER — Ambulatory Visit (HOSPITAL_COMMUNITY)
Admission: RE | Admit: 2024-02-17 | Discharge: 2024-02-17 | Disposition: A | Discharge status: Home / Self Care | Attending: Radiation Oncology | Admitting: Radiation Oncology

## 2024-02-17 ENCOUNTER — Other Ambulatory Visit: Payer: Self-pay

## 2024-02-17 VITALS — BP 113/63 | HR 69 | Temp 97.2°F | Resp 16

## 2024-02-17 DIAGNOSIS — C530 Malignant neoplasm of endocervix: Secondary | ICD-10-CM

## 2024-02-17 DIAGNOSIS — D649 Anemia, unspecified: Secondary | ICD-10-CM

## 2024-02-17 DIAGNOSIS — C538 Malignant neoplasm of overlapping sites of cervix uteri: Secondary | ICD-10-CM

## 2024-02-17 HISTORY — DX: Anemia, unspecified: D64.9

## 2024-02-17 LAB — CBC WITH DIFFERENTIAL/PLATELET
Abs Immature Granulocytes: 0.07 10*3/uL (ref 0.00–0.07)
Basophils Absolute: 0 10*3/uL (ref 0.0–0.1)
Basophils Relative: 2 %
Eosinophils Absolute: 0 10*3/uL (ref 0.0–0.5)
Eosinophils Relative: 2 %
HCT: 32.1 % — ABNORMAL LOW (ref 36.0–46.0)
Hemoglobin: 10.4 g/dL — ABNORMAL LOW (ref 12.0–15.0)
Immature Granulocytes: 4 %
Lymphocytes Relative: 16 %
Lymphs Abs: 0.3 10*3/uL — ABNORMAL LOW (ref 0.7–4.0)
MCH: 33.9 pg (ref 26.0–34.0)
MCHC: 32.4 g/dL (ref 30.0–36.0)
MCV: 104.6 fL — ABNORMAL HIGH (ref 80.0–100.0)
Monocytes Absolute: 0.4 10*3/uL (ref 0.1–1.0)
Monocytes Relative: 18 %
Neutro Abs: 1.1 10*3/uL — ABNORMAL LOW (ref 1.7–7.7)
Neutrophils Relative %: 58 %
Platelets: 201 10*3/uL (ref 150–400)
RBC: 3.07 MIL/uL — ABNORMAL LOW (ref 3.87–5.11)
RDW: 18.9 % — ABNORMAL HIGH (ref 11.5–15.5)
WBC: 1.9 10*3/uL — ABNORMAL LOW (ref 4.0–10.5)
nRBC: 0 % (ref 0.0–0.2)

## 2024-02-17 LAB — RAD ONC ARIA SESSION SUMMARY
Course Elapsed Days: 68
Plan Fractions Treated to Date: 1
Plan Prescribed Dose Per Fraction: 5.5 Gy
Plan Total Fractions Prescribed: 1
Plan Total Prescribed Dose: 5.5 Gy
Reference Point Dosage Given to Date: 15.243 Gy
Reference Point Dosage Given to Date: 15.541 Gy
Reference Point Dosage Given to Date: 22 Gy
Reference Point Dosage Given to Date: 32.4068 Gy
Reference Point Dosage Given to Date: 32.8431 Gy
Reference Point Session Dosage Given: 3.6619 Gy
Reference Point Session Dosage Given: 3.7596 Gy
Reference Point Session Dosage Given: 5.5 Gy
Reference Point Session Dosage Given: 7.3674 Gy
Reference Point Session Dosage Given: 7.5529 Gy
Session Number: 34

## 2024-02-17 SURGERY — INSERTION, UTERINE TANDEM AND RING OR CYLINDER, FOR BRACHYTHERAPY
Anesthesia: General

## 2024-02-17 MED ORDER — ESTRADIOL 0.1 MG/GM VA CREA
TOPICAL_CREAM | VAGINAL | Status: AC
  Filled 2024-02-17: qty 42.5

## 2024-02-17 MED ORDER — PROPOFOL 10 MG/ML IV BOLUS
INTRAVENOUS | Status: AC
  Filled 2024-02-17: qty 20

## 2024-02-17 MED ORDER — OXYCODONE HCL 5 MG/5ML PO SOLN: 5.0000 mg | Freq: Once | ORAL | Status: DC | PRN

## 2024-02-17 MED ORDER — PHENYLEPHRINE HCL (PRESSORS) 10 MG/ML IV SOLN
INTRAVENOUS | Status: DC | PRN
  Administered 2024-02-17 (×2): 80 ug via INTRAVENOUS
  Administered 2024-02-17: 100 ug via INTRAVENOUS

## 2024-02-17 MED ORDER — ONDANSETRON HCL 4 MG/2ML IJ SOLN
INTRAMUSCULAR | Status: DC | PRN
  Administered 2024-02-17: 4 mg via INTRAVENOUS

## 2024-02-17 MED ORDER — HYDROMORPHONE HCL 1 MG/ML IJ SOLN
0.5000 mg | Freq: Once | INTRAMUSCULAR | Status: AC
  Administered 2024-02-17: 0.5 mg via INTRAVENOUS

## 2024-02-17 MED ORDER — SODIUM CHLORIDE 0.9 % IR SOLN
Status: DC | PRN
  Administered 2024-02-17: 200 mL via INTRAVESICAL

## 2024-02-17 MED ORDER — FENTANYL CITRATE PF 50 MCG/ML IJ SOSY
25.0000 ug | PREFILLED_SYRINGE | INTRAMUSCULAR | Status: DC | PRN
  Administered 2024-02-17 (×2): 50 ug via INTRAVENOUS

## 2024-02-17 MED ORDER — ORAL CARE MOUTH RINSE: 15.0000 mL | Freq: Once | OROMUCOSAL | Status: AC

## 2024-02-17 MED ORDER — FENTANYL CITRATE PF 50 MCG/ML IJ SOSY
PREFILLED_SYRINGE | INTRAMUSCULAR | Status: AC
  Filled 2024-02-17: qty 2

## 2024-02-17 MED ORDER — FENTANYL CITRATE (PF) 100 MCG/2ML IJ SOLN
INTRAMUSCULAR | Status: AC
  Filled 2024-02-17: qty 2

## 2024-02-17 MED ORDER — LIDOCAINE HCL (CARDIAC) PF 100 MG/5ML IV SOSY
PREFILLED_SYRINGE | INTRAVENOUS | Status: DC | PRN
  Administered 2024-02-17: 60 mg via INTRAVENOUS

## 2024-02-17 MED ORDER — MIDAZOLAM HCL 2 MG/2ML IJ SOLN
INTRAMUSCULAR | Status: AC
Start: 2024-02-17 — End: ?
  Filled 2024-02-17: qty 2

## 2024-02-17 MED ORDER — ESTRADIOL 0.1 MG/GM VA CREA
TOPICAL_CREAM | VAGINAL | Status: DC | PRN
  Administered 2024-02-17: 1 via VAGINAL

## 2024-02-17 MED ORDER — OXYCODONE HCL 5 MG PO TABS: 5.0000 mg | ORAL_TABLET | Freq: Once | ORAL | Status: DC | PRN

## 2024-02-17 MED ORDER — DEXAMETHASONE SODIUM PHOSPHATE 10 MG/ML IJ SOLN
INTRAMUSCULAR | Status: AC
  Filled 2024-02-17: qty 1

## 2024-02-17 MED ORDER — ONDANSETRON HCL 4 MG/2ML IJ SOLN
INTRAMUSCULAR | Status: AC
  Filled 2024-02-17: qty 2

## 2024-02-17 MED ORDER — DEXAMETHASONE SODIUM PHOSPHATE 10 MG/ML IJ SOLN
INTRAMUSCULAR | Status: DC | PRN
  Administered 2024-02-17: 5 mg via INTRAVENOUS

## 2024-02-17 MED ORDER — HYDROMORPHONE HCL 1 MG/ML IJ SOLN
INTRAMUSCULAR | Status: AC
  Filled 2024-02-17: qty 1

## 2024-02-17 MED ORDER — ONDANSETRON HCL 4 MG/2ML IJ SOLN: 4.0000 mg | Freq: Four times a day (QID) | INTRAMUSCULAR | Status: DC | PRN

## 2024-02-17 MED ORDER — LIDOCAINE HCL (PF) 2 % IJ SOLN
INTRAMUSCULAR | Status: AC
  Filled 2024-02-17: qty 5

## 2024-02-17 MED ORDER — MIDAZOLAM HCL 2 MG/2ML IJ SOLN: 1.0000 mg | Freq: Once | INTRAMUSCULAR | Status: DC | PRN

## 2024-02-17 MED ORDER — SODIUM CHLORIDE 0.9 % IV SOLN
INTRAVENOUS | Status: DC
  Filled 2024-02-17 (×2): qty 150

## 2024-02-17 MED ORDER — PROPOFOL 10 MG/ML IV BOLUS
INTRAVENOUS | Status: DC | PRN
  Administered 2024-02-17: 100 mg via INTRAVENOUS

## 2024-02-17 MED ORDER — MIDAZOLAM HCL 5 MG/5ML IJ SOLN
INTRAMUSCULAR | Status: DC | PRN
  Administered 2024-02-17 (×2): 1 mg via INTRAVENOUS

## 2024-02-17 MED ORDER — LACTATED RINGERS IV SOLN
INTRAVENOUS | Status: DC
Start: 2024-02-17 — End: 2024-02-18
  Filled 2024-02-17: qty 1000

## 2024-02-17 MED ORDER — CHLORHEXIDINE GLUCONATE 0.12 % MT SOLN
15.0000 mL | Freq: Once | OROMUCOSAL | Status: AC
  Administered 2024-02-17: 15 mL via OROMUCOSAL

## 2024-02-17 MED ORDER — LACTATED RINGERS IV SOLN: INTRAVENOUS | Status: DC

## 2024-02-17 MED ORDER — FENTANYL CITRATE (PF) 100 MCG/2ML IJ SOLN
INTRAMUSCULAR | Status: DC | PRN
Start: 2024-02-17 — End: 2024-02-17
  Administered 2024-02-17: 50 ug via INTRAVENOUS
  Administered 2024-02-17: 25 ug via INTRAVENOUS

## 2024-02-17 SURGICAL SUPPLY — 30 items
BAG COUNTER SPONGE SURGICOUNT (BAG) IMPLANT
BAG URINE DRAIN 2000ML AR STRL (UROLOGICAL SUPPLIES) ×1 IMPLANT
CATH FOLEY 2WAY SLVR 5CC 16FR (CATHETERS) IMPLANT
CATH FOLEY 3WAY 5CC 16FR (CATHETERS) IMPLANT
COVER BACK TABLE 60X90IN (DRAPES) ×1 IMPLANT
COVER SURGICAL LIGHT HANDLE (MISCELLANEOUS) ×1 IMPLANT
DILATOR CANAL MILEX (MISCELLANEOUS) IMPLANT
DRAPE SHEET LG 3/4 BI-LAMINATE (DRAPES) ×1 IMPLANT
DRAPE UNDERBUTTOCKS STRL (DISPOSABLE) ×1 IMPLANT
GAUZE 4X4 16PLY ~~LOC~~+RFID DBL (SPONGE) ×1 IMPLANT
GAUZE PAD ABD 8X10 STRL (GAUZE/BANDAGES/DRESSINGS) ×2 IMPLANT
GLOVE BIO SURGEON STRL SZ 6 (GLOVE) ×2 IMPLANT
GLOVE BIO SURGEON STRL SZ7.5 (GLOVE) ×2 IMPLANT
GOWN STRL REUS W/ TWL LRG LVL3 (GOWN DISPOSABLE) ×1 IMPLANT
KIT BASIN OR (CUSTOM PROCEDURE TRAY) ×1 IMPLANT
KIT TURNOVER KIT A (KITS) ×1 IMPLANT
LEGGING LITHOTOMY PAIR STRL (DRAPES) ×1 IMPLANT
MAT PREVALON FULL STRYKER (MISCELLANEOUS) ×1 IMPLANT
PACKING VAGINAL (PACKING) ×1 IMPLANT
PENCIL SMOKE EVACUATOR (MISCELLANEOUS) IMPLANT
PLUG CATH AND CAP STRL 200 (CATHETERS) IMPLANT
SET IRRIG Y TYPE TUR BLADDER L (SET/KITS/TRAYS/PACK) IMPLANT
SURGILUBE 2OZ TUBE FLIPTOP (MISCELLANEOUS) ×1 IMPLANT
SUT PROLENE 2 0 CT2 30 (SUTURE) IMPLANT
SUT VIC AB 0 CT1 27XBRD ANTBC (SUTURE) IMPLANT
SYR 10ML LL (SYRINGE) ×1 IMPLANT
TOWEL OR 17X26 10 PK STRL BLUE (TOWEL DISPOSABLE) ×1 IMPLANT
UNDERPAD 30X36 HEAVY ABSORB (UNDERPADS AND DIAPERS) ×2 IMPLANT
WATER STERILE IRR 500ML POUR (IV SOLUTION) ×1 IMPLANT
YANKAUER SUCT BULB TIP 10FT TU (MISCELLANEOUS) ×1 IMPLANT

## 2024-02-17 NOTE — Anesthesia Postprocedure Evaluation (Signed)
 Anesthesia Post Note  Patient: [name]  Procedure(s) Performed: INSERTION, UTERINE TANDEM AND RING OR CYLINDER, FOR BRACHYTHERAPY [place] INTRAOPERATIVE     Patient location during evaluation: PACU Anesthesia Type: General Level of consciousness: awake and alert Pain management: pain level controlled Vital Signs Assessment: post-procedure vital signs reviewed and stable Respiratory status: spontaneous breathing, nonlabored ventilation, respiratory function stable and patient connected to nasal cannula oxygen Cardiovascular status: blood pressure returned to baseline and stable Postop Assessment: no apparent nausea or vomiting Anesthetic complications: no   No notable events documented.  Last Vitals:  Vitals:   02/17/24 0845 02/17/24 0900  BP: (!) 90/54 (!) 96/58  Pulse: 63 62  Resp: 13 11  Temp:  36.4 C  SpO2: 100% 100%    Last Pain:  Vitals:   02/17/24 0900  TempSrc:   PainSc: Asleep                 [name]

## 2024-02-17 NOTE — Progress Notes (Signed)
 Anesthesia Review:  PCP: Cardiologist :  PPM/ ICD: Device Orders: Rep Notified:  Chest x-ray : EKG : 01/20/24  Echo : Stress test: Cardiac Cath :   Activity level:  Sleep Study/ CPAP : Fasting Blood Sugar :      / Checks Blood Sugar -- times a day:    Blood Thinner/ Instructions /Last Dose: ASA / Instructions/ Last Dose :    Teleophone appt on 02/20/24 at 3pm.   Tandem Rings on 01/27/24 02/03/24 02/10/24 02/17/24 02/24/24

## 2024-02-17 NOTE — Patient Instructions (Signed)
 IMMEDIATELY FOLLOWING SURGERY: Do not drive or operate machinery for the first twenty four hours after surgery. Do not make any important decisions for twenty four hours after surgery or while taking narcotic pain medications or sedatives. If you develop intractable nausea and vomiting or a severe headache please notify your doctor immediately.   FOLLOW-UP: You do not need to follow up with anesthesia unless specifically instructed to do so.   WOUND CARE INSTRUCTIONS (if applicable): Expect some mild vaginal bleeding, but if large amount of bleeding occurs please contact Dr. Roselind Messier at [phone] or the Radiation On-Call physician. Call for any fever greater than 101.0 degrees or increasing vaginal//abdominal pain or trouble urinating.   QUESTIONS?: Please feel free to call your physician or the hospital operator if you have any questions, and they will be happy to assist you. Resume all medications: as listed on your after visit summary. Your next appointment is:  Future Appointments  Date Time Provider Department Center  02/17/2024  2:00 PM [facility] NURSE [facility] None  02/20/2024  3:00 PM [facility] PAT 2 [facility] None  02/24/2024 10:30 AM [facility]US 2 [facility]US [place]  02/24/2024  1:00 PM Antony Blackbird, MD [facility] None  02/24/2024  2:00 PM [facility] NURSE [facility] None  02/24/2024  3:00 PM Antony Blackbird, MD [facility] None  03/18/2024  9:00 AM [facility] FLUSH [facility] None  03/18/2024  9:40 AM Artis Delay, MD [facility] None

## 2024-02-17 NOTE — Progress Notes (Signed)
  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 Identity was confirmed. All relevant records and images related to the planned course of therapy were reviewed. The patient freely provided informed written consent to proceed with treatment after reviewing the details related to the planned course of therapy. The consent form was witnessed and verified by the simulation staff. Then, the patient was set-up in a stable reproducible supine position for radiation therapy. The tandem ring system was accessed and fiducial markers were placed within the tandem and ring.   Simple treatment device note: On the operating room table the patient had construction of her custom tandem ring system. She will be treated with a 45 tandem/ring system. The patient had placement of a 60 mm tandem. A cervical ring with a large shielding was used for her treatment. A rectal paddle was also part of her custom set up device.  Verification simulation note: An AP and lateral film was obtained through the pelvis area. This was compared to the patient's planning films documenting accurate position of the tandem/ring system for treatment.  High-dose-rate brachytherapy treatment note:   The remote afterloading device was accessed through catheter system and attached to the tandem ring system. Patient then proceeded to undergo her fourth high-dose-rate treatment directed at the cervix. The patient was prescribed a dose of 5.5 Gy to the HRCTV.Marland Kitchen  Patient was treated with 2 channels using 26 dwell positions. Treatment time was 583.9 seconds. The patient tolerated the procedure well. After completion of her therapy, a radiation survey was performed documenting return of the iridium source into the GammaMed safe. The patient was then transferred to the nursing suite.  She then had removal of the rectal paddle followed by the tandem and ring system. The patient tolerated the removal well.  PLAN: The patient will return next week to undergo her fifth treatment.  ________________________________   Billie Lade, PhD, MD  This document serves as a record of services personally performed by Antony Blackbird, MD. It was created on his behalf by Herbie Saxon, a trained medical scribe. The creation of this record is based on the scribe's personal observations and the provider's statements to them. This document has been checked and approved by the attending provider.

## 2024-02-17 NOTE — Op Note (Signed)
 02/17/2024  8:34 AM  PATIENT:  Kelly Thomas  68 y.o. female  PRE-OPERATIVE DIAGNOSIS:  endocervical cancer  POST-OPERATIVE DIAGNOSIS:  endocervical cancer  PROCEDURE:  Procedure(s): INSERTION, UTERINE TANDEM AND RING OR CYLINDER, FOR BRACHYTHERAPY (N/A) [place] INTRAOPERATIVE (N/A)  SURGEON:  Surgeons and Role:    * Antony Blackbird, MD - Primary  PHYSICIAN ASSISTANT:   ASSISTANTS: none   ANESTHESIA:   general  EBL:  0 mL   BLOOD ADMINISTERED:none  DRAINS: Urinary Catheter (Foley)   LOCAL MEDICATIONS USED:  NONE  SPECIMEN:  No Specimen  DISPOSITION OF SPECIMEN:  N/A  COUNTS:  YES  TOURNIQUET:  * No tourniquets in log *  DICTATION:  The patient was prepped and draped in the usual sterile fashion and placed in the dorsolithotomy position.  Timeout for the procedure, preoperative medications and estimated length of the procedure was performed.  A Foley catheter was placed and backfilled with approximately 200 cc of saline for ultrasound imaging purposes.  She then proceeded to undergo exam under anesthesia.  The cervical mass responded very well to external beam radiation treatment.  The estimated size of the cervix was approximately 2-1/2 x 2.5 cm.  The right lateral fornice was somewhat limited but no obvious parametrial involvement.  The patient then underwent sounding and dilation of the cervical os.  The estimated length of the uterus was approximately 6.9 cm.  She was noted to have a large fibroid in the left posterior location of the uterus.  On transabdominal ultrasound this fibroid measured approximately 2.5 cm in greatest dimension.  After dilation of the cervix, a 60 mm cervical sleeve was placed.  A 60 mm 45 degree tandem was placed with inside the cervical sleeve.  Excellent position was noted on the transabdominal ultrasound images.  Patient then had placement of a 45 degree cervical ring with a large shielding cap in place.  This was followed by a rectal paddle posteriorly.   I attempted to place vaginal packing soaked in Estrace cream but there was no additional room in the vaginal vault. She tolerated the procedure well and was subsequently transported to the recovery room in stable condition.  Later in the day the patient will proceed with her fourth high-dose-rate treatment.  Plan is for the patient to receive 5.5 Gy to the high risk clinical target volume within the cervical region   PLAN OF CARE:  Transfer to radiation oncology for planning and treatment  PATIENT DISPOSITION:  PACU - hemodynamically stable.   Delay start of Pharmacological VTE agent (>24hrs) due to surgical blood loss or risk of bleeding: not applicable

## 2024-02-17 NOTE — Anesthesia Preprocedure Evaluation (Signed)
 Anesthesia Evaluation  Patient identified by MRN, date of birth, ID band Patient awake    Reviewed: Allergy & Precautions, H&P , NPO status , Patient's Chart, lab work & pertinent test results  Airway Mallampati: II   Neck ROM: full    Dental   Pulmonary neg pulmonary ROS   breath sounds clear to auscultation       Cardiovascular negative cardio ROS  Rhythm:regular Rate:Normal     Neuro/Psych  PSYCHIATRIC DISORDERS Anxiety Depression       GI/Hepatic   Endo/Other    Renal/GU      Musculoskeletal   Abdominal   Peds  Hematology  (+) Blood dyscrasia, anemia Hemoglobin 10.4   Anesthesia Other Findings   Reproductive/Obstetrics                             Anesthesia Physical Anesthesia Plan  ASA: 2  Anesthesia Plan: General   Post-op Pain Management:    Induction: Intravenous  PONV Risk Score and Plan: 3 and Ondansetron, Dexamethasone, Midazolam and Treatment may vary due to age or medical condition  Airway Management Planned: LMA  Additional Equipment:   Intra-op Plan:   Post-operative Plan: Extubation in OR  Informed Consent: I have reviewed the patients History and Physical, chart, labs and discussed the procedure including the risks, benefits and alternatives for the proposed anesthesia with the patient or authorized representative who has indicated his/her understanding and acceptance.     Dental advisory given  Plan Discussed with: CRNA, Anesthesiologist and Surgeon  Anesthesia Plan Comments:        Anesthesia Quick Evaluation

## 2024-02-17 NOTE — Interval H&P Note (Signed)
 History and Physical Interval Note:  02/17/2024 7:17 AM  Kelly Thomas  has presented today for surgery, with the diagnosis of endocervical cancer.  The various methods of treatment have been discussed with the patient and family. After consideration of risks, benefits and other options for treatment, the patient has consented to  Procedure(s): INSERTION, UTERINE TANDEM AND RING OR CYLINDER, FOR BRACHYTHERAPY (N/A) [place] INTRAOPERATIVE (N/A) as a surgical intervention.  The patient's history has been reviewed, patient examined, no change in status, stable for surgery.  I have reviewed the patient's chart and labs.  Questions were answered to the patient's satisfaction.     Antony Blackbird

## 2024-02-17 NOTE — Anesthesia Procedure Notes (Signed)
 Procedure Name: LMA Insertion Date/Time: 02/17/2024 7:35 AM  Performed by: Garth Bigness, CRNAPre-anesthesia Checklist: Patient identified, Emergency Drugs available, Suction available and Patient being monitored Patient Re-evaluated:Patient Re-evaluated prior to induction Oxygen Delivery Method: Circle system utilized Preoxygenation: Pre-oxygenation with 100% oxygen Induction Type: IV induction Ventilation: Mask ventilation without difficulty LMA: LMA inserted LMA Size: 4.0 Tube type: Oral Number of attempts: 1 Placement Confirmation: positive ETCO2 and breath sounds checked- equal and bilateral Tube secured with: Tape Dental Injury: Teeth and Oropharynx as per pre-operative assessment

## 2024-02-17 NOTE — Transfer of Care (Signed)
 Immediate Anesthesia Transfer of Care Note  Patient: Kelly Thomas  Procedure(s) Performed: INSERTION, UTERINE TANDEM AND RING OR CYLINDER, FOR BRACHYTHERAPY [place] INTRAOPERATIVE  Patient Location: PACU  Anesthesia Type:General  Level of Consciousness: awake, alert , oriented, and patient cooperative  Airway & Oxygen Therapy: Patient Spontanous Breathing and Patient connected to face mask oxygen  Post-op Assessment: Report given to RN and Post -op Vital signs reviewed and stable  Post vital signs: Reviewed and stable  Last Vitals:  Vitals Value Taken Time  BP 121/66 02/17/24 0822  Temp 36.9 C 02/17/24 0822  Pulse 67 02/17/24 0825  Resp 11 02/17/24 0825  SpO2 100 % 02/17/24 0825  Vitals shown include unfiled device data.  Last Pain:  Vitals:   02/17/24 0822  TempSrc:   PainSc: 5          Complications: No notable events documented.

## 2024-02-18 ENCOUNTER — Encounter (HOSPITAL_COMMUNITY): Payer: Self-pay | Admitting: Radiation Oncology

## 2024-02-19 NOTE — Progress Notes (Signed)
 Patient phoned to give updated information on surgery.  Date of Surgery - 02-24-24  Arrival Time - 8:45 and check in at admitting.    NPO Status - patient reminded to not eat solid food or drink liquids after midnight.    Medications morning of surgery - If needed Oxycodone, Ondansetron or Compazine with a sip of water.    No change in medical history, allergies per patient since procedure on 02-17-24.  Transportation home - Mera Gunkel [phone]  All questions answered and patient stated understanding

## 2024-02-20 ENCOUNTER — Encounter (HOSPITAL_COMMUNITY)

## 2024-02-23 NOTE — Progress Notes (Signed)
  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 Identity was confirmed. All relevant records and images related to the planned course of therapy were reviewed. The patient freely provided informed written consent to proceed with treatment after reviewing the details related to the planned course of therapy. The consent form was witnessed and verified by the simulation staff. Then, the patient was set-up in a stable reproducible supine position for radiation therapy. The tandem ring system was accessed and fiducial markers were placed within the tandem and ring.   Simple treatment device note: On the operating room table the patient had construction of her custom tandem ring system. She will be treated with a 45 tandem/ring system. The patient had placement of a 60 mm tandem. A cervical ring with a large shielding was used for her treatment. A rectal paddle was also part of her custom set up device.  Verification simulation note: An AP and lateral film was obtained through the pelvis area. This was compared to the patient's planning films documenting accurate position of the tandem/ring system for treatment.  High-dose-rate brachytherapy treatment note:   The remote afterloading device was accessed through catheter system and attached to the tandem ring system. Patient then proceeded to undergo her fifth high-dose-rate treatment directed at the cervix. The patient was prescribed a dose of 5.5 Gy to the HRCTV.Marland Kitchen  Patient was treated with 2 channels using 26 dwell positions. Treatment time was 268.4 seconds. The patient tolerated the procedure well. After completion of her therapy, a radiation survey was performed documenting return of the iridium source into the GammaMed safe. The patient was then transferred to the nursing suite.  She then had removal of the rectal paddle followed by the tandem and ring system. The patient tolerated the removal well.  PLAN: The patient completed her fifth and last treatment. Will return next month for her follow up. ________________________________   Billie Lade, PhD, MD  This document serves as a record of services personally performed by Antony Blackbird, MD. It was created on his behalf by Herbie Saxon, a trained medical scribe. The creation of this record is based on the scribe's personal observations and the provider's statements to them. This document has been checked and approved by the attending provider.

## 2024-02-24 ENCOUNTER — Other Ambulatory Visit: Payer: Self-pay

## 2024-02-24 ENCOUNTER — Ambulatory Visit
Admission: RE | Admit: 2024-02-24 | Discharge: 2024-02-24 | Disposition: A | Discharge status: Home / Self Care | Source: Ambulatory Visit | Attending: Radiation Oncology | Admitting: Radiation Oncology

## 2024-02-24 ENCOUNTER — Ambulatory Visit (HOSPITAL_BASED_OUTPATIENT_CLINIC_OR_DEPARTMENT_OTHER): Admitting: Anesthesiology

## 2024-02-24 ENCOUNTER — Ambulatory Visit (HOSPITAL_COMMUNITY)
Admission: RE | Admit: 2024-02-24 | Discharge: 2024-02-24 | Disposition: A | Discharge status: Home / Self Care | Attending: Radiation Oncology | Admitting: Radiation Oncology

## 2024-02-24 ENCOUNTER — Encounter (HOSPITAL_COMMUNITY): Payer: Self-pay | Admitting: Radiation Oncology

## 2024-02-24 ENCOUNTER — Ambulatory Visit (HOSPITAL_COMMUNITY)
Admission: RE | Admit: 2024-02-24 | Discharge: 2024-02-24 | Disposition: A | Discharge status: Home / Self Care | Source: Ambulatory Visit | Attending: Radiation Oncology | Admitting: Radiation Oncology

## 2024-02-24 ENCOUNTER — Encounter (HOSPITAL_COMMUNITY)
Admission: RE | Disposition: A | Discharge status: Home / Self Care | Payer: Self-pay | Source: Home / Self Care | Attending: Radiation Oncology

## 2024-02-24 ENCOUNTER — Ambulatory Visit (HOSPITAL_COMMUNITY): Admitting: Anesthesiology

## 2024-02-24 VITALS — BP 119/66 | HR 69 | Temp 97.6°F | Resp 14

## 2024-02-24 DIAGNOSIS — F418 Other specified anxiety disorders: Secondary | ICD-10-CM

## 2024-02-24 DIAGNOSIS — C530 Malignant neoplasm of endocervix: Secondary | ICD-10-CM

## 2024-02-24 DIAGNOSIS — E785 Hyperlipidemia, unspecified: Secondary | ICD-10-CM | POA: Diagnosis not present

## 2024-02-24 LAB — RAD ONC ARIA SESSION SUMMARY
Course Elapsed Days: 75
Plan Fractions Treated to Date: 1
Plan Prescribed Dose Per Fraction: 5.5 Gy
Plan Total Fractions Prescribed: 1
Plan Total Prescribed Dose: 5.5 Gy
Reference Point Dosage Given to Date: 19.1375 Gy
Reference Point Dosage Given to Date: 19.4004 Gy
Reference Point Dosage Given to Date: 27.5 Gy
Reference Point Dosage Given to Date: 40.2488 Gy
Reference Point Dosage Given to Date: 40.4343 Gy
Reference Point Session Dosage Given: 3.8594 Gy
Reference Point Session Dosage Given: 3.8946 Gy
Reference Point Session Dosage Given: 5.5 Gy
Reference Point Session Dosage Given: 7.5911 Gy
Reference Point Session Dosage Given: 7.842 Gy
Session Number: 35

## 2024-02-24 LAB — CBC WITH DIFFERENTIAL/PLATELET
Abs Immature Granulocytes: 0.04 10*3/uL (ref 0.00–0.07)
Basophils Absolute: 0 10*3/uL (ref 0.0–0.1)
Basophils Relative: 1 %
Eosinophils Absolute: 0 10*3/uL (ref 0.0–0.5)
Eosinophils Relative: 1 %
HCT: 34.2 % — ABNORMAL LOW (ref 36.0–46.0)
Hemoglobin: 11.4 g/dL — ABNORMAL LOW (ref 12.0–15.0)
Immature Granulocytes: 2 %
Lymphocytes Relative: 15 %
Lymphs Abs: 0.3 10*3/uL — ABNORMAL LOW (ref 0.7–4.0)
MCH: 34 pg (ref 26.0–34.0)
MCHC: 33.3 g/dL (ref 30.0–36.0)
MCV: 102.1 fL — ABNORMAL HIGH (ref 80.0–100.0)
Monocytes Absolute: 0.3 10*3/uL (ref 0.1–1.0)
Monocytes Relative: 15 %
Neutro Abs: 1.4 10*3/uL — ABNORMAL LOW (ref 1.7–7.7)
Neutrophils Relative %: 66 %
Platelets: 186 10*3/uL (ref 150–400)
RBC: 3.35 MIL/uL — ABNORMAL LOW (ref 3.87–5.11)
RDW: 17.7 % — ABNORMAL HIGH (ref 11.5–15.5)
WBC: 2.2 10*3/uL — ABNORMAL LOW (ref 4.0–10.5)
nRBC: 0 % (ref 0.0–0.2)

## 2024-02-24 SURGERY — INSERTION, UTERINE TANDEM AND RING OR CYLINDER, FOR BRACHYTHERAPY
Anesthesia: General

## 2024-02-24 MED ORDER — ONDANSETRON HCL 4 MG/2ML IJ SOLN
INTRAMUSCULAR | Status: AC
  Filled 2024-02-24: qty 2

## 2024-02-24 MED ORDER — EPHEDRINE 5 MG/ML INJ
INTRAVENOUS | Status: AC
  Filled 2024-02-24: qty 5

## 2024-02-24 MED ORDER — OXYCODONE HCL 5 MG/5ML PO SOLN: 5.0000 mg | Freq: Once | ORAL | Status: DC | PRN

## 2024-02-24 MED ORDER — MEPERIDINE HCL 50 MG/ML IJ SOLN: 6.2500 mg | INTRAMUSCULAR | Status: DC | PRN

## 2024-02-24 MED ORDER — OXYCODONE HCL 5 MG PO TABS: 5.0000 mg | ORAL_TABLET | Freq: Once | ORAL | Status: DC | PRN

## 2024-02-24 MED ORDER — ONDANSETRON HCL 4 MG/2ML IJ SOLN
INTRAMUSCULAR | Status: DC | PRN
  Administered 2024-02-24: 4 mg via INTRAVENOUS

## 2024-02-24 MED ORDER — EPHEDRINE SULFATE-NACL 50-0.9 MG/10ML-% IV SOSY
PREFILLED_SYRINGE | INTRAVENOUS | Status: DC | PRN
  Administered 2024-02-24 (×5): 5 mg via INTRAVENOUS

## 2024-02-24 MED ORDER — PROPOFOL 10 MG/ML IV BOLUS
INTRAVENOUS | Status: AC
  Filled 2024-02-24: qty 20

## 2024-02-24 MED ORDER — FENTANYL CITRATE (PF) 100 MCG/2ML IJ SOLN
INTRAMUSCULAR | Status: AC
  Filled 2024-02-24: qty 2

## 2024-02-24 MED ORDER — DEXAMETHASONE SODIUM PHOSPHATE 10 MG/ML IJ SOLN
INTRAMUSCULAR | Status: DC | PRN
  Administered 2024-02-24: 4 mg via INTRAVENOUS

## 2024-02-24 MED ORDER — PROPOFOL 10 MG/ML IV BOLUS
INTRAVENOUS | Status: DC | PRN
  Administered 2024-02-24: 90 mg via INTRAVENOUS
  Administered 2024-02-24: 110 mg via INTRAVENOUS

## 2024-02-24 MED ORDER — DEXAMETHASONE SODIUM PHOSPHATE 10 MG/ML IJ SOLN
INTRAMUSCULAR | Status: AC
  Filled 2024-02-24: qty 1

## 2024-02-24 MED ORDER — FENTANYL CITRATE (PF) 100 MCG/2ML IJ SOLN
INTRAMUSCULAR | Status: DC | PRN
  Administered 2024-02-24: 50 ug via INTRAVENOUS

## 2024-02-24 MED ORDER — CELECOXIB 200 MG PO CAPS
200.0000 mg | ORAL_CAPSULE | Freq: Once | ORAL | Status: AC
  Filled 2024-02-24: qty 1

## 2024-02-24 MED ORDER — STERILE WATER FOR IRRIGATION IR SOLN
Status: DC | PRN
  Administered 2024-02-24: 1000 mL

## 2024-02-24 MED ORDER — LIDOCAINE HCL (PF) 2 % IJ SOLN
INTRAMUSCULAR | Status: AC
  Filled 2024-02-24: qty 5

## 2024-02-24 MED ORDER — LIDOCAINE HCL (PF) 2 % IJ SOLN
INTRAMUSCULAR | Status: DC | PRN
  Administered 2024-02-24: 70 mg via INTRADERMAL

## 2024-02-24 MED ORDER — LACTATED RINGERS IV SOLN
INTRAVENOUS | Status: DC
Start: 2024-02-24 — End: 2024-02-25
  Filled 2024-02-24: qty 1000

## 2024-02-24 MED ORDER — HYDROMORPHONE HCL 1 MG/ML IJ SOLN
INTRAMUSCULAR | Status: AC
  Filled 2024-02-24: qty 1

## 2024-02-24 MED ORDER — ACETAMINOPHEN 500 MG PO TABS
1000.0000 mg | ORAL_TABLET | Freq: Once | ORAL | Status: AC
  Administered 2024-02-24: 1000 mg via ORAL
  Filled 2024-02-24: qty 2

## 2024-02-24 MED ORDER — CHLORHEXIDINE GLUCONATE 0.12 % MT SOLN
15.0000 mL | Freq: Once | OROMUCOSAL | Status: AC
  Administered 2024-02-24: 15 mL via OROMUCOSAL

## 2024-02-24 MED ORDER — MIDAZOLAM HCL 2 MG/2ML IJ SOLN
INTRAMUSCULAR | Status: AC
  Filled 2024-02-24: qty 2

## 2024-02-24 MED ORDER — HYDROMORPHONE HCL 1 MG/ML IJ SOLN
0.5000 mg | Freq: Once | INTRAMUSCULAR | Status: AC
  Administered 2024-02-24: 0.5 mg via INTRAVENOUS

## 2024-02-24 MED ORDER — PHENYLEPHRINE 80 MCG/ML (10ML) SYRINGE FOR IV PUSH (FOR BLOOD PRESSURE SUPPORT)
PREFILLED_SYRINGE | INTRAVENOUS | Status: DC | PRN
  Administered 2024-02-24 (×2): 80 ug via INTRAVENOUS

## 2024-02-24 MED ORDER — CELECOXIB 200 MG PO CAPS
200.0000 mg | ORAL_CAPSULE | Freq: Once | ORAL | Status: AC
  Administered 2024-02-24: 200 mg via ORAL

## 2024-02-24 MED ORDER — ONDANSETRON HCL 4 MG/2ML IJ SOLN: 4.0000 mg | Freq: Once | INTRAMUSCULAR | Status: DC | PRN

## 2024-02-24 MED ORDER — FENTANYL CITRATE PF 50 MCG/ML IJ SOSY: 25.0000 ug | PREFILLED_SYRINGE | INTRAMUSCULAR | Status: DC | PRN

## 2024-02-24 MED ORDER — ESTRADIOL 0.1 MG/GM VA CREA
TOPICAL_CREAM | VAGINAL | Status: AC
  Filled 2024-02-24: qty 42.5

## 2024-02-24 MED ORDER — ORAL CARE MOUTH RINSE: 15.0000 mL | Freq: Once | OROMUCOSAL | Status: AC

## 2024-02-24 MED ORDER — ACETAMINOPHEN 500 MG PO TABS: 1000.0000 mg | ORAL_TABLET | Freq: Once | ORAL | Status: DC

## 2024-02-24 MED ORDER — LACTATED RINGERS IV SOLN: INTRAVENOUS | Status: DC

## 2024-02-24 MED ORDER — MIDAZOLAM HCL 2 MG/2ML IJ SOLN
INTRAMUSCULAR | Status: DC | PRN
  Administered 2024-02-24 (×2): 1 mg via INTRAVENOUS

## 2024-02-24 SURGICAL SUPPLY — 29 items
BAG COUNTER SPONGE SURGICOUNT (BAG) IMPLANT
BAG URINE DRAIN 2000ML AR STRL (UROLOGICAL SUPPLIES) ×1 IMPLANT
CATH FOLEY 2WAY SLVR 5CC 16FR (CATHETERS) IMPLANT
CATH FOLEY 3WAY 5CC 16FR (CATHETERS) IMPLANT
COVER BACK TABLE 60X90IN (DRAPES) ×1 IMPLANT
COVER SURGICAL LIGHT HANDLE (MISCELLANEOUS) ×1 IMPLANT
DILATOR CANAL MILEX (MISCELLANEOUS) IMPLANT
DRAPE SHEET LG 3/4 BI-LAMINATE (DRAPES) ×1 IMPLANT
DRAPE UNDERBUTTOCKS STRL (DISPOSABLE) ×1 IMPLANT
GAUZE 4X4 16PLY ~~LOC~~+RFID DBL (SPONGE) ×1 IMPLANT
GAUZE PAD ABD 8X10 STRL (GAUZE/BANDAGES/DRESSINGS) ×2 IMPLANT
GLOVE BIO SURGEON STRL SZ7.5 (GLOVE) ×2 IMPLANT
GOWN STRL REUS W/ TWL LRG LVL3 (GOWN DISPOSABLE) ×1 IMPLANT
KIT BASIN OR (CUSTOM PROCEDURE TRAY) ×1 IMPLANT
KIT TURNOVER KIT A (KITS) ×1 IMPLANT
LEGGING LITHOTOMY PAIR STRL (DRAPES) ×1 IMPLANT
MAT PREVALON FULL STRYKER (MISCELLANEOUS) ×1 IMPLANT
PACKING VAGINAL (PACKING) ×1 IMPLANT
PENCIL SMOKE EVACUATOR (MISCELLANEOUS) IMPLANT
PLUG CATH AND CAP STRL 200 (CATHETERS) IMPLANT
SET IRRIG Y TYPE TUR BLADDER L (SET/KITS/TRAYS/PACK) IMPLANT
SURGILUBE 2OZ TUBE FLIPTOP (MISCELLANEOUS) ×1 IMPLANT
SUT PROLENE 2 0 CT2 30 (SUTURE) IMPLANT
SUT VIC AB 0 CT1 27XBRD ANTBC (SUTURE) IMPLANT
SYR 10ML LL (SYRINGE) ×1 IMPLANT
TOWEL OR 17X26 10 PK STRL BLUE (TOWEL DISPOSABLE) ×1 IMPLANT
UNDERPAD 30X36 HEAVY ABSORB (UNDERPADS AND DIAPERS) ×2 IMPLANT
WATER STERILE IRR 500ML POUR (IV SOLUTION) ×1 IMPLANT
YANKAUER SUCT BULB TIP 10FT TU (MISCELLANEOUS) ×1 IMPLANT

## 2024-02-24 NOTE — Anesthesia Procedure Notes (Signed)
 Procedure Name: LMA Insertion Date/Time: 02/24/2024 11:17 AM  Performed by: Sindy Guadeloupe, CRNAPre-anesthesia Checklist: Patient identified, Emergency Drugs available, Suction available, Patient being monitored and Timeout performed Patient Re-evaluated:Patient Re-evaluated prior to induction Oxygen Delivery Method: Circle system utilized Preoxygenation: Pre-oxygenation with 100% oxygen Induction Type: IV induction Ventilation: Mask ventilation without difficulty LMA: LMA inserted LMA Size: 4.0 Number of attempts: 1 Placement Confirmation: positive ETCO2 Tube secured with: Tape Dental Injury: Teeth and Oropharynx as per pre-operative assessment

## 2024-02-24 NOTE — Transfer of Care (Signed)
 Immediate Anesthesia Transfer of Care Note  Patient: Kelly Thomas  Procedure(s) Performed: INSERTION, UTERINE TANDEM AND RING OR CYLINDER, FOR BRACHYTHERAPY [place] INTRAOPERATIVE  Patient Location: PACU  Anesthesia Type:General  Level of Consciousness: drowsy and responds to stimulation  Airway & Oxygen Therapy: Patient Spontanous Breathing and Patient connected to face mask oxygen  Post-op Assessment: Report given to RN and Post -op Vital signs reviewed and stable  Post vital signs: Reviewed and stable  Last Vitals:  Vitals Value Taken Time  BP 104/53 02/24/24 1152  Temp    Pulse 70 02/24/24 1154  Resp 13 02/24/24 1154  SpO2 100 % 02/24/24 1154  Vitals shown include unfiled device data.  Last Pain:  Vitals:   02/24/24 0948  TempSrc:   PainSc: 0-No pain      Patients Stated Pain Goal: 5 (02/24/24 0936)  Complications: No notable events documented.

## 2024-02-24 NOTE — Patient Instructions (Signed)
 IMMEDIATELY FOLLOWING SURGERY: Do not drive or operate machinery for the first twenty four hours after surgery. Do not make any important decisions for twenty four hours after surgery or while taking narcotic pain medications or sedatives. If you develop intractable nausea and vomiting or a severe headache please notify your doctor immediately.   FOLLOW-UP: You do not need to follow up with anesthesia unless specifically instructed to do so.   WOUND CARE INSTRUCTIONS (if applicable): Expect some mild vaginal bleeding, but if large amount of bleeding occurs please contact Dr. Roselind Messier at [phone] or the Radiation On-Call physician. Call for any fever greater than 101.0 degrees or increasing vaginal//abdominal pain or trouble urinating.   QUESTIONS?: Please feel free to call your physician or the hospital operator if you have any questions, and they will be happy to assist you. Resume all medications: as listed on your after visit summary. Your next appointment is:  Future Appointments  Date Time Provider Department Center  03/18/2024  9:00 AM [facility] FLUSH [facility] None  03/18/2024  9:40 AM Artis Delay, MD [facility] None  03/25/2024 11:00 AM Antony Blackbird, MD [facility] None

## 2024-02-24 NOTE — Anesthesia Postprocedure Evaluation (Signed)
 Anesthesia Post Note  Patient: Kelly Thomas  Procedure(s) Performed: INSERTION, UTERINE TANDEM AND RING OR CYLINDER, FOR BRACHYTHERAPY [place] INTRAOPERATIVE     Patient location during evaluation: PACU Anesthesia Type: General Level of consciousness: awake and alert Pain management: pain level controlled Vital Signs Assessment: post-procedure vital signs reviewed and stable Respiratory status: spontaneous breathing, nonlabored ventilation, respiratory function stable and patient connected to nasal cannula oxygen Cardiovascular status: blood pressure returned to baseline and stable Postop Assessment: no apparent nausea or vomiting Anesthetic complications: no   No notable events documented.  Last Vitals:  Vitals:   02/24/24 0836 02/24/24 1152  BP: 126/71 (!) 104/53  Pulse: 76 71  Resp: 16 13  Temp: 36.8 C 36.4 C  SpO2: 99% 100%    Last Pain:  Vitals:   02/24/24 1152  TempSrc:   PainSc: Asleep                 Selby Foisy

## 2024-02-24 NOTE — Anesthesia Preprocedure Evaluation (Addendum)
 Anesthesia Evaluation  Patient identified by MRN, date of birth, ID band Patient awake    Reviewed: Allergy & Precautions, H&P , NPO status , Patient's Chart, lab work & pertinent test results  Airway Mallampati: II   Neck ROM: full    Dental  (+) Dental Advisory Given, Teeth Intact   Pulmonary neg pulmonary ROS   breath sounds clear to auscultation       Cardiovascular negative cardio ROS  Rhythm:regular Rate:Normal     Neuro/Psych  PSYCHIATRIC DISORDERS Anxiety Depression       GI/Hepatic   Endo/Other    Renal/GU      Musculoskeletal   Abdominal   Peds  Hematology  (+) Blood dyscrasia, anemia Hemoglobin 10.4   Anesthesia Other Findings   Reproductive/Obstetrics                             Anesthesia Physical Anesthesia Plan  ASA: 3  Anesthesia Plan: General   Post-op Pain Management: Minimal or no pain anticipated, Tylenol PO (pre-op)* and Celebrex PO (pre-op)*   Induction: Intravenous  PONV Risk Score and Plan: 3 and Ondansetron, Dexamethasone, Midazolam and Treatment may vary due to age or medical condition  Airway Management Planned: LMA and Oral ETT  Additional Equipment: None  Intra-op Plan:   Post-operative Plan: Extubation in OR  Informed Consent: I have reviewed the patients History and Physical, chart, labs and discussed the procedure including the risks, benefits and alternatives for the proposed anesthesia with the patient or authorized representative who has indicated his/her understanding and acceptance.     Dental advisory given  Plan Discussed with: CRNA, Anesthesiologist and Surgeon  Anesthesia Plan Comments:        Anesthesia Quick Evaluation

## 2024-02-24 NOTE — Op Note (Signed)
 02/24/2024  12:02 PM  PATIENT:  Kelly Thomas  68 y.o. female  PRE-OPERATIVE DIAGNOSIS:  MALIGNANT NEOPOLASM OF ENDOCERVIX  POST-OPERATIVE DIAGNOSIS:  MALIGNANT NEOPOLASM OF ENDOCERVIX  PROCEDURE:  Procedure(s): INSERTION, UTERINE TANDEM AND RING OR CYLINDER, FOR BRACHYTHERAPY (N/A) [place] INTRAOPERATIVE (N/A)  SURGEON:  Surgeons and Role:    * Antony Blackbird, MD - Primary  PHYSICIAN ASSISTANT:   ASSISTANTS: none   ANESTHESIA:   general  EBL:  0 mL   BLOOD ADMINISTERED:none  DRAINS: Urinary Catheter (Foley)   LOCAL MEDICATIONS USED:  NONE  SPECIMEN:  No Specimen  DISPOSITION OF SPECIMEN:  N/A  COUNTS:  YES  TOURNIQUET:  * No tourniquets in log *  DICTATION: The patient was prepped and draped in the usual sterile fashion and placed in the dorsolithotomy position.  Timeout for the procedure, preoperative medications and estimated length of the procedure was performed.  A Foley catheter was placed and backfilled with approximately 200 cc of saline for ultrasound imaging purposes.  She then proceeded to undergo exam under anesthesia.  The cervical mass responded very well to external beam radiation treatment.  The estimated size of the cervix was approximately 2-1/2 x 2.5 cm.  The right lateral fornice was somewhat limited but no obvious parametrial involvement.  The patient then underwent sounding and dilation of the cervical os.  The estimated length of the uterus was approximately 6.9 cm.  She was noted to have a large fibroid in the left posterior location of the uterus.  On transabdominal ultrasound this fibroid measured approximately 2.5 cm in greatest dimension.  After dilation of the cervix, a 60 mm cervical sleeve was placed.  A 60 mm 45 degree tandem was placed with inside the cervical sleeve.  Excellent position was noted on the transabdominal ultrasound images.  Patient then had placement of a 45 degree cervical ring with a large shielding cap in place.  This was followed by  a rectal paddle posteriorly.  There was no room for additional vaginal packing.  She tolerated the procedure well and was subsequently transported to the recovery room in stable condition.  Later in the day the patient will proceed with her fifth high-dose-rate treatment.  Plan is for the patient to receive 5.5 Gy to the high risk clinical target volume within the cervical region     PLAN OF CARE:  Transfer to radiation oncology for planning and treatment  PATIENT DISPOSITION:  PACU - hemodynamically stable.   Delay start of Pharmacological VTE agent (>24hrs) due to surgical blood loss or risk of bleeding: not applicable

## 2024-02-24 NOTE — Interval H&P Note (Signed)
 History and Physical Interval Note:  02/24/2024 10:48 AM  Kelly Thomas  has presented today for surgery, with the diagnosis of MALIGNANT NEOPOLASM OF ENDOCERVIX.  The various methods of treatment have been discussed with the patient and family. After consideration of risks, benefits and other options for treatment, the patient has consented to  Procedure(s): INSERTION, UTERINE TANDEM AND RING OR CYLINDER, FOR BRACHYTHERAPY (N/A) [place] INTRAOPERATIVE (N/A) as a surgical intervention.  The patient's history has been reviewed, patient examined, no change in status, stable for surgery.  I have reviewed the patient's chart and labs.  Questions were answered to the patient's satisfaction.     Antony Blackbird

## 2024-02-25 ENCOUNTER — Encounter (HOSPITAL_COMMUNITY): Payer: Self-pay | Admitting: Radiation Oncology

## 2024-02-25 NOTE — Radiation Completion Notes (Addendum)
  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  Intent: Curative     ==========DELIVERED PLANS==========  First Treatment Date: 2023-12-11 Last Treatment Date: 2024-02-24   Plan Name: Pelvis Site: Pelvis Technique: 3D Mode: Photon Dose Per Fraction: 1.8 Gy Prescribed Dose (Delivered / Prescribed): 9 Gy / 9 Gy Prescribed Fxs (Delivered / Prescribed): 5 / 5   Plan Name: Pelvis_Bst Site: Pelvis Technique: IMRT Mode: Photon Dose Per Fraction: 1.8 Gy Prescribed Dose (Delivered / Prescribed): 36 Gy / 36 Gy Prescribed Fxs (Delivered / Prescribed): 20 / 20   Plan Name: Cervix_HDR_F1 Site: Cervix Technique: HDR Ir-192 Mode: Brachytherapy Dose Per Fraction: 5.5 Gy Prescribed Dose (Delivered / Prescribed): 5.5 Gy / 5.5 Gy Prescribed Fxs (Delivered / Prescribed): 1 / 1   Plan Name: Cervix_HDR_F2 Site: Cervix Technique: HDR Ir-192 Mode: Brachytherapy Dose Per Fraction: 5.5 Gy Prescribed Dose (Delivered / Prescribed): 5.5 Gy / 5.5 Gy Prescribed Fxs (Delivered / Prescribed): 1 / 1   Plan Name: Cervix_HDR_F3 Site: Cervix Technique: HDR Ir-192 Mode: Brachytherapy Dose Per Fraction: 5.5 Gy Prescribed Dose (Delivered / Prescribed): 5.5 Gy / 5.5 Gy Prescribed Fxs (Delivered / Prescribed): 1 / 1   Plan Name: Cervix_HDR_F4 Site: Cervix Technique: HDR Ir-192 Mode: Brachytherapy Dose Per Fraction: 5.5 Gy Prescribed Dose (Delivered / Prescribed): 5.5 Gy / 5.5 Gy Prescribed Fxs (Delivered / Prescribed): 1 / 1   Plan Name: Cervix_HDR_F5 Site: Cervix Technique: HDR Ir-192 Mode: Brachytherapy Dose Per Fraction: 5.5 Gy Prescribed Dose (Delivered / Prescribed): 5.5 Gy / 5.5 Gy Prescribed Fxs (Delivered / Prescribed): 1 / 1   Plan Name:  Pelvis_Bst2 Site: Pelvis Technique: 3D Mode: Photon Dose Per Fraction: 1.8 Gy Prescribed Dose (Delivered / Prescribed): 9 Gy / 9 Gy Prescribed Fxs (Delivered / Prescribed): 5 / 5     ====================================  The patient tolerated radiation relatively well. She completed radiation directed at the pelvis and boost treatment on 01/21/2024. During her final weekly treatment check of Pelvic RT on 01/20/2024 the patient was experiencing dysuria, diarrhea, fatigue, and pelvic pain. She denies any pain, skin irritation, vaginal or rectal bleeding or issues with her bladder.  The patient then proceeded with 5 fractions HDR brachytherapy directed at the cervix on 01/26/2024 through 02/24/2024 which she tolerated well overall.   The patient will return in one month and will continue follow up with Drs. Orvil Bland and [place] as well.      Amiel Kalata, PA-C

## 2024-03-05 NOTE — Addendum Note (Signed)
 Encounter addended by: Pearlene Bouchard, PA-C on: 03/05/2024 12:35 PM  Actions taken: Clinical Note Signed

## 2024-03-18 ENCOUNTER — Inpatient Hospital Stay: Attending: Hematology and Oncology

## 2024-03-18 ENCOUNTER — Encounter: Payer: Self-pay | Admitting: Hematology and Oncology

## 2024-03-18 ENCOUNTER — Inpatient Hospital Stay: Admitting: Hematology and Oncology

## 2024-03-18 VITALS — BP 127/68 | HR 74 | Temp 97.4°F | Resp 18 | Ht 60.0 in | Wt 119.1 lb

## 2024-03-18 DIAGNOSIS — D61818 Other pancytopenia: Secondary | ICD-10-CM | POA: Insufficient documentation

## 2024-03-18 DIAGNOSIS — Z923 Personal history of irradiation: Secondary | ICD-10-CM | POA: Insufficient documentation

## 2024-03-18 DIAGNOSIS — R634 Abnormal weight loss: Secondary | ICD-10-CM | POA: Diagnosis not present

## 2024-03-18 DIAGNOSIS — C530 Malignant neoplasm of endocervix: Secondary | ICD-10-CM | POA: Insufficient documentation

## 2024-03-18 DIAGNOSIS — Z9221 Personal history of antineoplastic chemotherapy: Secondary | ICD-10-CM | POA: Insufficient documentation

## 2024-03-18 LAB — CBC WITH DIFFERENTIAL/PLATELET
Abs Immature Granulocytes: 0.01 10*3/uL (ref 0.00–0.07)
Basophils Absolute: 0 10*3/uL (ref 0.0–0.1)
Basophils Relative: 2 %
Eosinophils Absolute: 0.1 10*3/uL (ref 0.0–0.5)
Eosinophils Relative: 5 %
HCT: 35.9 % — ABNORMAL LOW (ref 36.0–46.0)
Hemoglobin: 12.1 g/dL (ref 12.0–15.0)
Immature Granulocytes: 0 %
Lymphocytes Relative: 16 %
Lymphs Abs: 0.4 10*3/uL — ABNORMAL LOW (ref 0.7–4.0)
MCH: 33.3 pg (ref 26.0–34.0)
MCHC: 33.7 g/dL (ref 30.0–36.0)
MCV: 98.9 fL (ref 80.0–100.0)
Monocytes Absolute: 0.3 10*3/uL (ref 0.1–1.0)
Monocytes Relative: 13 %
Neutro Abs: 1.7 10*3/uL (ref 1.7–7.7)
Neutrophils Relative %: 64 %
Platelets: 170 10*3/uL (ref 150–400)
RBC: 3.63 MIL/uL — ABNORMAL LOW (ref 3.87–5.11)
RDW: 14 % (ref 11.5–15.5)
WBC: 2.6 10*3/uL — ABNORMAL LOW (ref 4.0–10.5)
nRBC: 0 % (ref 0.0–0.2)

## 2024-03-18 LAB — COMPREHENSIVE METABOLIC PANEL WITH GFR
ALT: 28 U/L (ref 0–44)
AST: 26 U/L (ref 15–41)
Albumin: 4.3 g/dL (ref 3.5–5.0)
Alkaline Phosphatase: 60 U/L (ref 38–126)
Anion gap: 6 (ref 5–15)
BUN: 15 mg/dL (ref 8–23)
CO2: 28 mmol/L (ref 22–32)
Calcium: 9.2 mg/dL (ref 8.9–10.3)
Chloride: 107 mmol/L (ref 98–111)
Creatinine, Ser: 0.84 mg/dL (ref 0.44–1.00)
GFR, Estimated: >60 mL/min (ref >60)
Glucose, Bld: 94 mg/dL (ref 70–99)
Potassium: 4 mmol/L (ref 3.5–5.1)
Sodium: 141 mmol/L (ref 135–145)
Total Bilirubin: 0.6 mg/dL (ref 0.0–1.2)
Total Protein: 6.5 g/dL (ref 6.5–8.1)

## 2024-03-18 LAB — MAGNESIUM: Magnesium: 2.1 mg/dL (ref 1.7–2.4)

## 2024-03-18 MED ORDER — SODIUM CHLORIDE 0.9% FLUSH
10.0000 mL | Freq: Once | INTRAVENOUS | Status: AC
Start: 2024-03-18 — End: 2024-03-18
  Administered 2024-03-18: 10 mL

## 2024-03-18 MED ORDER — HEPARIN SOD (PORK) LOCK FLUSH 100 UNIT/ML IV SOLN
250.0000 [IU] | Freq: Once | INTRAVENOUS | Status: AC
Start: 2024-03-18 — End: 2024-03-18
  Administered 2024-03-18: 250 [IU]

## 2024-03-18 NOTE — Assessment & Plan Note (Addendum)
 She has stage II cervical cancer, completed concurrent chemoradiation therapy with weekly cisplatin  Cervical mass biopsy: Poorly differentiated invasive squamous cell carcinoma, PD-L1 95%   She is recovering well from side effects of radiation and is gaining weight She has mild persistent leukopenia but not symptomatic Continue supportive care Plan to repeat imaging study in July and with repeat blood work If her scan is normal, we will get her port removed

## 2024-03-18 NOTE — Progress Notes (Addendum)
 [facility] OFFICE PROGRESS NOTE  Patient Care Team: Omie Bickers, MD as PCP - General (Internal Medicine)  Assessment & Plan Malignant neoplasm of endocervix [facility]) She has stage II cervical cancer, completed concurrent chemoradiation therapy with weekly cisplatin  Cervical mass biopsy: Poorly differentiated invasive squamous cell carcinoma, PD-L1 95%   She is recovering well from side effects of radiation and is gaining weight She has mild persistent leukopenia but not symptomatic Continue supportive care Plan to repeat imaging study in July and with repeat blood work If her scan is normal, we will get her port removed Pancytopenia, acquired (HCC) This is gradually improving She is still leukopenic but not symptomatic Observe only Weight loss, non-intentional She is gaining weight since completion of treatment We discussed importance of frequent small meals  Orders Placed This Encounter  Procedures   NM PET Image Restage (PS) Skull Base to Thigh (F-18 FDG)    Standing Status:   Future    Expected Date:   05/25/2024    Expiration Date:   03/18/2025    If indicated for the ordered procedure, I authorize the administration of a radiopharmaceutical per Radiology protocol:   Yes    Preferred imaging location?:   Dewey Fordyce, MD  INTERVAL HISTORY: she returns for surveillance follow-up She has completed radiation therapy since April 8 She has lost a lot of weight down to 110 pounds but is gaining weight now No recent infection No residual side effects from chemotherapy  PHYSICAL EXAMINATION: ECOG PERFORMANCE STATUS: 1 - Symptomatic but completely ambulatory  Vitals:   03/18/24 0923  BP: 127/68  Pulse: 74  Resp: 18  Temp: (!) 97.4 F (36.3 C)  SpO2: 100%   Filed Weights   03/18/24 0923  Weight: 119 lb 1.6 oz (54 kg)    Relevant data reviewed during this visit included CBC, CMP and magnesium 

## 2024-03-18 NOTE — Addendum Note (Signed)
 Addended byMarton Sleeper, Sharlie Shreffler on: 03/18/2024 11:11 AM   Modules accepted: Orders

## 2024-03-18 NOTE — Assessment & Plan Note (Addendum)
 This is gradually improving She is still leukopenic but not symptomatic Observe only

## 2024-03-18 NOTE — Assessment & Plan Note (Addendum)
 She is gaining weight since completion of treatment We discussed importance of frequent small meals

## 2024-03-24 ENCOUNTER — Encounter: Payer: Self-pay | Admitting: Radiation Oncology

## 2024-03-24 NOTE — Progress Notes (Signed)
 Radiation Oncology         [phone] ________________________________  Name: Kelly Thomas MRN: [id number]  Date: 03/25/2024  DOB: Jul 02, 1956  Follow-Up Visit Note  CC: Omie Bickers, MD  Suzi Essex, MD  No diagnosis found.  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   Interval Since Last Radiation:  1 month Intent: Curative  First Treatment Date: 2023-12-11 Last Treatment Date: 2024-02-24   Plan Name: Pelvis Site: Pelvis Technique: 3D Mode: Photon Dose Per Fraction: 1.8 Gy Prescribed Dose (Delivered / Prescribed): 9 Gy / 9 Gy Prescribed Fxs (Delivered / Prescribed): 5 / 5   Plan Name: Pelvis_Bst Site: Pelvis Technique: IMRT Mode: Photon Dose Per Fraction: 1.8 Gy Prescribed Dose (Delivered / Prescribed): 36 Gy / 36 Gy Prescribed Fxs (Delivered / Prescribed): 20 / 20   Plan Name: Cervix_HDR_F1-5 Site: Cervix Technique: HDR Ir-192 Mode: Brachytherapy Dose Per Fraction: 5.5 Gy Prescribed Dose (Delivered / Prescribed): 27.5 Gy / 27.5.5 Gy Prescribed Fxs (Delivered / Prescribed): 5 / 5  Plan Name: Pelvis_Bst2 Site: Pelvis Technique: 3D Mode: Photon Dose Per Fraction: 1.8 Gy Prescribed Dose (Delivered / Prescribed): 9 Gy / 9 Gy Prescribed Fxs (Delivered / Prescribed): 5 / 5  Narrative:  The patient returns today for routine follow-up. She was last seen in office on 12/10/23 for a follow up visit. Since then, patient completed her radiation treatment which she tolerated quite well. Patient did however endorse experiencing dysuria, diarrhea, fatigue, and pelvic pain.   In the interval since she was last seen, she presented for a follow up with Dr. Marton Sleeper on 01/01/24 during which her previous rash which was suspected to be resulted from Emend has resolved. At that time, patient tolerated her first round of chemo, Cisplatin , quite well. She returned for a follow up on 2/20 during which she complained of  nausea due to treatment and was prescribed antiemetics for.   During a follow up on 01/22/24, Dr. Marton Sleeper decided to discontinue chemo after 5 doses of treatment due to worsening pancytopenia and weight loss. During most recent follow up on 5/1, patient reported recovering from treatment well and is regaining her weight back.   No other significant oncologic interval history since the patient was last seen.                                      Allergies:  is allergic to [facility]  dimeglumine].  Meds: Current Outpatient Medications  Medication Sig Dispense Refill   acetaminophen  (TYLENOL ) 500 MG tablet Take 500-1,000 mg by mouth every 6 (six) hours as needed (pain.).     atorvastatin (LIPITOR) 10 MG tablet Take 10 mg by mouth every Monday, Wednesday, and Friday at 8 PM.     Calcium Carbonate-Vitamin D (CALCIUM-VITAMIN D PO) Take 1 tablet by mouth daily.     Multiple Vitamin (MULTIVITAMIN) tablet Take 1 tablet by mouth daily.     No current facility-administered medications for this encounter.    Physical Findings: The patient is in no acute distress. Patient is alert and oriented.  vitals were not taken for this visit. .  No significant changes. Lungs are clear to auscultation bilaterally. Heart has regular rate and rhythm. No palpable cervical, supraclavicular, or axillary adenopathy. Abdomen soft, non-tender, normal bowel sounds.   Lab Findings: Lab Results  Component Value Date   WBC 2.6 (  L) 03/18/2024   HGB 12.1 03/18/2024   HCT 35.9 (L) 03/18/2024   MCV 98.9 03/18/2024   PLT 170 03/18/2024    Radiographic Findings: US  Intraoperative Result Date: 02/24/2024 CLINICAL DATA:  Ultrasound was provided for use by the ordering physician.  No provider Interpretation or professional fees incurred.     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  ***  Plan:   ***   *** minutes of total time was spent for this patient encounter, including preparation, face-to-face counseling with the patient and coordination of care, physical exam, and documentation of the encounter. ____________________________________  Noralee Beam, PhD, MD  This document serves as a record of services personally performed by Retta Caster, MD. It was created on his behalf by Lucky Sable, a trained medical scribe. The creation of this record is based on the scribe's personal observations and the provider's statements to them. This document has been checked and approved by the attending provider.

## 2024-03-25 ENCOUNTER — Ambulatory Visit
Admission: RE | Admit: 2024-03-25 | Discharge: 2024-03-25 | Disposition: A | Discharge status: Home / Self Care | Payer: Self-pay | Source: Ambulatory Visit | Attending: Radiation Oncology | Admitting: Radiation Oncology

## 2024-03-25 ENCOUNTER — Encounter: Payer: Self-pay | Admitting: Radiation Oncology

## 2024-03-25 VITALS — BP 117/76 | HR 81 | Temp 97.7°F | Resp 18 | Ht 60.0 in | Wt 118.1 lb

## 2024-03-25 DIAGNOSIS — Z923 Personal history of irradiation: Secondary | ICD-10-CM | POA: Diagnosis not present

## 2024-03-25 DIAGNOSIS — C530 Malignant neoplasm of endocervix: Secondary | ICD-10-CM | POA: Diagnosis present

## 2024-03-25 HISTORY — DX: Personal history of irradiation: Z92.3

## 2024-03-25 NOTE — Progress Notes (Signed)
 Kelly Thomas is here today for follow up post radiation to the pelvic.  They completed their radiation on: 2024-02-24   Does the patient complain of any of the following:  Pain:No Abdominal bloating: No Diarrhea/Constipation: No Nausea/Vomiting: No Vaginal Discharge: Yes, small amount of yellow discharge in underwear.  Blood in Urine or Stool: No Urinary Issues (dysuria/incomplete emptying/ incontinence/ increased frequency/urgency): Yes, urinary frequency. Does patient report using vaginal dilator 2-3 times a week and/or sexually active 2-3 weeks: Patient was given vaginal dilators S+ and M along with instructions and importance of use. Patient verbalized understanding. Post radiation skin changes: No   Additional comments if applicable:  BP 117/76 (BP Location: Left Arm, Patient Position: Sitting)   Pulse 81   Temp 97.7 F (36.5 C) (Temporal)   Resp 18   Ht 5' (1.524 m)   Wt 118 lb 2 oz (53.6 kg)   SpO2 100%   BMI 23.07 kg/m

## 2024-03-31 ENCOUNTER — Telehealth: Payer: Self-pay | Admitting: Oncology

## 2024-03-31 NOTE — Telephone Encounter (Signed)
 Kelly Thomas called and asked if it is ok for her next port flush appointment to be on 05/25/24 which will be 9 weeks from her last flush.  Advised her that will be fine and then it can be accessed for her PET scan.

## 2024-04-19 ENCOUNTER — Telehealth: Payer: Self-pay | Admitting: Oncology

## 2024-04-19 DIAGNOSIS — R7301 Impaired fasting glucose: Secondary | ICD-10-CM | POA: Diagnosis not present

## 2024-04-19 DIAGNOSIS — M810 Age-related osteoporosis without current pathological fracture: Secondary | ICD-10-CM | POA: Diagnosis not present

## 2024-04-19 DIAGNOSIS — E782 Mixed hyperlipidemia: Secondary | ICD-10-CM | POA: Diagnosis not present

## 2024-04-19 NOTE — Telephone Encounter (Signed)
 Kelly Thomas called and asked if her PET scan scheduled for July has been authorized yet with her insurance.  Advised that it is still pending and that we will make sure it is authorized before 05/25/24.

## 2024-04-26 ENCOUNTER — Other Ambulatory Visit: Payer: Self-pay | Admitting: Hematology and Oncology

## 2024-04-26 DIAGNOSIS — D61818 Other pancytopenia: Secondary | ICD-10-CM

## 2024-04-26 DIAGNOSIS — N183 Chronic kidney disease, stage 3 unspecified: Secondary | ICD-10-CM | POA: Diagnosis not present

## 2024-04-26 DIAGNOSIS — R809 Proteinuria, unspecified: Secondary | ICD-10-CM | POA: Diagnosis not present

## 2024-04-26 DIAGNOSIS — C530 Malignant neoplasm of endocervix: Secondary | ICD-10-CM

## 2024-04-26 DIAGNOSIS — E782 Mixed hyperlipidemia: Secondary | ICD-10-CM | POA: Diagnosis not present

## 2024-04-26 DIAGNOSIS — M545 Low back pain, unspecified: Secondary | ICD-10-CM | POA: Diagnosis not present

## 2024-04-26 DIAGNOSIS — G5603 Carpal tunnel syndrome, bilateral upper limbs: Secondary | ICD-10-CM | POA: Diagnosis not present

## 2024-04-26 DIAGNOSIS — M810 Age-related osteoporosis without current pathological fracture: Secondary | ICD-10-CM | POA: Diagnosis not present

## 2024-04-26 DIAGNOSIS — R7301 Impaired fasting glucose: Secondary | ICD-10-CM | POA: Diagnosis not present

## 2024-04-26 DIAGNOSIS — Z713 Dietary counseling and surveillance: Secondary | ICD-10-CM | POA: Diagnosis not present

## 2024-04-26 DIAGNOSIS — E870 Hyperosmolality and hypernatremia: Secondary | ICD-10-CM | POA: Diagnosis not present

## 2024-05-25 ENCOUNTER — Inpatient Hospital Stay: Attending: Hematology and Oncology

## 2024-05-25 ENCOUNTER — Ambulatory Visit (HOSPITAL_COMMUNITY)
Admission: RE | Admit: 2024-05-25 | Discharge: 2024-05-25 | Disposition: A | Discharge status: Home / Self Care | Source: Ambulatory Visit | Attending: Hematology and Oncology | Admitting: Hematology and Oncology

## 2024-05-25 DIAGNOSIS — R350 Frequency of micturition: Secondary | ICD-10-CM | POA: Diagnosis not present

## 2024-05-25 DIAGNOSIS — D61818 Other pancytopenia: Secondary | ICD-10-CM

## 2024-05-25 DIAGNOSIS — C530 Malignant neoplasm of endocervix: Secondary | ICD-10-CM | POA: Diagnosis present

## 2024-05-25 DIAGNOSIS — D701 Agranulocytosis secondary to cancer chemotherapy: Secondary | ICD-10-CM | POA: Insufficient documentation

## 2024-05-25 DIAGNOSIS — Z923 Personal history of irradiation: Secondary | ICD-10-CM | POA: Diagnosis not present

## 2024-05-25 DIAGNOSIS — Z9221 Personal history of antineoplastic chemotherapy: Secondary | ICD-10-CM | POA: Insufficient documentation

## 2024-05-25 DIAGNOSIS — K649 Unspecified hemorrhoids: Secondary | ICD-10-CM | POA: Diagnosis not present

## 2024-05-25 DIAGNOSIS — R918 Other nonspecific abnormal finding of lung field: Secondary | ICD-10-CM | POA: Diagnosis not present

## 2024-05-25 LAB — CBC WITH DIFFERENTIAL/PLATELET
Abs Immature Granulocytes: 0.01 K/uL (ref 0.00–0.07)
Basophils Absolute: 0 K/uL (ref 0.0–0.1)
Basophils Relative: 1 %
Eosinophils Absolute: 0.1 K/uL (ref 0.0–0.5)
Eosinophils Relative: 2 %
HCT: 38.9 % (ref 36.0–46.0)
Hemoglobin: 13.2 g/dL (ref 12.0–15.0)
Immature Granulocytes: 0 %
Lymphocytes Relative: 13 %
Lymphs Abs: 0.4 K/uL — ABNORMAL LOW (ref 0.7–4.0)
MCH: 31.7 pg (ref 26.0–34.0)
MCHC: 33.9 g/dL (ref 30.0–36.0)
MCV: 93.5 fL (ref 80.0–100.0)
Monocytes Absolute: 0.3 K/uL (ref 0.1–1.0)
Monocytes Relative: 9 %
Neutro Abs: 2.5 K/uL (ref 1.7–7.7)
Neutrophils Relative %: 75 %
Platelets: 155 K/uL (ref 150–400)
RBC: 4.16 MIL/uL (ref 3.87–5.11)
RDW: 12.1 % (ref 11.5–15.5)
WBC: 3.4 K/uL — ABNORMAL LOW (ref 4.0–10.5)
nRBC: 0 % (ref 0.0–0.2)

## 2024-05-25 LAB — COMPREHENSIVE METABOLIC PANEL WITH GFR
ALT: 25 U/L (ref 0–44)
AST: 25 U/L (ref 15–41)
Albumin: 4.2 g/dL (ref 3.5–5.0)
Alkaline Phosphatase: 65 U/L (ref 38–126)
Anion gap: 6 (ref 5–15)
BUN: 13 mg/dL (ref 8–23)
CO2: 28 mmol/L (ref 22–32)
Calcium: 9.3 mg/dL (ref 8.9–10.3)
Chloride: 107 mmol/L (ref 98–111)
Creatinine, Ser: 0.85 mg/dL (ref 0.44–1.00)
GFR, Estimated: >60 mL/min (ref >60)
Glucose, Bld: 97 mg/dL (ref 70–99)
Potassium: 4 mmol/L (ref 3.5–5.1)
Sodium: 141 mmol/L (ref 135–145)
Total Bilirubin: 0.4 mg/dL (ref 0.0–1.2)
Total Protein: 6.4 g/dL — ABNORMAL LOW (ref 6.5–8.1)

## 2024-05-25 LAB — VITAMIN B12: Vitamin B-12: 235 pg/mL (ref 180–914)

## 2024-05-25 LAB — MAGNESIUM: Magnesium: 2 mg/dL (ref 1.7–2.4)

## 2024-05-25 LAB — GLUCOSE, CAPILLARY: Glucose-Capillary: 98 mg/dL (ref 70–99)

## 2024-05-25 MED ORDER — HEPARIN SOD (PORK) LOCK FLUSH 100 UNIT/ML IV SOLN
500.0000 [IU] | Freq: Once | INTRAVENOUS | Status: AC
Start: 2024-05-25 — End: 2024-05-25
  Administered 2024-05-25: 500 [IU]

## 2024-05-25 MED ORDER — FLUDEOXYGLUCOSE F - 18 (FDG) INJECTION
5.9000 | Freq: Once | INTRAVENOUS | Status: AC | PRN
  Administered 2024-05-25: 6.2 via INTRAVENOUS

## 2024-05-25 MED ORDER — SODIUM CHLORIDE 0.9% FLUSH
10.0000 mL | Freq: Once | INTRAVENOUS | Status: AC
  Administered 2024-05-25: 10 mL

## 2024-06-01 ENCOUNTER — Inpatient Hospital Stay: Admitting: Hematology and Oncology

## 2024-06-01 ENCOUNTER — Encounter: Payer: Self-pay | Admitting: Hematology and Oncology

## 2024-06-01 VITALS — BP 145/70 | HR 71 | Temp 97.4°F | Resp 18 | Ht 60.0 in | Wt 125.4 lb

## 2024-06-01 DIAGNOSIS — C530 Malignant neoplasm of endocervix: Secondary | ICD-10-CM | POA: Diagnosis not present

## 2024-06-01 DIAGNOSIS — D701 Agranulocytosis secondary to cancer chemotherapy: Secondary | ICD-10-CM

## 2024-06-01 DIAGNOSIS — K649 Unspecified hemorrhoids: Secondary | ICD-10-CM | POA: Diagnosis not present

## 2024-06-01 DIAGNOSIS — C539 Malignant neoplasm of cervix uteri, unspecified: Secondary | ICD-10-CM | POA: Diagnosis not present

## 2024-06-01 DIAGNOSIS — Z923 Personal history of irradiation: Secondary | ICD-10-CM | POA: Diagnosis not present

## 2024-06-01 DIAGNOSIS — Z9221 Personal history of antineoplastic chemotherapy: Secondary | ICD-10-CM | POA: Diagnosis not present

## 2024-06-01 DIAGNOSIS — R350 Frequency of micturition: Secondary | ICD-10-CM | POA: Diagnosis not present

## 2024-06-01 DIAGNOSIS — T451X5A Adverse effect of antineoplastic and immunosuppressive drugs, initial encounter: Secondary | ICD-10-CM | POA: Insufficient documentation

## 2024-06-01 NOTE — Assessment & Plan Note (Addendum)
 Overall, she is not symptomatic I anticipate full recovery with time away from treatment

## 2024-06-01 NOTE — Assessment & Plan Note (Addendum)
 She has stage II cervical cancer, completed concurrent chemoradiation therapy with weekly cisplatin  Cervical mass biopsy: Poorly differentiated invasive squamous cell carcinoma, PD-L1 95%  Symptoms wise, she has fully recovered from side effects of treatment She has mild persistent leukopenia but not symptomatic Continue supportive care I reviewed multiple imaging studies with the patient and her husband which show excellent response to therapy She is scheduled to see GYN surgeon next week for pelvic exam If confirmed complete response, I recommend port removal We discussed future follow-up

## 2024-06-01 NOTE — Progress Notes (Signed)
 [facility] OFFICE PROGRESS NOTE  Patient Care Team: Shona Norleen PEDLAR, MD as PCP - General (Internal Medicine)  Assessment & Plan Malignant neoplasm of cervix, unspecified site [facility]) She has stage II cervical cancer, completed concurrent chemoradiation therapy with weekly cisplatin  Cervical mass biopsy: Poorly differentiated invasive squamous cell carcinoma, PD-L1 95%  Symptoms wise, she has fully recovered from side effects of treatment She has mild persistent leukopenia but not symptomatic Continue supportive care I reviewed multiple imaging studies with the patient and her husband which show excellent response to therapy She is scheduled to see GYN surgeon next week for pelvic exam If confirmed complete response, I recommend port removal We discussed future follow-up Leukopenia due to antineoplastic chemotherapy (HCC) Overall, she is not symptomatic I anticipate full recovery with time away from treatment  No orders of the defined types were placed in this encounter.    Almarie Bedford, MD  INTERVAL HISTORY: she returns for treatment follow-up after completion of chemo and radiation therapy for cervical cancer Complications related to previous cycle of chemotherapy included Leukopenia and Fatigue She denies recent infection PHYSICAL EXAMINATION: ECOG PERFORMANCE STATUS: 0 - Asymptomatic  Vitals:   06/01/24 0949  BP: (!) 145/70  Pulse: 71  Resp: 18  Temp: (!) 97.4 F (36.3 C)  SpO2: 100%   Filed Weights   06/01/24 0949  Weight: 125 lb 6.4 oz (56.9 kg)    Relevant data reviewed during this visit included CBC, CMP, PET/CT imaging from January 2025 and July 2025

## 2024-06-10 ENCOUNTER — Inpatient Hospital Stay: Admitting: Gynecologic Oncology

## 2024-06-10 ENCOUNTER — Other Ambulatory Visit: Payer: Self-pay | Admitting: Hematology and Oncology

## 2024-06-10 ENCOUNTER — Encounter: Payer: Self-pay | Admitting: Gynecologic Oncology

## 2024-06-10 VITALS — BP 138/65 | HR 78 | Temp 98.9°F | Resp 19 | Wt 128.8 lb

## 2024-06-10 DIAGNOSIS — C539 Malignant neoplasm of cervix uteri, unspecified: Secondary | ICD-10-CM

## 2024-06-10 DIAGNOSIS — R350 Frequency of micturition: Secondary | ICD-10-CM | POA: Diagnosis not present

## 2024-06-10 DIAGNOSIS — Z923 Personal history of irradiation: Secondary | ICD-10-CM | POA: Diagnosis not present

## 2024-06-10 DIAGNOSIS — C530 Malignant neoplasm of endocervix: Secondary | ICD-10-CM | POA: Diagnosis not present

## 2024-06-10 DIAGNOSIS — K649 Unspecified hemorrhoids: Secondary | ICD-10-CM | POA: Diagnosis not present

## 2024-06-10 DIAGNOSIS — D701 Agranulocytosis secondary to cancer chemotherapy: Secondary | ICD-10-CM | POA: Diagnosis not present

## 2024-06-10 DIAGNOSIS — Z9221 Personal history of antineoplastic chemotherapy: Secondary | ICD-10-CM | POA: Diagnosis not present

## 2024-06-10 NOTE — Patient Instructions (Signed)
 It was good to see you today.  I do not see or feel any evidence of cancer recurrence on your exam.  I will see you for follow-up in 6 months.  We will alternate visits every 3 months between our office and Dr. Colton.  As always, if you develop any new and concerning symptoms before your next visit, please call to see me sooner.  The symptoms would include vaginal bleeding, pelvic or abdominal pain, unintentional weight loss, or change to your bowel or bladder habits.  Please continue using your vaginal dilator regularly.  For the hemorrhoids, you may want to try both Preparation H ointment as well as Preparation H suppositories.  I would also make sure you are using something for your bowels like a stool softener to prevent any constipation.

## 2024-06-10 NOTE — Progress Notes (Signed)
 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    11/07/2023 Initial Biopsy   Report available at [facility] Cervical mass biopsy: Poorly differentiated invasive squamous cell carcinoma   11/07/2023 Imaging   Office ultrasound shows a uterus measuring 6.4 x 4 x 2.1 cm with an endometrial lining measuring 0.9 mm with small endometrial fluid.  Normal bilateral adnexa.  Solid, inhomogenous lesion within the cervix measuring 3.8 x 3.2 x 3.8 cm.   11/20/2023 Initial Diagnosis   Cervical cancer (HCC)   12/01/2023 Cancer Staging   Staging form: Cervix Uteri, AJCC Version 9 - Clinical stage from 12/01/2023: FIGO Stage IIB (cT2b, cN0, cM0) - Signed by Lonn Hicks, MD on 12/01/2023 Stage prefix: Initial diagnosis   12/01/2023 PET scan   NM PET Image Initial (PI) Skull Base To Thigh (F-18 FDG) Result Date: 12/01/2023 CLINICAL DATA:  Initial treatment strategy for cervical cancer. EXAM: NUCLEAR MEDICINE PET SKULL BASE TO THIGH TECHNIQUE: 6.8 mCi F-18 FDG was injected intravenously. Full-ring PET imaging was performed from the skull base to thigh after the radiotracer. CT data was obtained and used for attenuation correction and anatomic localization. Fasting blood glucose: 120 mg/dl COMPARISON:  None Available. FINDINGS: Mediastinal blood pool activity: SUV max 2.5 Liver activity: SUV max NA NECK: No abnormal hypermetabolism. Incidental CT findings: None. CHEST: No abnormal hypermetabolism. Incidental CT findings: Atherosclerotic calcification of the aorta and left anterior descending coronary artery. Heart is at the upper limits of normal in size to mildly enlarged. No pericardial or pleural  effusion. ABDOMEN/PELVIS: A mass involving the cervix, and likely lower uterine segment, measures approximately 3.8 x 4.6 cm (4/156), SUV max 25.9. No additional abnormal hypermetabolism. Incidental CT findings: Cholecystectomy. Kidneys appear non rotated, an anatomical variant. Atherosclerotic calcification of the aorta. SKELETON: No abnormal hypermetabolism. Incidental CT findings: Minimal degenerative change in the spine. IMPRESSION: 1. Hypermetabolic cervical mass, compatible with the provided history of cervical cancer. No evidence of metastatic disease. 2. Aortic atherosclerosis (ICD10-I70.0). Coronary artery calcification. Electronically Signed   By: Newell Eke M.D.   On: 12/01/2023 16:15      12/05/2023 Imaging   1. Right lateral cervical mass is identified measuring 4.9 x 4.0 x 4.0 cm. Positive for parametrial invasion. Tumor extension into the upper 2/3 of the vagina is visualized. There is tumor extension up to the rectum with loss of fat plane between the anterior wall of the rectum and the mass. Anterior extension of the tumor extends up to the posterior wall of the bladder with focal area of loss of fat plane seen. Cannot exclude bladder involvement. 2. No signs of hydroureter. 3. No signs of pelvic lymphadenopathy or peritoneal nodularity. 4. Left fundal submucosal fibroid shows diffuse enhancement measuring 2.2 x 2.2 cm   12/11/2023 Procedure   Successful placement of a right IJ approach Power Port with ultrasound and fluoroscopic guidance. The catheter is ready for use.   12/11/2023 - 02/24/2024 Radiation Therapy   First Treatment Date: 2023-12-11 Last Treatment Date: 2024-02-24   Plan Name: Pelvis Site: Pelvis Technique: 3D Mode: Photon Dose Per Fraction: 1.8 Gy Prescribed Dose (Delivered / Prescribed): 9 Gy / 9 Gy Prescribed Fxs (Delivered / Prescribed): 5 / 5   Plan Name: Pelvis_Bst Site: Pelvis Technique:  IMRT Mode: Photon Dose Per Fraction: 1.8  Gy Prescribed Dose (Delivered / Prescribed): 36 Gy / 36 Gy Prescribed Fxs (Delivered / Prescribed): 20 / 20   Plan Name: Cervix_HDR_F1-5 Site: Cervix Technique: HDR Ir-192 Mode: Brachytherapy Dose Per Fraction: 5.5 Gy Prescribed Dose (Delivered / Prescribed): 27.5 Gy / 27.5.5 Gy Prescribed Fxs (Delivered / Prescribed): 5 / 5   Plan Name: Pelvis_Bst2 Site: Pelvis Technique: 3D Mode: Photon Dose Per Fraction: 1.8 Gy Prescribed Dose (Delivered / Prescribed): 9 Gy / 9 Gy Prescribed Fxs (Delivered / Prescribed): 5 / 5   12/19/2023 - 01/16/2024 Chemotherapy   Patient is on Treatment Plan : cervical cancer Cisplatin  (40) q7d     05/25/2024 PET scan   NM PET Image Restage (PS) Skull Base to Thigh (F-18 FDG) Result Date: 05/26/2024 EXAM: NUCLEAR MEDICINE PET SKULL BASE TO THIGH TECHNIQUE: 6.2 mCi F-18 FDG was injected intravenously. Full-ring PET imaging was performed from the skull base to thigh after the radiotracer. CT data was obtained and used for attenuation correction and anatomic localization. Fasting blood glucose: 98 mg/dl COMPARISON:  PET-CT 98/86/7974. MRI pelvis with contrast and without 12/05/2023 FINDINGS: Mediastinal blood pool activity: SUV max 2.0 Liver activity: SUV max 2.7 NECK: No abnormal uptake seen in the neck including along lymph node change of the submandibular, posterior triangle or internal jugular regions. Near symmetric uptake of the intracranial compartment included in the imaging field. Incidental CT findings: The parotid glands, submandibular glands unremarkable. Small thyroid gland. Few scattered vascular calcifications. Paranasal sinuses and mastoid air cells are clear. CHEST: No abnormal uptake above blood pool in the axillary regions, hilum or mediastinum. No abnormal lung parenchymal uptake identified. Incidental CT findings: Right IJ chest port in place with tip extending to the right atrium. The heart is nonenlarged. Coronary artery calcifications are seen. Please  correlate for other coronary risk factors. The thoracic aorta is normal course and caliber with some calcified plaque. Normal caliber thoracic esophagus. Breathing motion. There is some linear opacity at the bases likely scar or atelectasis. Right-sided apical subpleural blebs. ABDOMEN/PELVIS: On the prior there is a large hypermetabolic mass centered in the area of the cervix extending into the right adnexa. Maximum SUV value previously of 25.9 and dimensions of 3.8 x 4.6 cm. Today, the abnormal uptake has essentially resolved with some minimal uptake of maximum SUV value of 2.8 in the area of the right-side of the cervix. There is some mild soft tissue thickening in this location on image 158. No discrete measurable mass lesion identified on the limited noncontrast CT. Otherwise there is physiologic distribution radiotracer along the parenchymal organs, bowel and renal collecting systems. No areas of abnormal nodal uptake identified this time. Incidental CT findings: On this limited noncontrast CT, the liver, adrenal glands and pancreas are unremarkable. Previous cholecystectomy. Spleen is grossly preserved. Scattered vascular calcifications identified along the aorta and branch vessels. Moderate atrophy of the right kidney. Mild left. The extrarenal pelvis seen to each kidney. Preserved contour to the urinary bladder. Moderate colonic stool. Scattered colonic diverticula. Normal appendix in the right lower quadrant with some high attenuation debris. Small bowel is nondilated. SKELETON: No specific abnormal uptake along the visualized osseous structures. Incidental CT findings: Scattered degenerative changes. IMPRESSION: Marked improvement in the area of previous hypermetabolic cervical mass. No significant residual uptake. No new areas of abnormal uptake. Scattered atherosclerotic changes including the coronary arteries. Please correlate for other coronary risk factors. Scattered colonic diverticula.  Electronically Signed   By: Ranell  Charlanne M.D.   On: 05/26/2024 11:56        Interval History: Doing well.  Notes some fatigue.  Denies any abdominal or pelvic pain.  Notes very slight pink when she uses her vaginal dilator, denies any vaginal bleeding.  Endorses normal bowel function.  Has some urinary frequency since treatment, denies other urinary symptoms.  Past Medical/Surgical History: Past Medical History:  Diagnosis Date   Anemia    Anxiety    Blood dyscrasia    pantocytopenia   Cancer (HCC)    endocervical cancer   Depression    History of radiation therapy    Pelvis-12/11/23-02/24/24-Dr. Lynwood Nasuti   Hyperlipidemia     Past Surgical History:  Procedure Laterality Date   CHOLECYSTECTOMY     COLONOSCOPY  04/02/2012   Procedure: COLONOSCOPY;  Surgeon: Lamar CHRISTELLA Hollingshead, MD;  Location: [facility];  Service: Endoscopy;  Laterality: N/A;  11:30 AM   IR IMAGING GUIDED PORT INSERTION  12/11/2023   OPERATIVE ULTRASOUND N/A 01/26/2024   Procedure: US  INTRAOPERATIVE;  Surgeon: Nasuti Lynwood, MD;  Location: [facility];  Service: Urology;  Laterality: N/A;   OPERATIVE ULTRASOUND N/A 02/03/2024   Procedure: US  INTRAOPERATIVE;  Surgeon: Nasuti Lynwood, MD;  Location: [facility];  Service: Urology;  Laterality: N/A;   OPERATIVE ULTRASOUND N/A 02/10/2024   Procedure: US  INTRAOPERATIVE;  Surgeon: Nasuti Lynwood, MD;  Location: [facility];  Service: Urology;  Laterality: N/A;   OPERATIVE ULTRASOUND N/A 02/17/2024   Procedure: US  INTRAOPERATIVE;  Surgeon: Nasuti Lynwood, MD;  Location: [facility];  Service: Urology;  Laterality: N/A;   OPERATIVE ULTRASOUND N/A 02/24/2024   Procedure: US  INTRAOPERATIVE;  Surgeon: Nasuti Lynwood, MD;  Location: [facility];  Service: Urology;  Laterality: N/A;   TANDEM RING INSERTION N/A 01/26/2024   Procedure: INSERTION, UTERINE TANDEM AND RING OR CYLINDER, FOR BRACHYTHERAPY;  Surgeon: Nasuti Lynwood, MD;  Location: [facility];  Service: Urology;  Laterality: N/A;   TANDEM RING INSERTION  N/A 02/03/2024   Procedure: INSERTION, UTERINE TANDEM AND RING OR CYLINDER, FOR BRACHYTHERAPY;  Surgeon: Nasuti Lynwood, MD;  Location: [facility];  Service: Urology;  Laterality: N/A;   TANDEM RING INSERTION N/A 02/10/2024   Procedure: INSERTION, UTERINE TANDEM AND RING, FOR BRACHYTHERAPY;  Surgeon: Nasuti Lynwood, MD;  Location: [facility];  Service: Urology;  Laterality: N/A;   TANDEM RING INSERTION N/A 02/17/2024   Procedure: INSERTION, UTERINE TANDEM AND RING OR CYLINDER, FOR BRACHYTHERAPY;  Surgeon: Nasuti Lynwood, MD;  Location: [facility];  Service: Urology;  Laterality: N/A;   TANDEM RING INSERTION N/A 02/24/2024   Procedure: INSERTION, UTERINE TANDEM AND RING OR CYLINDER, FOR BRACHYTHERAPY;  Surgeon: Nasuti Lynwood, MD;  Location: [facility];  Service: Urology;  Laterality: N/A;    Family History  Problem Relation Age of Onset   Cancer Mother        Uterine/Endometrial   Endometrial cancer Mother    Cancer Brother        throat cancer   Throat cancer Brother    Ovarian cancer Neg Hx    Colon cancer Neg Hx    Pancreatic cancer Neg Hx    Breast cancer Neg Hx    Prostate cancer Neg Hx     Social History   Socioeconomic History   Marital status: Married    Spouse name: Not on file   Number of children: Not on file   Years of education: Not on file   Highest education level: Not on file  Occupational History  Not on file  Tobacco Use   Smoking status: Never    Passive exposure: Past   Smokeless tobacco: Not on file  Vaping Use   Vaping status: Never Used  Substance and Sexual Activity   Alcohol use: No   Drug use: Not Currently   Sexual activity: Yes  Other Topics Concern   Not on file  Social History Narrative   Not on file   Social Drivers of Health   Financial Resource Strain: Not on file  Food Insecurity: No Food Insecurity (12/19/2023)   Hunger Vital Sign    Worried About Running Out of Food in the Last Year: Never true    Ran Out of Food in the Last Year: Never true   Transportation Needs: No Transportation Needs (12/19/2023)   PRAPARE - Administrator, Civil Service (Medical): No    Lack of Transportation (Non-Medical): No  Physical Activity: Not on file  Stress: Not on file  Social Connections: Not on file    Current Medications:  Current Outpatient Medications:    acetaminophen  (TYLENOL ) 500 MG tablet, Take 500-1,000 mg by mouth every 6 (six) hours as needed (pain.)., Disp: , Rfl:    atorvastatin (LIPITOR) 10 MG tablet, Take 10 mg by mouth every Monday, Wednesday, and Friday at 8 PM., Disp: , Rfl:    Calcium Carbonate-Vitamin D (CALCIUM-VITAMIN D PO), Take 1 tablet by mouth daily., Disp: , Rfl:    Multiple Vitamin (MULTIVITAMIN) tablet, Take 1 tablet by mouth daily., Disp: , Rfl:   Review of Systems: + Fatigue, ringing in ears, frequency, joint pain, back pain, numbness, anxiety Denies appetite changes, fevers, chills, unexplained weight changes. Denies hearing loss, neck lumps or masses, mouth sores or voice changes. Denies cough or wheezing.  Denies shortness of breath. Denies chest pain or palpitations. Denies leg swelling. Denies abdominal distention, pain, blood in stools, constipation, diarrhea, nausea, vomiting, or early satiety. Denies pain with intercourse, dysuria, hematuria or incontinence. Denies hot flashes, pelvic pain, vaginal bleeding or vaginal discharge.   Denies muscle pain/cramps. Denies itching, rash, or wounds. Denies dizziness, headaches, or seizures. Denies swollen lymph nodes or glands, denies easy bruising or bleeding. Denies depression, confusion, or decreased concentration.  Physical Exam: BP 138/65 (BP Location: Left Arm, Patient Position: Sitting)   Pulse 78   Temp 98.9 F (37.2 C) (Oral)   Resp 19   Wt 128 lb 12.8 oz (58.4 kg)   SpO2 100%   BMI 25.15 kg/m  General: Alert, oriented, no acute distress. HEENT: Posterior oropharynx clear, sclera anicteric. Chest: Clear to auscultation  bilaterally.  No wheezes or rhonchi. Cardiovascular: Regular rate and rhythm, no murmurs. Abdomen: soft, nontender.  Normoactive bowel sounds.  No masses or hepatosplenomegaly appreciated.   Extremities: Grossly normal range of motion.  Warm, well perfused.  No edema bilaterally. Skin: No rashes or lesions noted. Lymphatics: No cervical, supraclavicular, or inguinal adenopathy. GU: Normal appearing external genitalia without erythema, excoriation, or lesions.  Speculum exam reveals moderate atrophy, radiation changes noted.  Cervix posterior facing with some fluid at the vaginal apex, no blood.  No visible cervical lesions from the anterior cervix that I am able to visualize.  Bimanual exam reveals cervix is soft, no firmness or nodularity.  Rectovaginal exam confirms these findings, no parametrial thickening.  Some nonbleeding hemorrhoids noted.  Laboratory & Radiologic Studies: 05/25/24: PET Marked improvement in the area of previous hypermetabolic cervical mass. No significant residual uptake. No new areas of abnormal uptake. Scattered atherosclerotic  changes including the coronary arteries. Please correlate for other coronary risk factors. Scattered colonic diverticula.  Assessment & Plan: Kelly Thomas is a 68 y.o. 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  Primary chemoRT completed in 02/2024. PDL1 CPS 95%.  Doing well. NED on exam.  Reviewed recent imaging with shows excellent response to treatment.  Exam confirms imaging findings. Will discuss port removal with Dr. Lonn.  Encouraged regular dilator use.  Plan for surveillance visits alternating every 6 months alternating between our office and radiation oncology. I will see the patient in 6 months for follow-up. Reviewed signs and symptoms that should prompt a phone call between visits.  For her hemorrhoids, recommended using bowel regimen as well as Preparation H ointment and  suppositories.  20 minutes of total time was spent for this patient encounter, including preparation, face-to-face counseling with the patient and coordination of care, and documentation of the encounter.  Comer Dollar, MD  Division of Gynecologic Oncology  Department of Obstetrics and Gynecology  [facility]  [facility]

## 2024-06-17 ENCOUNTER — Ambulatory Visit (HOSPITAL_COMMUNITY)
Admission: RE | Admit: 2024-06-17 | Discharge: 2024-06-17 | Disposition: A | Discharge status: Home / Self Care | Source: Ambulatory Visit | Attending: Hematology and Oncology | Admitting: Hematology and Oncology

## 2024-06-17 DIAGNOSIS — C530 Malignant neoplasm of endocervix: Secondary | ICD-10-CM | POA: Diagnosis present

## 2024-06-17 DIAGNOSIS — Z452 Encounter for adjustment and management of vascular access device: Secondary | ICD-10-CM | POA: Diagnosis not present

## 2024-06-17 MED ORDER — LIDOCAINE HCL 1 % IJ SOLN
20.0000 mL | Freq: Once | INTRAMUSCULAR | Status: AC
  Administered 2024-06-17: 3 mL via INTRADERMAL

## 2024-06-17 MED ORDER — LIDOCAINE HCL 1 % IJ SOLN
INTRAMUSCULAR | Status: AC
  Filled 2024-06-17: qty 20

## 2024-06-29 ENCOUNTER — Other Ambulatory Visit (HOSPITAL_COMMUNITY): Payer: Self-pay | Admitting: Internal Medicine

## 2024-06-29 DIAGNOSIS — M810 Age-related osteoporosis without current pathological fracture: Secondary | ICD-10-CM

## 2024-06-29 DIAGNOSIS — Z1231 Encounter for screening mammogram for malignant neoplasm of breast: Secondary | ICD-10-CM

## 2024-08-16 ENCOUNTER — Ambulatory Visit (HOSPITAL_COMMUNITY)
Admission: RE | Admit: 2024-08-16 | Discharge: 2024-08-16 | Disposition: A | Source: Ambulatory Visit | Attending: Internal Medicine | Admitting: Internal Medicine

## 2024-08-16 ENCOUNTER — Ambulatory Visit (HOSPITAL_COMMUNITY)
Admission: RE | Admit: 2024-08-16 | Discharge: 2024-08-16 | Disposition: A | Discharge status: Home / Self Care | Source: Ambulatory Visit | Attending: Internal Medicine | Admitting: Internal Medicine

## 2024-08-16 DIAGNOSIS — Z1231 Encounter for screening mammogram for malignant neoplasm of breast: Secondary | ICD-10-CM | POA: Diagnosis not present

## 2024-08-16 DIAGNOSIS — M810 Age-related osteoporosis without current pathological fracture: Secondary | ICD-10-CM | POA: Insufficient documentation

## 2024-08-16 DIAGNOSIS — Z780 Asymptomatic menopausal state: Secondary | ICD-10-CM | POA: Diagnosis not present

## 2024-08-20 DIAGNOSIS — E782 Mixed hyperlipidemia: Secondary | ICD-10-CM | POA: Diagnosis not present

## 2024-08-20 DIAGNOSIS — R7301 Impaired fasting glucose: Secondary | ICD-10-CM | POA: Diagnosis not present

## 2024-08-20 DIAGNOSIS — M810 Age-related osteoporosis without current pathological fracture: Secondary | ICD-10-CM | POA: Diagnosis not present

## 2024-08-24 NOTE — Progress Notes (Incomplete)
 Radiation Oncology         [phone] ________________________________  Name: Kelly Thomas MRN: [id number]  Date: 08/26/2024  DOB: 23-Jan-1956  Follow-Up Visit Note  CC: Shona Norleen PEDLAR, MD  Shona Norleen PEDLAR, MD  No diagnosis found.  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    Interval Since Last Radiation:  6 months 2 days Intent: Curative  First Treatment Date: 2023-12-11 Last Treatment Date: 2024-02-24   Plan Name: Pelvis Site: Pelvis Technique: 3D Mode: Photon Dose Per Fraction: 1.8 Gy Prescribed Dose (Delivered / Prescribed): 9 Gy / 9 Gy Prescribed Fxs (Delivered / Prescribed): 5 / 5   Plan Name: Pelvis_Bst Site: Pelvis Technique: IMRT Mode: Photon Dose Per Fraction: 1.8 Gy Prescribed Dose (Delivered / Prescribed): 36 Gy / 36 Gy Prescribed Fxs (Delivered / Prescribed): 20 / 20   Plan Name: Cervix_HDR_F1-5 Site: Cervix Technique: HDR Ir-192 Mode: Brachytherapy Dose Per Fraction: 5.5 Gy Prescribed Dose (Delivered / Prescribed): 27.5 Gy / 27.5.5 Gy Prescribed Fxs (Delivered / Prescribed): 5 / 5   Plan Name: Pelvis_Bst2 Site: Pelvis Technique: 3D Mode: Photon Dose Per Fraction: 1.8 Gy Prescribed Dose (Delivered / Prescribed): 9 Gy / 9 Gy Prescribed Fxs (Delivered / Prescribed): 5 / 5   Narrative:  The patient returns today for routine follow-up. She was last seen in office on 03/25/24 for a follow up visit. Patient continued to follow up with their specialists to manage their chronic conditions.   In the interval since she was last seen, she presented for a follow up visit with Dr. Lonn on 06/01/24 during which complete response was confirmed and port removal was recommended.   She presented for a follow up with Dr. Viktoria on 06/10/24 during which she was noted NED on exam.                    She underwent a restaging PET scan on 05/25/24 showing  improvement in the area of previous hypermetabolic cervical  mass. No significant residual uptake. No new areas of abnormal uptake. She also underwent a bilateral screening mammogram on 08/16/24 showing no mammographic evidence of malignancy.   No other significant oncologic interval history since the patient was last seen.                       Allergies:  is allergic to [facility]  dimeglumine].  Meds: Current Outpatient Medications  Medication Sig Dispense Refill   acetaminophen  (TYLENOL ) 500 MG tablet Take 500-1,000 mg by mouth every 6 (six) hours as needed (pain.).     atorvastatin (LIPITOR) 10 MG tablet Take 10 mg by mouth every Monday, Wednesday, and Friday at 8 PM.     Calcium Carbonate-Vitamin D (CALCIUM-VITAMIN D PO) Take 1 tablet by mouth daily.     Multiple Vitamin (MULTIVITAMIN) tablet Take 1 tablet by mouth daily.     No current facility-administered medications for this visit.    Physical Findings: The patient is in no acute distress. Patient is alert and oriented.  vitals were not taken for this visit. .  No significant changes. Lungs are clear to auscultation bilaterally. Heart has regular rate and rhythm. No palpable cervical, supraclavicular, or axillary adenopathy. Abdomen soft, non-tender, normal bowel sounds.   Lab Findings: Lab Results  Component Value Date   WBC 3.4 (L) 05/25/2024   HGB 13.2 05/25/2024   HCT 38.9 05/25/2024   MCV 93.5 05/25/2024   PLT  155 05/25/2024    Radiographic Findings: MM 3D SCREENING MAMMOGRAM BILATERAL BREAST Result Date: 08/18/2024 CLINICAL DATA:  Screening. EXAM: DIGITAL SCREENING BILATERAL MAMMOGRAM WITH TOMOSYNTHESIS AND CAD TECHNIQUE: Bilateral screening digital craniocaudal and mediolateral oblique mammograms were obtained. Bilateral screening digital breast tomosynthesis was performed. The images were evaluated with computer-aided detection. COMPARISON:  Previous exam(s). ACR Breast Density Category b: There are scattered areas of fibroglandular density. FINDINGS: There are no  findings suspicious for malignancy. IMPRESSION: No mammographic evidence of malignancy. A result letter of this screening mammogram will be mailed directly to the patient. RECOMMENDATION: Screening mammogram in one year. (Code:SM-B-01Y) BI-RADS CATEGORY  1: Negative. Electronically Signed   By: Alm Parkins M.D.   On: 08/18/2024 09:01   DG BONE DENSITY (DXA) Result Date: 08/16/2024 EXAM: DUAL X-RAY ABSORPTIOMETRY (DXA) FOR BONE MINERAL DENSITY 08/16/2024 8:58 am CLINICAL DATA:  68 year old Female Postmenopausal. Osteoporosis TECHNIQUE: An axial (e.g., hips, spine) and/or appendicular (e.g., radius) exam was performed, as appropriate, using [employer], sport and exercise at [facility]. Images are obtained for bone mineral density measurement and are not obtained for diagnostic purposes. [id number] Exclusions: L4 due to advanced degenerative changes. COMPARISON:  04/18/2022. FINDINGS: Scan quality: Good. LUMBAR SPINE (L1-L3): BMD (in g/cm2): 0.875 T-score: -2.5 Z-score: -0.8 LEFT FEMORAL NECK: BMD (in g/cm2): 0.613 T-score: -3.1 Z-score: -1.5 LEFT TOTAL HIP: BMD (in g/cm2): 0.637 T-score: -2.9 Z-score: -1.6 RIGHT FEMORAL NECK: BMD (in g/cm2): 0.582 T-score: -3.3 Z-score: -1.7 RIGHT TOTAL HIP: BMD (in g/cm2): 0.635 T-score: -3.0 Z-score: -1.6 DUAL-FEMUR TOTAL MEAN: Rate of change from previous exam: -6.2 % FRAX 10-YEAR PROBABILITY OF FRACTURE: FRAX not reported as the lowest BMD is not in the osteopenia range. IMPRESSION: Osteoporosis based on BMD. Fracture risk is increased. Increased risk is based on low BMD. RECOMMENDATIONS: 1. All patients should optimize calcium and vitamin D intake. 2. Consider FDA-approved medical therapies in postmenopausal women and men aged 49 years and older, based on the following: - A hip or vertebral (clinical or morphometric) fracture - T-score less than or equal to -2.5 and secondary causes have been excluded. - Low bone mass (T-score between -1.0 and -2.5) and a 10-year  probability of a hip fracture greater than or equal to 3% or a 10-year probability of a major osteoporosis-related fracture greater than or equal to 20% based on the [place] -adapted WHO algorithm. - Clinician judgment and/or patient preferences may indicate treatment for people with 10-year fracture probabilities above or below these levels 3. Patients with diagnosis of osteoporosis or at high risk for fracture should have regular bone mineral density tests. For patients eligible for Medicare, routine testing is allowed once every 2 years. The testing frequency can be increased to one year for patients who have rapidly progressing disease, those who are receiving or discontinuing medical therapy to restore bone mass, or have additional risk factors. Electronically Signed   By: Harrietta Sherry M.D.   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  ***  Plan:  ***   *** minutes of total time was spent for this patient encounter, including preparation, face-to-face counseling with the patient and coordination of care, physical exam, and documentation of the encounter. ____________________________________  Lynwood CHARM Nasuti, PhD, MD  This document serves as a record of services personally performed by Lynwood Nasuti, MD. It was created on his behalf by  Safa Kadhim, a trained medical scribe. The creation of this record is based on the scribe's personal observations and the provider's statements to them. This document has been checked and approved by the attending provider.

## 2024-08-25 ENCOUNTER — Other Ambulatory Visit: Payer: Self-pay | Admitting: Internal Medicine

## 2024-08-25 ENCOUNTER — Ambulatory Visit: Admitting: Radiation Oncology

## 2024-08-25 DIAGNOSIS — M545 Low back pain, unspecified: Secondary | ICD-10-CM | POA: Diagnosis not present

## 2024-08-25 DIAGNOSIS — K649 Unspecified hemorrhoids: Secondary | ICD-10-CM | POA: Diagnosis not present

## 2024-08-25 DIAGNOSIS — N183 Chronic kidney disease, stage 3 unspecified: Secondary | ICD-10-CM | POA: Diagnosis not present

## 2024-08-25 DIAGNOSIS — Z0000 Encounter for general adult medical examination without abnormal findings: Secondary | ICD-10-CM | POA: Diagnosis not present

## 2024-08-25 DIAGNOSIS — R7301 Impaired fasting glucose: Secondary | ICD-10-CM | POA: Diagnosis not present

## 2024-08-25 DIAGNOSIS — Z0001 Encounter for general adult medical examination with abnormal findings: Secondary | ICD-10-CM | POA: Diagnosis not present

## 2024-08-25 DIAGNOSIS — D61818 Other pancytopenia: Secondary | ICD-10-CM | POA: Diagnosis not present

## 2024-08-25 DIAGNOSIS — Z23 Encounter for immunization: Secondary | ICD-10-CM | POA: Diagnosis not present

## 2024-08-25 DIAGNOSIS — E782 Mixed hyperlipidemia: Secondary | ICD-10-CM | POA: Diagnosis not present

## 2024-08-25 DIAGNOSIS — M810 Age-related osteoporosis without current pathological fracture: Secondary | ICD-10-CM | POA: Insufficient documentation

## 2024-08-26 ENCOUNTER — Ambulatory Visit
Admission: RE | Admit: 2024-08-26 | Discharge: 2024-08-26 | Disposition: A | Discharge status: Home / Self Care | Source: Ambulatory Visit | Attending: Radiation Oncology | Admitting: Radiation Oncology

## 2024-08-26 ENCOUNTER — Encounter: Payer: Self-pay | Admitting: Radiation Oncology

## 2024-08-26 ENCOUNTER — Encounter: Payer: Self-pay | Admitting: Internal Medicine

## 2024-08-26 ENCOUNTER — Encounter (INDEPENDENT_AMBULATORY_CARE_PROVIDER_SITE_OTHER): Payer: Self-pay | Admitting: *Deleted

## 2024-08-26 VITALS — BP 155/70 | HR 74 | Temp 97.8°F | Resp 18 | Ht 60.0 in | Wt 128.4 lb

## 2024-08-26 DIAGNOSIS — Z8541 Personal history of malignant neoplasm of cervix uteri: Secondary | ICD-10-CM | POA: Diagnosis present

## 2024-08-26 DIAGNOSIS — R350 Frequency of micturition: Secondary | ICD-10-CM | POA: Diagnosis not present

## 2024-08-26 DIAGNOSIS — Z923 Personal history of irradiation: Secondary | ICD-10-CM | POA: Insufficient documentation

## 2024-08-26 DIAGNOSIS — C530 Malignant neoplasm of endocervix: Secondary | ICD-10-CM

## 2024-08-26 NOTE — Progress Notes (Signed)
 Kelly Thomas is here today for follow up post radiation to the pelvis.  They completed their radiation on: 02/24/24   Does the patient complain of any of the following:  Pain:No Abdominal bloating:  No Diarrhea/Constipation: yes, constipation. Patent to start taking stool softeners.  Nausea/Vomiting: no Vaginal Discharge: No Blood in Urine or Stool: No Urinary Issues (dysuria/incomplete emptying/ incontinence/ increased frequency/urgency): Yes, urinary frequency.  Does patient report using vaginal dilator 2-3 times a week and/or sexually active 2-3 weeks:  Yes, reports small amount of blood on dilators after use.  Post radiation skin changes: No   Additional comments if applicable:   BP (!) 155/70 (BP Location: Left Arm, Patient Position: Sitting, Cuff Size: Normal)   Pulse 74   Temp 97.8 F (36.6 C)   Resp 18   Ht 5' (1.524 m)   Wt 128 lb 6.4 oz (58.2 kg)   SpO2 100%   BMI 25.08 kg/m

## 2024-08-27 ENCOUNTER — Other Ambulatory Visit (HOSPITAL_COMMUNITY): Payer: Self-pay | Admitting: Internal Medicine

## 2024-08-30 ENCOUNTER — Telehealth: Payer: Self-pay

## 2024-08-30 ENCOUNTER — Encounter: Payer: Self-pay | Admitting: Internal Medicine

## 2024-08-30 NOTE — Telephone Encounter (Signed)
 Auth Submission: APPROVED Site of care: Site of care: [facility] Payer: [facility] medic[facility] Medication & CPT/J Code(s) submitted: Prolia (Denosumab) [id number] Diagnosis Code:  Route of submission (phone, fax, portal): portal Phone # Fax # Auth type: Buy/Bill PB Units/visits requested: 60mg  q 6months Reference number: [id number] Approval from: 08/30/2024 to 08/30/2025

## 2024-09-02 ENCOUNTER — Encounter: Attending: Internal Medicine | Admitting: Emergency Medicine

## 2024-09-02 VITALS — BP 137/82 | HR 80 | Temp 98.2°F | Resp 16

## 2024-09-02 DIAGNOSIS — M810 Age-related osteoporosis without current pathological fracture: Secondary | ICD-10-CM

## 2024-09-02 MED ORDER — DENOSUMAB 60 MG/ML ~~LOC~~ SOSY
60.0000 mg | PREFILLED_SYRINGE | Freq: Once | SUBCUTANEOUS | Status: AC
  Administered 2024-09-02: 60 mg via SUBCUTANEOUS

## 2024-09-02 NOTE — Progress Notes (Signed)
 Diagnosis: Osteoporosis  Provider:  Shona Salvo MD  Procedure: Injection  Prolia (Denosumab), Dose: 60 mg, Site: subcutaneous, Number of injections: 1  Injection Site(s): Left arm  Post Care: Observation complete.  Discharge: Condition: Good, Destination: Home . AVS Provided  Performed by:  Delon ONEIDA Officer, RN

## 2024-09-06 DIAGNOSIS — M9902 Segmental and somatic dysfunction of thoracic region: Secondary | ICD-10-CM | POA: Diagnosis not present

## 2024-09-06 DIAGNOSIS — M5441 Lumbago with sciatica, right side: Secondary | ICD-10-CM | POA: Diagnosis not present

## 2024-09-06 DIAGNOSIS — M9905 Segmental and somatic dysfunction of pelvic region: Secondary | ICD-10-CM | POA: Diagnosis not present

## 2024-09-06 DIAGNOSIS — M9903 Segmental and somatic dysfunction of lumbar region: Secondary | ICD-10-CM | POA: Diagnosis not present

## 2024-09-09 ENCOUNTER — Telehealth: Payer: Self-pay

## 2024-09-09 NOTE — Telephone Encounter (Signed)
 Who is your primary care physician: Dr.Zack Shona  Reasons for the colonoscopy: screening  Have you had a colonoscopy before?  Yes 2013  Do you have family history of colon cancer? no  Previous colonoscopy with polyps removed? no  Do you have a history colorectal cancer?   no  Are you diabetic? If yes, Type 1 or Type 2?    no  Do you have a prosthetic or mechanical heart valve? no  Do you have a pacemaker/defibrillator?   no  Have you had endocarditis/atrial fibrillation? no  Have you had joint replacement within the last 12 months?  no  Do you tend to be constipated or have to use laxatives? no  Do you have any history of drugs or alchohol?  no  Do you use supplemental oxygen?  no  Have you had a stroke or heart attack within the last 6 months? Not answered  Do you take weight loss medication?  no  For female patients: have you had a hysterectomy?  No                                      are you post menopausal?       yes                                            do you still have your menstrual cycle? no      Do you take any blood-thinning medications such as: (aspirin, warfarin, Plavix, Aggrenox)  no  If yes we need the name, milligram, dosage and who is prescribing doctor  Current Outpatient Medications on File Prior to Visit  Medication Sig Dispense Refill   atorvastatin (LIPITOR) 10 MG tablet Take 10 mg by mouth every Monday, Wednesday, and Friday at 8 PM.     Calcium Carbonate-Vitamin D (CALCIUM-VITAMIN D PO) Take 1 tablet by mouth daily.     Cholecalciferol (VITAMIN D) 50 MCG (2000 UT) tablet Take 2,000 Units by mouth daily.     Multiple Vitamin (MULTIVITAMIN) tablet Take 1 tablet by mouth daily.     No current facility-administered medications on file prior to visit.    Allergies  Allergen Reactions   Emend [Fosaprepitant  Dimeglumine] Rash    See progress note from 12/25/23     Pharmacy: [facility] Insurance Name: [facility]  [id number]  Best number where you can be reached: [phone] or [phone]

## 2024-09-13 DIAGNOSIS — M9905 Segmental and somatic dysfunction of pelvic region: Secondary | ICD-10-CM | POA: Diagnosis not present

## 2024-09-13 DIAGNOSIS — M9903 Segmental and somatic dysfunction of lumbar region: Secondary | ICD-10-CM | POA: Diagnosis not present

## 2024-09-13 DIAGNOSIS — M9902 Segmental and somatic dysfunction of thoracic region: Secondary | ICD-10-CM | POA: Diagnosis not present

## 2024-09-13 DIAGNOSIS — M62830 Muscle spasm of back: Secondary | ICD-10-CM | POA: Diagnosis not present

## 2024-10-04 MED ORDER — PEG 3350-KCL-NA BICARB-NACL 420 G PO SOLR: 4000.0000 mL | Freq: Once | ORAL | 0 refills | Status: AC

## 2024-10-04 NOTE — Addendum Note (Signed)
 Addended by: JEANELL GRAEME RAMAN on: 10/04/2024 01:58 PM   Modules accepted: Orders

## 2024-10-04 NOTE — Telephone Encounter (Signed)
 Ok to schedule  ASA 2

## 2024-10-04 NOTE — Telephone Encounter (Signed)
 Spoke with pt and scheduled for 12/5 with Dr. Shaaron. Aware will mail instructions and send prep rx to the pharmacy

## 2024-10-05 ENCOUNTER — Encounter (INDEPENDENT_AMBULATORY_CARE_PROVIDER_SITE_OTHER): Payer: Self-pay | Admitting: *Deleted

## 2024-10-05 NOTE — Telephone Encounter (Signed)
 Referral completed, [facility] apt letter sent to PCP

## 2024-10-22 ENCOUNTER — Ambulatory Visit (HOSPITAL_COMMUNITY)
Admission: RE | Admit: 2024-10-22 | Discharge: 2024-10-22 | Disposition: A | Attending: Internal Medicine | Admitting: Internal Medicine

## 2024-10-22 ENCOUNTER — Encounter (HOSPITAL_COMMUNITY): Admission: RE | Disposition: A | Payer: Self-pay | Source: Home / Self Care | Attending: Internal Medicine

## 2024-10-22 ENCOUNTER — Other Ambulatory Visit: Payer: Self-pay

## 2024-10-22 ENCOUNTER — Ambulatory Visit (HOSPITAL_COMMUNITY): Admitting: Certified Registered"

## 2024-10-22 ENCOUNTER — Encounter (HOSPITAL_COMMUNITY): Payer: Self-pay | Admitting: Internal Medicine

## 2024-10-22 DIAGNOSIS — K5730 Diverticulosis of large intestine without perforation or abscess without bleeding: Secondary | ICD-10-CM | POA: Diagnosis not present

## 2024-10-22 DIAGNOSIS — K641 Second degree hemorrhoids: Secondary | ICD-10-CM

## 2024-10-22 DIAGNOSIS — D123 Benign neoplasm of transverse colon: Secondary | ICD-10-CM | POA: Diagnosis not present

## 2024-10-22 DIAGNOSIS — Z1211 Encounter for screening for malignant neoplasm of colon: Secondary | ICD-10-CM | POA: Diagnosis not present

## 2024-10-22 SURGERY — COLONOSCOPY
Anesthesia: General

## 2024-10-22 MED ORDER — LACTATED RINGERS IV SOLN: INTRAVENOUS | Status: DC

## 2024-10-22 MED ORDER — PROPOFOL 10 MG/ML IV BOLUS
INTRAVENOUS | Status: DC | PRN
  Administered 2024-10-22: 100 mg via INTRAVENOUS
  Administered 2024-10-22: 150 ug/kg/min via INTRAVENOUS

## 2024-10-22 MED ORDER — LIDOCAINE HCL (CARDIAC) PF 100 MG/5ML IV SOSY
PREFILLED_SYRINGE | INTRAVENOUS | Status: DC | PRN
  Administered 2024-10-22: 50 mg via INTRAVENOUS

## 2024-10-22 NOTE — Anesthesia Postprocedure Evaluation (Signed)
 Anesthesia Post Note  Patient: COLLIE KITTEL  Procedure(s) Performed: COLONOSCOPY POLYPECTOMY, INTESTINE  Patient location during evaluation: PACU Anesthesia Type: General Level of consciousness: awake and alert Pain management: pain level controlled Vital Signs Assessment: post-procedure vital signs reviewed and stable Respiratory status: spontaneous breathing, nonlabored ventilation, respiratory function stable and patient connected to nasal cannula oxygen Cardiovascular status: blood pressure returned to baseline and stable Postop Assessment: no apparent nausea or vomiting Anesthetic complications: no   No notable events documented.   Last Vitals:  Vitals:   10/22/24 1001 10/22/24 1059  BP: 130/73 (!) 94/48  Pulse: 97 85  Resp: (!) 22 16  Temp: 36.7 C 36.6 C  SpO2: 98% 99%    Last Pain:  Vitals:   10/22/24 1059  TempSrc: Oral  PainSc: 0-No pain                 Andrea Limes

## 2024-10-22 NOTE — Transfer of Care (Signed)
 Immediate Anesthesia Transfer of Care Note  Patient: Kelly Thomas  Procedure(s) Performed: COLONOSCOPY POLYPECTOMY, INTESTINE  Patient Location: Endoscopy Unit  Anesthesia Type:General  Level of Consciousness: drowsy, patient cooperative, and responds to stimulation  Airway & Oxygen Therapy: Patient Spontanous Breathing  Post-op Assessment: Report given to RN and Post -op Vital signs reviewed and stable  Post vital signs: Reviewed and stable  Last Vitals:  Vitals Value Taken Time  BP 94/48 10/22/24 10:59  Temp 36.6 C 10/22/24 10:59  Pulse 85 10/22/24 10:59  Resp 16 10/22/24 10:59  SpO2 99 % 10/22/24 10:59    Last Pain:  Vitals:   10/22/24 1059  TempSrc: Oral  PainSc: 0-No pain      Patients Stated Pain Goal: 4 (10/22/24 1001)  Complications: No notable events documented.

## 2024-10-22 NOTE — Anesthesia Preprocedure Evaluation (Signed)
 Anesthesia Evaluation    Airway Mallampati: II  TM Distance: >3 FB Neck ROM: Full    Dental no notable dental hx.    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   Anesthesia Other Findings   Reproductive/Obstetrics                              Anesthesia Physical Anesthesia Plan  ASA: 2  Anesthesia Plan: General   Post-op Pain Management:    Induction: Intravenous  PONV Risk Score and Plan:   Airway Management Planned: Nasal Cannula  Additional Equipment:   Intra-op Plan:   Post-operative Plan:   Informed Consent: I have reviewed the patients History and Physical, chart, labs and discussed the procedure including the risks, benefits and alternatives for the proposed anesthesia with the patient or authorized representative who has indicated his/her understanding and acceptance.     Dental advisory given  Plan Discussed with: CRNA  Anesthesia Plan Comments:         Anesthesia Quick Evaluation

## 2024-10-22 NOTE — Op Note (Signed)
 [facility] Patient Name: Kelly Thomas Procedure Date: 10/22/2024 10:08 AM MRN: [id number] Date of Birth: 17-Jul-1956 Attending MD: Lamar Ozell Hollingshead , MD, [id number] CSN: [id number] Age: 68 Admit Type: Outpatient Procedure:                Colonoscopy Indications:              Screening for colorectal malignant neoplasm Providers:                Lamar Ozell Hollingshead, MD, Jon LABOR. Gerome RN, RN,                            Daphne Mulch Technician, Pensions Consultant Referring MD:              Medicines:                Propofol  per Anesthesia Complications:            No immediate complications. Estimated Blood Loss:     Estimated blood loss was minimal. Procedure:                Pre-Anesthesia Assessment:                           - Prior to the procedure, a History and Physical                            was performed, and patient medications and                            allergies were reviewed. The patient's tolerance of                            previous anesthesia was also reviewed. The risks                            and benefits of the procedure and the sedation                            options and risks were discussed with the patient.                            All questions were answered, and informed consent                            was obtained. Prior Anticoagulants: The patient has                            taken no anticoagulant or antiplatelet agents. ASA                            Grade Assessment: III - A patient with severe                            systemic disease. After reviewing the risks and  benefits, the patient was deemed in satisfactory                            condition to undergo the procedure.                           After obtaining informed consent, the colonoscope                            was passed under direct vision. Throughout the                            procedure, the patient's blood pressure, pulse, and                             oxygen saturations were monitored continuously. The                            [id number]-[id number] ([id number]) Peds Colon was introduced                            through the anus and advanced to the the cecum,                            identified by appendiceal orifice and ileocecal                            valve. The colonoscopy was performed without                            difficulty. The patient tolerated the procedure                            well. The quality of the bowel preparation was                            adequate. The ileocecal valve, appendiceal orifice,                            and rectum were photographed. Scope In: 10:36:20 AM Scope Out: 10:56:51 AM Scope Withdrawal Time: 0 hours 9 minutes 55 seconds  Total Procedure Duration: 0 hours 20 minutes 31 seconds  Findings:      The perianal and digital rectal examinations were normal.      Scattered large-mouthed and medium-mouthed diverticula were found in the       entire colon.      Two sessile polyps were found in the hepatic flexure. The polyps were 3       to 4 mm in size. These polyps were removed with a cold snare. Resection       and retrieval were complete. Estimated blood loss: none.      Non-bleeding external and internal hemorrhoids were found during       retroflexion. The hemorrhoids were mild, small and Grade II (internal       hemorrhoids that prolapse but reduce spontaneously).  The exam was otherwise without abnormality on direct and retroflexion       views. Impression:               - Diverticulosis in the entire examined colon.                           - Two 3 to 4 mm polyps at the hepatic flexure,                            removed with a cold snare. Resected and retrieved.                           - Non-bleeding external and internal hemorrhoids.                           - The examination was otherwise normal on direct                            and retroflexion  views. Moderate Sedation:      Moderate (conscious) sedation was personally administered by an       anesthesia professional. The following parameters were monitored: oxygen       saturation, heart rate, blood pressure, respiratory rate, EKG, adequacy       of pulmonary ventilation, and response to care. Recommendation:           - Patient has a contact number available for                            emergencies. The signs and symptoms of potential                            delayed complications were discussed with the                            patient. Return to normal activities tomorrow.                            Written discharge instructions were provided to the                            patient.                           - Advance diet as tolerated.                           - Continue present medications.                           - Repeat colonoscopy date to be determined after                            pending pathology results are reviewed for  surveillance.                           - Return to GI office (date not yet determined). Procedure Code(s):        --- Professional ---                           [phone], Colonoscopy, flexible; with removal of                            tumor(s), polyp(s), or other lesion(s) by snare                            technique Diagnosis Code(s):        --- Professional ---                           Z12.11, Encounter for screening for malignant                            neoplasm of colon                           K64.1, Second degree hemorrhoids                           D12.3, Benign neoplasm of transverse colon (hepatic                            flexure or splenic flexure)                           K57.30, Diverticulosis of large intestine without                            perforation or abscess without bleeding CPT copyright 2022 American Medical Association. All rights reserved. The codes documented in this  report are preliminary and upon coder review may  be revised to meet current compliance requirements. Lamar HERO. Devine Klingel, MD Lamar Ozell Hollingshead, MD 10/22/2024 11:11:28 AM This report has been signed electronically. Number of Addenda: 0

## 2024-10-22 NOTE — H&P (Signed)
 @LOGO @   Primary Care Physician:  Shona Norleen PEDLAR, MD Primary Gastroenterologist:  Dr.   Shaaron  Pre-Procedure History & Physical: HPI:  Kelly Thomas is a 68 y.o. female is here for a screening colonoscopy.  colonoscopy 2013 yielded only left-sided diverticulosis.  Past Medical History:  Diagnosis Date   Anemia    Anxiety    Blood dyscrasia    pantocytopenia   Cancer (HCC)    endocervical cancer   Depression    History of radiation therapy    Pelvis-12/11/23-02/24/24-Dr. Lynwood Nasuti   Hyperlipidemia     Past Surgical History:  Procedure Laterality Date   CHOLECYSTECTOMY     COLONOSCOPY  04/02/2012   Procedure: COLONOSCOPY;  Surgeon: Lamar CHRISTELLA Shaaron, MD;  Location: [facility];  Service: Endoscopy;  Laterality: N/A;  11:30 AM   IR IMAGING GUIDED PORT INSERTION  12/11/2023   IR REMOVAL TUN ACCESS W/ PORT W/O FL MOD SED  06/17/2024   OPERATIVE ULTRASOUND N/A 01/26/2024   Procedure: US  INTRAOPERATIVE;  Surgeon: Nasuti Lynwood, MD;  Location: [facility];  Service: Urology;  Laterality: N/A;   OPERATIVE ULTRASOUND N/A 02/03/2024   Procedure: US  INTRAOPERATIVE;  Surgeon: Nasuti Lynwood, MD;  Location: [facility];  Service: Urology;  Laterality: N/A;   OPERATIVE ULTRASOUND N/A 02/10/2024   Procedure: US  INTRAOPERATIVE;  Surgeon: Nasuti Lynwood, MD;  Location: [facility];  Service: Urology;  Laterality: N/A;   OPERATIVE ULTRASOUND N/A 02/17/2024   Procedure: US  INTRAOPERATIVE;  Surgeon: Nasuti Lynwood, MD;  Location: [facility];  Service: Urology;  Laterality: N/A;   OPERATIVE ULTRASOUND N/A 02/24/2024   Procedure: US  INTRAOPERATIVE;  Surgeon: Nasuti Lynwood, MD;  Location: [facility];  Service: Urology;  Laterality: N/A;   TANDEM RING INSERTION N/A 01/26/2024   Procedure: INSERTION, UTERINE TANDEM AND RING OR CYLINDER, FOR BRACHYTHERAPY;  Surgeon: Nasuti Lynwood, MD;  Location: [facility];  Service: Urology;  Laterality: N/A;   TANDEM RING INSERTION N/A 02/03/2024   Procedure: INSERTION, UTERINE TANDEM AND RING OR  CYLINDER, FOR BRACHYTHERAPY;  Surgeon: Nasuti Lynwood, MD;  Location: [facility];  Service: Urology;  Laterality: N/A;   TANDEM RING INSERTION N/A 02/10/2024   Procedure: INSERTION, UTERINE TANDEM AND RING, FOR BRACHYTHERAPY;  Surgeon: Nasuti Lynwood, MD;  Location: [facility];  Service: Urology;  Laterality: N/A;   TANDEM RING INSERTION N/A 02/17/2024   Procedure: INSERTION, UTERINE TANDEM AND RING OR CYLINDER, FOR BRACHYTHERAPY;  Surgeon: Nasuti Lynwood, MD;  Location: [facility];  Service: Urology;  Laterality: N/A;   TANDEM RING INSERTION N/A 02/24/2024   Procedure: INSERTION, UTERINE TANDEM AND RING OR CYLINDER, FOR BRACHYTHERAPY;  Surgeon: Nasuti Lynwood, MD;  Location: [facility];  Service: Urology;  Laterality: N/A;    Prior to Admission medications   Medication Sig Start Date End Date Taking? Authorizing Provider  atorvastatin (LIPITOR) 10 MG tablet Take 10 mg by mouth every Monday, Wednesday, and Friday at 8 PM. 10/27/23  Yes [provider]  Calcium Carbonate-Vitamin D (CALCIUM-VITAMIN D PO) Take 1 tablet by mouth daily.   Yes [provider]  Cholecalciferol (VITAMIN D) 50 MCG (2000 UT) tablet Take 2,000 Units by mouth daily.   Yes [provider]  Multiple Vitamin (MULTIVITAMIN) tablet Take 1 tablet by mouth daily.   Yes [provider]    Allergies as of 10/04/2024 - Review Complete 09/09/2024  Allergen Reaction Noted   Emend [fosaprepitant  dimeglumine] Rash 12/25/2023    Family History  Problem Relation Age of Onset   Cancer  Mother        Uterine/Endometrial   Endometrial cancer Mother    Cancer Brother        throat cancer   Throat cancer Brother    Ovarian cancer Neg Hx    Colon cancer Neg Hx    Pancreatic cancer Neg Hx    Breast cancer Neg Hx    Prostate cancer Neg Hx     Social History   Socioeconomic History   Marital status: Married    Spouse name: Not on file   Number of children: Not on file   Years of education: Not on file   Highest  education level: Not on file  Occupational History   Not on file  Tobacco Use   Smoking status: Never    Passive exposure: Past   Smokeless tobacco: Not on file  Vaping Use   Vaping status: Never Used  Substance and Sexual Activity   Alcohol use: No   Drug use: Not Currently   Sexual activity: Yes  Other Topics Concern   Not on file  Social History Narrative   Not on file   Social Drivers of Health   Financial Resource Strain: Not on file  Food Insecurity: No Food Insecurity (12/19/2023)   Hunger Vital Sign    Worried About Running Out of Food in the Last Year: Never true    Ran Out of Food in the Last Year: Never true  Transportation Needs: No Transportation Needs (12/19/2023)   PRAPARE - Administrator, Civil Service (Medical): No    Lack of Transportation (Non-Medical): No  Physical Activity: Not on file  Stress: Not on file  Social Connections: Not on file  Intimate Partner Violence: Not At Risk (12/03/2023)   Humiliation, Afraid, Rape, and Kick questionnaire    Fear of Current or Ex-Partner: No    Emotionally Abused: No    Physically Abused: No    Sexually Abused: No    Review of Systems: See HPI, otherwise negative ROS  Physical Exam: BP 130/73   Pulse 97   Temp 98 F (36.7 C) (Oral)   Resp (!) 22   SpO2 98%  General:   Alert,  Well-developed, well-nourished, pleasant and cooperative in NAD  Heart:  Regular rate and rhythm; no murmurs, clicks, rubs,  or gallops. Abdomen:  Soft, nontender and nondistended. No masses, hepatosplenomegaly or hernias noted. Normal bowel sounds, without guarding, and without rebound.   Msk:  Symmetrical without gross deformities. Normal posture. Pulses:  Normal pulses noted. Extremities:  Without clubbing or edema.  Kelly Thomas is now here to undergo a screening colonoscopy.    Average risk examination.  Risks, benefits, limitations, imponderables and alternatives regarding colonoscopy have been reviewed with the  patient. Questions have been answered. All parties agreeable.     Notice:  This dictation was prepared with Dragon dictation along with smaller phrase technology. Any transcriptional errors that result from this process are unintentional and may not be corrected upon review.

## 2024-10-22 NOTE — Discharge Instructions (Addendum)
  Colonoscopy Discharge Instructions  Read the instructions outlined below and refer to this sheet in the next few weeks. These discharge instructions provide you with general information on caring for yourself after you leave the hospital. Your doctor may also give you specific instructions. While your treatment has been planned according to the most current medical practices available, unavoidable complications occasionally occur. If you have any problems or questions after discharge, call Dr. Shaaron at [phone]. ACTIVITY You may resume your regular activity, but move at a slower pace for the next 24 hours.  Take frequent rest periods for the next 24 hours.  Walking will help get rid of the air and reduce the bloated feeling in your belly (abdomen).  No driving for 24 hours (because of the medicine (anesthesia) used during the test).   Do not sign any important legal documents or operate any machinery for 24 hours (because of the anesthesia used during the test).  NUTRITION Drink plenty of fluids.  You may resume your normal diet as instructed by your doctor.  Begin with a light meal and progress to your normal diet. Heavy or fried foods are harder to digest and may make you feel sick to your stomach (nauseated).  Avoid alcoholic beverages for 24 hours or as instructed.  MEDICATIONS You may resume your normal medications unless your doctor tells you otherwise.  WHAT YOU CAN EXPECT TODAY Some feelings of bloating in the abdomen.  Passage of more gas than usual.  Spotting of blood in your stool or on the toilet paper.  IF YOU HAD POLYPS REMOVED DURING THE COLONOSCOPY: No aspirin products for 7 days or as instructed.  No alcohol for 7 days or as instructed.  Eat a soft diet for the next 24 hours.  FINDING OUT THE RESULTS OF YOUR TEST Not all test results are available during your visit. If your test results are not back during the visit, make an appointment with your caregiver to find out the  results. Do not assume everything is normal if you have not heard from your caregiver or the medical facility. It is important for you to follow up on all of your test results.  SEEK IMMEDIATE MEDICAL ATTENTION IF: You have more than a spotting of blood in your stool.  Your belly is swollen (abdominal distention).  You are nauseated or vomiting.  You have a temperature over 101.  You have abdominal pain or discomfort that is severe or gets worse throughout the day.      Diverticulosis found.  2 small polyps found and removed.    Further recommendations to follow pending review of pathology report

## 2024-10-25 ENCOUNTER — Encounter (HOSPITAL_COMMUNITY): Payer: Self-pay | Admitting: Internal Medicine

## 2024-10-25 LAB — SURGICAL PATHOLOGY

## 2024-10-26 ENCOUNTER — Ambulatory Visit: Payer: Self-pay | Admitting: Internal Medicine

## 2024-12-10 ENCOUNTER — Encounter: Payer: Self-pay | Admitting: Gynecologic Oncology

## 2024-12-10 ENCOUNTER — Inpatient Hospital Stay: Attending: Gynecologic Oncology | Admitting: Gynecologic Oncology

## 2024-12-10 VITALS — BP 131/76 | HR 86 | Temp 98.2°F | Resp 19 | Wt 128.4 lb

## 2024-12-10 DIAGNOSIS — Z923 Personal history of irradiation: Secondary | ICD-10-CM | POA: Insufficient documentation

## 2024-12-10 DIAGNOSIS — Z124 Encounter for screening for malignant neoplasm of cervix: Secondary | ICD-10-CM | POA: Insufficient documentation

## 2024-12-10 DIAGNOSIS — Z08 Encounter for follow-up examination after completed treatment for malignant neoplasm: Secondary | ICD-10-CM | POA: Diagnosis not present

## 2024-12-10 DIAGNOSIS — Z8541 Personal history of malignant neoplasm of cervix uteri: Secondary | ICD-10-CM | POA: Diagnosis not present

## 2024-12-10 DIAGNOSIS — N952 Postmenopausal atrophic vaginitis: Secondary | ICD-10-CM | POA: Diagnosis not present

## 2024-12-10 DIAGNOSIS — Z9221 Personal history of antineoplastic chemotherapy: Secondary | ICD-10-CM | POA: Insufficient documentation

## 2024-12-10 DIAGNOSIS — Z1151 Encounter for screening for human papillomavirus (HPV): Secondary | ICD-10-CM | POA: Insufficient documentation

## 2024-12-10 DIAGNOSIS — C539 Malignant neoplasm of cervix uteri, unspecified: Secondary | ICD-10-CM

## 2024-12-10 MED ORDER — ESTROGENS CONJUGATED 0.625 MG/GM VA CREA: 1.0000 | TOPICAL_CREAM | VAGINAL | 1 refills | Status: AC

## 2024-12-10 NOTE — Patient Instructions (Signed)
 It was good to see you today.  I do not see or feel any evidence of cancer recurrence on your exam.  I will see you for follow-up in 6 months.  We will contact you with your Pap and HPV testing results next week.  I sent a prescription in for vaginal estrogen, which will hopefully help to stop the little bit of spotting that you have been having.  When you get the prescription, throw the applicator away.  Put some on your fingertip and insert this into the vagina at night twice a week.  You can do this after you use the vaginal dilator or you can do it on nights that you do not use the vaginal dilator.  As always, if you develop any new and concerning symptoms before your next visit, please call to see me sooner.

## 2024-12-10 NOTE — Progress Notes (Signed)
 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    11/07/2023 Initial Biopsy   Report available at [facility] Cervical mass biopsy: Poorly differentiated invasive squamous cell carcinoma   11/07/2023 Imaging   Office ultrasound shows a uterus measuring 6.4 x 4 x 2.1 cm with an endometrial lining measuring 0.9 mm with small endometrial fluid.  Normal bilateral adnexa.  Solid, inhomogenous lesion within the cervix measuring 3.8 x 3.2 x 3.8 cm.   11/20/2023 Initial Diagnosis   Cervical cancer (HCC)   12/01/2023 Cancer Staging   Staging form: Cervix Uteri, AJCC Version 9 - Clinical stage from 12/01/2023: FIGO Stage IIB (cT2b, cN0, cM0) - Signed by Lonn Hicks, MD on 12/01/2023 Stage prefix: Initial diagnosis   12/01/2023 PET scan   NM PET Image Initial (PI) Skull Base To Thigh (F-18 FDG) Result Date: 12/01/2023 CLINICAL DATA:  Initial treatment strategy for cervical cancer. EXAM: NUCLEAR MEDICINE PET SKULL BASE TO THIGH TECHNIQUE: 6.8 mCi F-18 FDG was injected intravenously. Full-ring PET imaging was performed from the skull base to thigh after the radiotracer. CT data was obtained and used for attenuation correction and anatomic localization. Fasting blood glucose: 120 mg/dl COMPARISON:  None Available. FINDINGS: Mediastinal blood pool activity: SUV max 2.5 Liver activity: SUV max NA NECK: No abnormal hypermetabolism. Incidental CT findings: None. CHEST: No abnormal hypermetabolism. Incidental CT findings: Atherosclerotic calcification of the aorta and left anterior descending coronary artery. Heart is at the upper limits of normal in size to mildly enlarged. No pericardial or pleural  effusion. ABDOMEN/PELVIS: A mass involving the cervix, and likely lower uterine segment, measures approximately 3.8 x 4.6 cm (4/156), SUV max 25.9. No additional abnormal hypermetabolism. Incidental CT findings: Cholecystectomy. Kidneys appear non rotated, an anatomical variant. Atherosclerotic calcification of the aorta. SKELETON: No abnormal hypermetabolism. Incidental CT findings: Minimal degenerative change in the spine. IMPRESSION: 1. Hypermetabolic cervical mass, compatible with the provided history of cervical cancer. No evidence of metastatic disease. 2. Aortic atherosclerosis (ICD10-I70.0). Coronary artery calcification. Electronically Signed   By: Newell Eke M.D.   On: 12/01/2023 16:15      12/05/2023 Imaging   1. Right lateral cervical mass is identified measuring 4.9 x 4.0 x 4.0 cm. Positive for parametrial invasion. Tumor extension into the upper 2/3 of the vagina is visualized. There is tumor extension up to the rectum with loss of fat plane between the anterior wall of the rectum and the mass. Anterior extension of the tumor extends up to the posterior wall of the bladder with focal area of loss of fat plane seen. Cannot exclude bladder involvement. 2. No signs of hydroureter. 3. No signs of pelvic lymphadenopathy or peritoneal nodularity. 4. Left fundal submucosal fibroid shows diffuse enhancement measuring 2.2 x 2.2 cm   12/11/2023 Procedure   Successful placement of a right IJ approach Power Port with ultrasound and fluoroscopic guidance. The catheter is ready for use.   12/11/2023 - 02/24/2024 Radiation Therapy   First Treatment Date: 2023-12-11 Last Treatment Date: 2024-02-24   Plan Name: Pelvis Site: Pelvis Technique: 3D Mode: Photon Dose Per Fraction: 1.8 Gy Prescribed Dose (Delivered / Prescribed): 9 Gy / 9 Gy Prescribed Fxs (Delivered / Prescribed): 5 / 5   Plan Name: Pelvis_Bst Site: Pelvis  Technique: IMRT Mode: Photon Dose Per Fraction: 1.8  Gy Prescribed Dose (Delivered / Prescribed): 36 Gy / 36 Gy Prescribed Fxs (Delivered / Prescribed): 20 / 20   Plan Name: Cervix_[id number] Site: Cervix Technique: HDR Ir-192 Mode: Brachytherapy Dose Per Fraction: 5.5 Gy Prescribed Dose (Delivered / Prescribed): 27.5 Gy / 27.5.5 Gy Prescribed Fxs (Delivered / Prescribed): 5 / 5   Plan Name: Pelvis_Bst2 Site: Pelvis Technique: 3D Mode: Photon Dose Per Fraction: 1.8 Gy Prescribed Dose (Delivered / Prescribed): 9 Gy / 9 Gy Prescribed Fxs (Delivered / Prescribed): 5 / 5   12/19/2023 - 01/16/2024 Chemotherapy   Patient is on Treatment Plan : cervical cancer Cisplatin  (40) q7d     05/25/2024 PET scan   NM PET Image Restage (PS) Skull Base to Thigh (F-18 FDG) Result Date: 05/26/2024 EXAM: NUCLEAR MEDICINE PET SKULL BASE TO THIGH TECHNIQUE: 6.2 mCi F-18 FDG was injected intravenously. Full-ring PET imaging was performed from the skull base to thigh after the radiotracer. CT data was obtained and used for attenuation correction and anatomic localization. Fasting blood glucose: 98 mg/dl COMPARISON:  PET-CT 98/86/7974. MRI pelvis with contrast and without 12/05/2023 FINDINGS: Mediastinal blood pool activity: SUV max 2.0 Liver activity: SUV max 2.7 NECK: No abnormal uptake seen in the neck including along lymph node change of the submandibular, posterior triangle or internal jugular regions. Near symmetric uptake of the intracranial compartment included in the imaging field. Incidental CT findings: The parotid glands, submandibular glands unremarkable. Small thyroid gland. Few scattered vascular calcifications. Paranasal sinuses and mastoid air cells are clear. CHEST: No abnormal uptake above blood pool in the axillary regions, hilum or mediastinum. No abnormal lung parenchymal uptake identified. Incidental CT findings: Right IJ chest port in place with tip extending to the right atrium. The heart is nonenlarged. Coronary artery calcifications are seen. Please  correlate for other coronary risk factors. The thoracic aorta is normal course and caliber with some calcified plaque. Normal caliber thoracic esophagus. Breathing motion. There is some linear opacity at the bases likely scar or atelectasis. Right-sided apical subpleural blebs. ABDOMEN/PELVIS: On the prior there is a large hypermetabolic mass centered in the area of the cervix extending into the right adnexa. Maximum SUV value previously of 25.9 and dimensions of 3.8 x 4.6 cm. Today, the abnormal uptake has essentially resolved with some minimal uptake of maximum SUV value of 2.8 in the area of the right-side of the cervix. There is some mild soft tissue thickening in this location on image 158. No discrete measurable mass lesion identified on the limited noncontrast CT. Otherwise there is physiologic distribution radiotracer along the parenchymal organs, bowel and renal collecting systems. No areas of abnormal nodal uptake identified this time. Incidental CT findings: On this limited noncontrast CT, the liver, adrenal glands and pancreas are unremarkable. Previous cholecystectomy. Spleen is grossly preserved. Scattered vascular calcifications identified along the aorta and branch vessels. Moderate atrophy of the right kidney. Mild left. The extrarenal pelvis seen to each kidney. Preserved contour to the urinary bladder. Moderate colonic stool. Scattered colonic diverticula. Normal appendix in the right lower quadrant with some high attenuation debris. Small bowel is nondilated. SKELETON: No specific abnormal uptake along the visualized osseous structures. Incidental CT findings: Scattered degenerative changes. IMPRESSION: Marked improvement in the area of previous hypermetabolic cervical mass. No significant residual uptake. No new areas of abnormal uptake. Scattered atherosclerotic changes including the coronary arteries. Please correlate for other coronary risk factors. Scattered colonic diverticula.  Electronically Signed   By: Ranell  Charlanne M.D.   On: 05/26/2024 11:56      06/17/2024 Procedure   Removal of implanted Port-A-Cath utilizing sharp and blunt dissection. The procedure was uncomplicated.     Interval History: Doing well.  Has occasional very light pink spotting either after she uses the dilator or sometimes when she has not.  Sees this when she wipes or will have a very light spot on her underwear.  Has occasional constipation, uses stool softener with good effect.  Denies any urinary symptoms.  Denies any abdominal or pelvic pain.  Past Medical/Surgical History: Past Medical History:  Diagnosis Date   Anemia    Anxiety    Blood dyscrasia    pantocytopenia   Cancer (HCC)    endocervical cancer   Depression    History of radiation therapy    Pelvis-12/11/23-02/24/24-Dr. Lynwood Nasuti   Hyperlipidemia     Past Surgical History:  Procedure Laterality Date   CHOLECYSTECTOMY     COLONOSCOPY  04/02/2012   Procedure: COLONOSCOPY;  Surgeon: Lamar CHRISTELLA Hollingshead, MD;  Location: [facility];  Service: Endoscopy;  Laterality: N/A;  11:30 AM   COLONOSCOPY N/A 10/22/2024   Procedure: COLONOSCOPY;  Surgeon: Hollingshead Lamar CHRISTELLA, MD;  Location: [facility];  Service: Endoscopy;  Laterality: N/A;  11:15AM, ASA 2   IR IMAGING GUIDED PORT INSERTION  12/11/2023   IR REMOVAL TUN ACCESS W/ PORT W/O FL MOD SED  06/17/2024   OPERATIVE ULTRASOUND N/A 01/26/2024   Procedure: US  INTRAOPERATIVE;  Surgeon: Nasuti Lynwood, MD;  Location: [facility];  Service: Urology;  Laterality: N/A;   OPERATIVE ULTRASOUND N/A 02/03/2024   Procedure: US  INTRAOPERATIVE;  Surgeon: Nasuti Lynwood, MD;  Location: [facility];  Service: Urology;  Laterality: N/A;   OPERATIVE ULTRASOUND N/A 02/10/2024   Procedure: US  INTRAOPERATIVE;  Surgeon: Nasuti Lynwood, MD;  Location: [facility];  Service: Urology;  Laterality: N/A;   OPERATIVE ULTRASOUND N/A 02/17/2024   Procedure: US  INTRAOPERATIVE;  Surgeon: Nasuti Lynwood, MD;  Location: [facility];   Service: Urology;  Laterality: N/A;   OPERATIVE ULTRASOUND N/A 02/24/2024   Procedure: US  INTRAOPERATIVE;  Surgeon: Nasuti Lynwood, MD;  Location: [facility];  Service: Urology;  Laterality: N/A;   POLYPECTOMY  10/22/2024   Procedure: POLYPECTOMY, INTESTINE;  Surgeon: Hollingshead Lamar CHRISTELLA, MD;  Location: [facility];  Service: Endoscopy;;   TANDEM RING INSERTION N/A 01/26/2024   Procedure: INSERTION, UTERINE TANDEM AND RING OR CYLINDER, FOR BRACHYTHERAPY;  Surgeon: Nasuti Lynwood, MD;  Location: [facility];  Service: Urology;  Laterality: N/A;   TANDEM RING INSERTION N/A 02/03/2024   Procedure: INSERTION, UTERINE TANDEM AND RING OR CYLINDER, FOR BRACHYTHERAPY;  Surgeon: Nasuti Lynwood, MD;  Location: [facility];  Service: Urology;  Laterality: N/A;   TANDEM RING INSERTION N/A 02/10/2024   Procedure: INSERTION, UTERINE TANDEM AND RING, FOR BRACHYTHERAPY;  Surgeon: Nasuti Lynwood, MD;  Location: [facility];  Service: Urology;  Laterality: N/A;   TANDEM RING INSERTION N/A 02/17/2024   Procedure: INSERTION, UTERINE TANDEM AND RING OR CYLINDER, FOR BRACHYTHERAPY;  Surgeon: Nasuti Lynwood, MD;  Location: [facility];  Service: Urology;  Laterality: N/A;   TANDEM RING INSERTION N/A 02/24/2024   Procedure: INSERTION, UTERINE TANDEM AND RING OR CYLINDER, FOR BRACHYTHERAPY;  Surgeon: Nasuti Lynwood, MD;  Location: [facility];  Service: Urology;  Laterality: N/A;    Family History  Problem Relation Age of Onset   Cancer Mother        Uterine/Endometrial   Endometrial cancer Mother  Cancer Brother        throat cancer   Throat cancer Brother    Ovarian cancer Neg Hx    Colon cancer Neg Hx    Pancreatic cancer Neg Hx    Breast cancer Neg Hx    Prostate cancer Neg Hx     Social History   Socioeconomic History   Marital status: Married    Spouse name: Not on file   Number of children: Not on file   Years of education: Not on file   Highest education level: Not on file  Occupational History   Not on file  Tobacco Use   Smoking  status: Never    Passive exposure: Past   Smokeless tobacco: Not on file  Vaping Use   Vaping status: Never Used  Substance and Sexual Activity   Alcohol use: No   Drug use: Not Currently   Sexual activity: Yes  Other Topics Concern   Not on file  Social History Narrative   Not on file   Social Drivers of Health   Tobacco Use: Unknown (12/10/2024)   Patient History    Smoking Tobacco Use: Never    Smokeless Tobacco Use: Unknown    Passive Exposure: Past  Financial Resource Strain: Not on file  Food Insecurity: No Food Insecurity (12/19/2023)   Hunger Vital Sign    Worried About Running Out of Food in the Last Year: Never true    Ran Out of Food in the Last Year: Never true  Transportation Needs: No Transportation Needs (12/19/2023)   PRAPARE - Administrator, Civil Service (Medical): No    Lack of Transportation (Non-Medical): No  Physical Activity: Not on file  Stress: Not on file  Social Connections: Not on file  Depression (PHQ2-9): Low Risk (09/02/2024)   Depression (PHQ2-9)    PHQ-2 Score: 0  Alcohol Screen: Not on file  Housing: Low Risk (12/19/2023)   Housing Stability Vital Sign    Unable to Pay for Housing in the Last Year: No    Number of Times Moved in the Last Year: 0    Homeless in the Last Year: No  Utilities: Not At Risk (12/03/2023)   [facility] Utilities    Threatened with loss of utilities: No  Health Literacy: Not on file    Current Medications: Current Medications[1]  Review of Systems: + ringing in ears, anxiety Denies appetite changes, fevers, chills, fatigue, unexplained weight changes. Denies hearing loss, neck lumps or masses, mouth sores or voice changes. Denies cough or wheezing.  Denies shortness of breath. Denies chest pain or palpitations. Denies leg swelling. Denies abdominal distention, pain, blood in stools, constipation, diarrhea, nausea, vomiting, or early satiety. Denies pain with intercourse, dysuria, frequency, hematuria  or incontinence. Denies hot flashes, pelvic pain, vaginal bleeding or vaginal discharge.   Denies joint pain, back pain or muscle pain/cramps. Denies itching, rash, or wounds. Denies dizziness, headaches, numbness or seizures. Denies swollen lymph nodes or glands, denies easy bruising or bleeding. Denies depression, confusion, or decreased concentration.  Physical Exam: BP 131/76 (BP Location: Right Leg, Patient Position: Sitting)   Pulse 86   Temp 98.2 F (36.8 C) (Oral)   Resp 19   Wt 128 lb 6.4 oz (58.2 kg)   SpO2 99%   BMI 25.08 kg/m  General: Alert, oriented, no acute distress. HEENT: Posterior oropharynx clear, sclera anicteric. Chest: Clear to auscultation bilaterally.  No wheezes or rhonchi. Cardiovascular: Regular rate and rhythm, no murmurs. Abdomen: soft,  nontender.  Normoactive bowel sounds.  No masses or hepatosplenomegaly appreciated.   Extremities: Grossly normal range of motion.  Warm, well perfused.  No edema bilaterally. Skin: No rashes or lesions noted. Lymphatics: No cervical, supraclavicular, or inguinal adenopathy. GU: Normal appearing external genitalia without erythema, excoriation, or lesions.  Speculum exam reveals moderate atrophy, radiation changes noted.  Cervix posterior facing with agglutination at the apex so that I cannot see the face of the cervix.  No visible lesions although small amount of spotting with manipulation of the speculum from atrophic areas where there is also radiation change.  Bimanual exam reveals cervix is soft, no firmness or nodularity.  Rectovaginal exam confirms these findings, no parametrial thickening.    Laboratory & Radiologic Studies: 05/2024: PET Marked improvement in the area of previous hypermetabolic cervical mass. No significant residual uptake. No new areas of abnormal uptake. Scattered atherosclerotic changes including the coronary arteries. Please correlate for other coronary risk factors. Scattered colonic  diverticula.  Assessment & Plan: Kelly Thomas is a 69 y.o. 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  Primary chemoRT completed in 02/2024. PDL1 CPS 95%.   Doing well. NED on exam. Pap and HPV testing performed today.   Encouraged regular dilator use.  Discussed trial of vaginal estrogen to see if this helps with her spotting that I suspect is related to atrophy.  Prescription sent to her pharmacy.   Plan for surveillance visits alternating every 6 months alternating between our office and radiation oncology. I will see the patient in 6 months for follow-up. Reviewed signs and symptoms that should prompt a phone call between visits.   20 minutes of total time was spent for this patient encounter, including preparation, face-to-face counseling with the patient and coordination of care, and documentation of the encounter.  Comer Dollar, MD  Division of Gynecologic Oncology  Department of Obstetrics and Gynecology  [facility]      [1]  Current Outpatient Medications:    atorvastatin (LIPITOR) 10 MG tablet, Take 10 mg by mouth every Monday, Wednesday, and Friday at 8 PM., Disp: , Rfl:    Calcium Carbonate-Vitamin D (CALCIUM-VITAMIN D PO), Take 1 tablet by mouth daily., Disp: , Rfl:    Cholecalciferol (VITAMIN D) 50 MCG (2000 UT) tablet, Take 2,000 Units by mouth daily., Disp: , Rfl:    [START ON 12/13/2024] conjugated estrogens (PREMARIN) vaginal cream, Place 1 Applicatorful vaginally 2 (two) times a week. Use a small amount on your finger and inserted this into the vagina twice a week at night.  You can do this after you use your vaginal dilator., Disp: 42.5 g, Rfl: 1   Multiple Vitamin (MULTIVITAMIN) tablet, Take 1 tablet by mouth daily., Disp: , Rfl:

## 2024-12-16 ENCOUNTER — Ambulatory Visit: Payer: Self-pay | Admitting: Gynecologic Oncology

## 2024-12-16 LAB — CYTOLOGY - PAP
Comment: NEGATIVE
Diagnosis: NEGATIVE
Diagnosis: REACTIVE
High risk HPV: NEGATIVE

## 2024-12-17 NOTE — Telephone Encounter (Signed)
-----   Message from Comer Dollar, MD sent at 12/16/2024  5:11 PM EST ----- Please let the patient know that pap is normal/negative, HPV is negative. Thanks!

## 2024-12-17 NOTE — Telephone Encounter (Signed)
 Spoke with Kelly Thomas and relayed result message from Dr. Viktoria. Patient's pap is normal/negative, HPV is negative. Pt verbalized understanding and was very thankful for the good news!

## 2025-02-24 ENCOUNTER — Ambulatory Visit: Payer: Self-pay | Admitting: Radiation Oncology

## 2025-03-04 ENCOUNTER — Ambulatory Visit

## 2025-06-03 ENCOUNTER — Inpatient Hospital Stay: Admitting: Gynecologic Oncology
